# Patient Record
Sex: Female | Born: 1940 | Race: White | Hispanic: No
Health system: Southern US, Community
[De-identification: ages and names within clinical notes are randomized; demographics above are authoritative.]

## PROBLEM LIST (undated history)

## (undated) DIAGNOSIS — C50919 Malignant neoplasm of unspecified site of unspecified female breast: Secondary | ICD-10-CM

## (undated) DIAGNOSIS — K219 Gastro-esophageal reflux disease without esophagitis: Secondary | ICD-10-CM

## (undated) DIAGNOSIS — I499 Cardiac arrhythmia, unspecified: Secondary | ICD-10-CM

## (undated) DIAGNOSIS — J849 Interstitial pulmonary disease, unspecified: Secondary | ICD-10-CM

## (undated) DIAGNOSIS — I4819 Other persistent atrial fibrillation: Secondary | ICD-10-CM

## (undated) DIAGNOSIS — E785 Hyperlipidemia, unspecified: Secondary | ICD-10-CM

## (undated) DIAGNOSIS — Z87440 Personal history of urinary (tract) infections: Secondary | ICD-10-CM

## (undated) DIAGNOSIS — I10 Essential (primary) hypertension: Secondary | ICD-10-CM

## (undated) DIAGNOSIS — Z8679 Personal history of other diseases of the circulatory system: Secondary | ICD-10-CM

## (undated) DIAGNOSIS — I7300 Raynaud's syndrome without gangrene: Secondary | ICD-10-CM

## (undated) DIAGNOSIS — D509 Iron deficiency anemia, unspecified: Secondary | ICD-10-CM

## (undated) DIAGNOSIS — M4800 Spinal stenosis, site unspecified: Secondary | ICD-10-CM

## (undated) DIAGNOSIS — Z77090 Contact with and (suspected) exposure to asbestos: Secondary | ICD-10-CM

## (undated) DIAGNOSIS — J309 Allergic rhinitis, unspecified: Secondary | ICD-10-CM

## (undated) DIAGNOSIS — Z8719 Personal history of other diseases of the digestive system: Secondary | ICD-10-CM

## (undated) DIAGNOSIS — R0600 Dyspnea, unspecified: Secondary | ICD-10-CM

## (undated) HISTORY — DX: Gastro-esophageal reflux disease without esophagitis: K21.9

## (undated) HISTORY — PX: APPENDECTOMY: SHX54

## (undated) HISTORY — PX: OTHER SURGICAL HISTORY: SHX169

## (undated) HISTORY — PX: TONSILLECTOMY: SUR1361

## (undated) HISTORY — PX: GANGLION CYST EXCISION: SHX1691

## (undated) HISTORY — PX: HEMORRHOID SURGERY: SHX153

## (undated) HISTORY — DX: Allergic rhinitis, unspecified: J30.9

---

## 2005-04-10 HISTORY — PX: BREAST LUMPECTOMY: SHX2

## 2005-04-10 HISTORY — PX: BREAST SURGERY: SHX581

## 2007-04-11 HISTORY — PX: ABLATION: SHX5711

## 2010-10-06 HISTORY — PX: OTHER SURGICAL HISTORY: SHX169

## 2014-06-11 ENCOUNTER — Other Ambulatory Visit: Payer: Self-pay

## 2014-06-11 DIAGNOSIS — Z1231 Encounter for screening mammogram for malignant neoplasm of breast: Secondary | ICD-10-CM

## 2014-06-11 DIAGNOSIS — Z9889 Other specified postprocedural states: Secondary | ICD-10-CM

## 2014-06-15 ENCOUNTER — Telehealth: Payer: Self-pay | Admitting: *Deleted

## 2014-06-15 NOTE — Telephone Encounter (Signed)
Called and confirmed 06/25/14 appt w/ pt.  Mailed before appt letter, calendar, welcoming packet & intake form to pt.  Called Tammy at referring to make her aware.  Placed chart in Dr. Geralyn Flash box and took a copy of the records to HIM to scan.

## 2014-06-18 ENCOUNTER — Encounter (INDEPENDENT_AMBULATORY_CARE_PROVIDER_SITE_OTHER): Payer: Self-pay

## 2014-06-18 ENCOUNTER — Ambulatory Visit
Admission: RE | Admit: 2014-06-18 | Discharge: 2014-06-18 | Disposition: A | Discharge status: Home / Self Care | Payer: Medicare Other | Source: Ambulatory Visit

## 2014-06-18 DIAGNOSIS — Z1231 Encounter for screening mammogram for malignant neoplasm of breast: Secondary | ICD-10-CM

## 2014-06-18 DIAGNOSIS — Z9889 Other specified postprocedural states: Secondary | ICD-10-CM

## 2014-06-25 ENCOUNTER — Ambulatory Visit: Payer: Medicare Other

## 2014-06-25 ENCOUNTER — Ambulatory Visit: Payer: Self-pay | Admitting: Hematology and Oncology

## 2014-06-25 ENCOUNTER — Ambulatory Visit (HOSPITAL_BASED_OUTPATIENT_CLINIC_OR_DEPARTMENT_OTHER): Payer: Medicare Other | Admitting: Hematology and Oncology

## 2014-06-25 ENCOUNTER — Telehealth: Payer: Self-pay | Admitting: Hematology and Oncology

## 2014-06-25 ENCOUNTER — Ambulatory Visit: Payer: Self-pay

## 2014-06-25 VITALS — BP 141/70 | HR 75 | Temp 97.6°F | Resp 18 | Ht 64.0 in | Wt 166.9 lb

## 2014-06-25 DIAGNOSIS — Z853 Personal history of malignant neoplasm of breast: Secondary | ICD-10-CM

## 2014-06-25 DIAGNOSIS — C50412 Malignant neoplasm of upper-outer quadrant of left female breast: Secondary | ICD-10-CM | POA: Insufficient documentation

## 2014-06-25 DIAGNOSIS — C50912 Malignant neoplasm of unspecified site of left female breast: Secondary | ICD-10-CM

## 2014-06-25 NOTE — Telephone Encounter (Signed)
Gave patient avs report and appointment for march 2017

## 2014-06-25 NOTE — Addendum Note (Signed)
Addended by: Renford Dills on: 06/25/2014 01:43 PM   Modules accepted: Medications

## 2014-06-25 NOTE — Assessment & Plan Note (Signed)
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 Currently on observation  Breast cancer surveillance: 1. Breast exam 06/25/2014 is normal 2. Mammogram 06/19/2014 is normal  Survivorship:Discussed the importance of physical exercise in decreasing the likelihood of breast cancer recurrence. Recommended 30 mins daily 6 days a week of either brisk walking or cycling or swimming. Encouraged patient to eat more fruits and vegetables and decrease red meat.   Return to clinic in 1 year for follow-up

## 2014-06-25 NOTE — Progress Notes (Signed)
[facility] CONSULT NOTE  Patient Care Team: No Pcp Per Patient as PCP - General (General Practice)  CHIEF COMPLAINTS/PURPOSE OF CONSULTATION:  Prior history of left breast cancer. Establish care in [name]:  Karina Tran 74 y.o. female is here because of a prior diagnosis of left breast cancer in December 2006 she underwent lumpectomy followed by radiation and 5 years of hormonal therapy. She undergone this treatment in [place]. Her family moved to this area and she wanted to see [place] to establish oncology care. She undergone a mammogram on March 11 which was normal. She reports no new problems or concerns with the breast.  I reviewed her records extensively and collaborated the history with the patient.  SUMMARY OF ONCOLOGIC HISTORY:   Breast cancer, left breast   03/24/2005 Surgery Left breast lumpectomy 1.5cm tumor, grade 2, 2 sentinel nodes negative, ER 83% PR 0% HER-2 negative ratio 0.7, Ki-67 0% T1c N0 M0 stage IA   04/26/2005 - 06/02/2005 Radiation Therapy Adjuvant radiation therapy   06/26/2005 - 06/24/2010 Anti-estrogen oral therapy Adjuvant antiestrogen therapy with Arimidex 1 mg daily 5 years    In terms of breast cancer risk profile:  She menarched at early age of 35 and went to menopause at age 13  She had 3 pregnancy, her first child was born at age 14  She has not received birth control pills.  She was never exposed to fertility medications or hormone replacement therapy.  She has no family history of Breast/GYN/GI cancer  MEDICAL HISTORY:  No past medical history on file.  SURGICAL HISTORY: No past surgical history on file.  SOCIAL HISTORY: History   Social History  . Marital Status: Widowed    Spouse Name: N/A  . Number of Children: N/A  . Years of Education: N/A   Occupational History  . Not on file.   Social History Main Topics  . Smoking status: Not on file  . Smokeless tobacco: Not on file   . Alcohol Use: Not on file  . Drug Use: Not on file  . Sexual Activity: Not on file   Other Topics Concern  . Not on file   Social History Narrative  . No narrative on file    FAMILY HISTORY: No family history on file.  ALLERGIES:  has no allergies on file.  MEDICATIONS:  No current outpatient prescriptions on file.   No current facility-administered medications for this visit.    REVIEW OF SYSTEMS:   Constitutional: Denies fevers, chills or abnormal night sweats Eyes: Denies blurriness of vision, double vision or watery eyes Ears, nose, mouth, throat, and face: Denies mucositis or sore throat Respiratory: Denies cough, dyspnea or wheezes Cardiovascular: Denies palpitation, chest discomfort or lower extremity swelling Gastrointestinal:  Denies nausea, heartburn or change in bowel habits Skin: Denies abnormal skin rashes Lymphatics: Denies new lymphadenopathy or easy bruising Neurological:Denies numbness, tingling or new weaknesses Behavioral/Psych: Mood is stable, no new changes  Breast:  Denies any palpable lumps or discharge All other systems were reviewed with the patient and are negative.  PHYSICAL EXAMINATION: ECOG PERFORMANCE STATUS: 0 - Asymptomatic  Filed Vitals:   06/25/14 1256  BP: 141/70  Pulse: 75  Temp: 97.6 F (36.4 C)  Resp: 18   Filed Weights   06/25/14 1256  Weight: 166 lb 14.4 oz (75.705 kg)    GENERAL:alert, no distress and comfortable SKIN: skin color, texture, turgor are normal, no rashes or significant lesions EYES: normal,  conjunctiva are pink and non-injected, sclera clear OROPHARYNX:no exudate, no erythema and lips, buccal mucosa, and tongue normal  NECK: supple, thyroid normal size, non-tender, without nodularity LYMPH:  no palpable lymphadenopathy in the cervical, axillary or inguinal LUNGS: clear to auscultation and percussion with normal breathing effort HEART: regular rate & rhythm and no murmurs and no lower extremity  edema ABDOMEN:abdomen soft, non-tender and normal bowel sounds Musculoskeletal:no cyanosis of digits and no clubbing  PSYCH: alert & oriented x 3 with fluent speech NEURO: no focal motor/sensory deficits BREAST: No palpable nodules in breast. No palpable axillary or supraclavicular lymphadenopathy (exam performed in the presence of a chaperone)   RADIOGRAPHIC STUDIES: I have personally reviewed the radiological reports and agreed with the findings in the report. 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 Currently on observation  Breast cancer surveillance: 1. Breast exam 06/25/2014 is normal 2. Mammogram 06/19/2014 is normal 3. I discussed with her that there is no role of routine imaging on routine blood work in surveillance for breast cancer based on the NCCN guidelines.  Survivorship:Discussed the importance of physical exercise in decreasing the likelihood of breast cancer recurrence. Recommended 30 mins daily 6 days a week of either brisk walking or cycling or swimming. Encouraged patient to eat more fruits and vegetables and decrease red meat.   Return to clinic in 1 year for follow-up  All questions were answered. The patient knows to call the clinic with any problems, questions or concerns.    Rulon Eisenmenger, MD 1:33 PM

## 2014-11-16 ENCOUNTER — Encounter (HOSPITAL_COMMUNITY): Payer: Self-pay | Admitting: Emergency Medicine

## 2014-11-16 ENCOUNTER — Inpatient Hospital Stay (HOSPITAL_COMMUNITY)
Admission: EM | Admit: 2014-11-16 | Discharge: 2014-11-18 | DRG: 310 | Disposition: A | Discharge status: Home / Self Care | Payer: Medicare Other | Attending: Cardiology | Admitting: Cardiology

## 2014-11-16 ENCOUNTER — Emergency Department (HOSPITAL_COMMUNITY): Payer: Medicare Other

## 2014-11-16 DIAGNOSIS — Z885 Allergy status to narcotic agent status: Secondary | ICD-10-CM

## 2014-11-16 DIAGNOSIS — I959 Hypotension, unspecified: Secondary | ICD-10-CM | POA: Diagnosis present

## 2014-11-16 DIAGNOSIS — I10 Essential (primary) hypertension: Secondary | ICD-10-CM | POA: Diagnosis present

## 2014-11-16 DIAGNOSIS — I4891 Unspecified atrial fibrillation: Secondary | ICD-10-CM | POA: Diagnosis present

## 2014-11-16 DIAGNOSIS — I4892 Unspecified atrial flutter: Secondary | ICD-10-CM | POA: Diagnosis present

## 2014-11-16 DIAGNOSIS — E785 Hyperlipidemia, unspecified: Secondary | ICD-10-CM | POA: Diagnosis present

## 2014-11-16 DIAGNOSIS — Z888 Allergy status to other drugs, medicaments and biological substances status: Secondary | ICD-10-CM

## 2014-11-16 DIAGNOSIS — Z853 Personal history of malignant neoplasm of breast: Secondary | ICD-10-CM

## 2014-11-16 DIAGNOSIS — Z79899 Other long term (current) drug therapy: Secondary | ICD-10-CM

## 2014-11-16 HISTORY — DX: Essential (primary) hypertension: I10

## 2014-11-16 HISTORY — DX: Malignant neoplasm of unspecified site of unspecified female breast: C50.919

## 2014-11-16 HISTORY — DX: Other persistent atrial fibrillation: I48.19

## 2014-11-16 HISTORY — DX: Personal history of urinary (tract) infections: Z87.440

## 2014-11-16 LAB — CBC
HEMATOCRIT: 38.2 % (ref 36.0–46.0)
Hemoglobin: 12.3 g/dL (ref 12.0–15.0)
MCH: 25.2 pg — AB (ref 26.0–34.0)
MCHC: 32.2 g/dL (ref 30.0–36.0)
MCV: 78.3 fL (ref 78.0–100.0)
Platelets: 256 10*3/uL (ref 150–400)
RBC: 4.88 MIL/uL (ref 3.87–5.11)
RDW: 18.4 % — ABNORMAL HIGH (ref 11.5–15.5)
WBC: 9.5 10*3/uL (ref 4.0–10.5)

## 2014-11-16 LAB — BASIC METABOLIC PANEL
Anion gap: 13 (ref 5–15)
CO2: 23 mmol/L (ref 22–32)
Calcium: 9.5 mg/dL (ref 8.9–10.3)
Chloride: 96 mmol/L — ABNORMAL LOW (ref 101–111)
Creatinine, Ser: 0.97 mg/dL (ref 0.44–1.00)
GFR, EST NON AFRICAN AMERICAN: 56 mL/min — AB (ref >60)
GLUCOSE: 125 mg/dL — AB (ref 65–99)
Potassium: 3.5 mmol/L (ref 3.5–5.1)
Sodium: 132 mmol/L — ABNORMAL LOW (ref 135–145)

## 2014-11-16 MED ORDER — DILTIAZEM LOAD VIA INFUSION
20.0000 mg | Freq: Once | INTRAVENOUS | Status: AC
  Administered 2014-11-17: 20 mg via INTRAVENOUS
  Filled 2014-11-16: qty 20

## 2014-11-16 MED ORDER — DILTIAZEM HCL 100 MG IV SOLR
5.0000 mg/h | INTRAVENOUS | Status: DC
  Administered 2014-11-17: 5 mg/h via INTRAVENOUS
  Filled 2014-11-16 (×2): qty 100

## 2014-11-16 NOTE — ED Notes (Signed)
EDP at bedside  

## 2014-11-16 NOTE — ED Notes (Signed)
Pt was waiting in the waiting room with a family member when she suddenly became very sob. Pt with hx of a-fib. Takes medication for this. Pt presents in a-flutter rate in the 120's. Denies cp.

## 2014-11-16 NOTE — ED Provider Notes (Signed)
CSN: [id number]     Arrival date & time 11/16/14  2223 History   This chart was scribed for Karina Fuel, MD by Forrestine Him, ED Scribe. This patient was seen in room D33C/D33C and the patient's care was started 11:29 PM.   Chief Complaint  Patient presents with  . Atrial Fibrillation   The history is provided by the patient. No language interpreter was used.    HPI Comments: Karina Tran is a 74 y.o. female with a PMHx of A-Fib, HTN, and breast cancer who presents to the Emergency Department here for Atrial Fibrillation this evening. She reports constant, ongoing shortness of breath that is worsened at rest and with movement, mild chest pressure, and feeling as through "my chest is jumping out of my chest" onset 2 hours. She admits to a history of same but denies any recent episodes of A-Fib. No recent fever, chills, nausea, or vomiting. Last episode of A-Fib required cardioversion which put her right back into rhythm. In addition, PSHx includes cardiac ablation. Karina Tran is currently on daily medications for A-Fib. She is also followed by Dr. Einar Gip. Pt with known allergies to ASA and Demerol.  Past Medical History  Diagnosis Date  . A-fib   . Hypertension   . S/P ablation of atrial fibrillation   . Cancer     Breast    Past Surgical History  Procedure Laterality Date  . Appendectomy    . Cesarean section    . Carpel tunnel     No family history on file. History  Substance Use Topics  . Smoking status: Never Smoker   . Smokeless tobacco: Not on file  . Alcohol Use: Yes   OB History    No data available     Review of Systems  Constitutional: Negative for fever and chills.  Respiratory: Positive for shortness of breath. Negative for cough.   Cardiovascular: Positive for chest pain.  Gastrointestinal: Negative for nausea, vomiting and abdominal pain.  Neurological: Negative for headaches.  Psychiatric/Behavioral: Negative for confusion.  All other systems reviewed  and are negative.     Allergies  Aspirin and Demerol  Home Medications   Prior to Admission medications   Medication Sig Start Date End Date Taking? Authorizing Provider  amLODipine (NORVASC) 2.5 MG tablet Take 2.5 mg by mouth daily.    Historical Provider, MD  atenolol (TENORMIN) 50 MG tablet Take 50 mg by mouth daily.    Historical Provider, MD  Coenzyme Q10 (COQ10) 200 MG CAPS Take by mouth daily.    Historical Provider, MD  hydrochlorothiazide (HYDRODIURIL) 25 MG tablet Take 25 mg by mouth daily.    Historical Provider, MD  pantoprazole (PROTONIX) 40 MG tablet Take 40 mg by mouth daily.    Historical Provider, MD  pravastatin (PRAVACHOL) 10 MG tablet Take 10 mg by mouth daily.    Historical Provider, MD  UNABLE TO FIND daily. Calcium 600 mg with Vitamin D3    Historical Provider, MD   Triage Vitals: BP 140/87 mmHg  Pulse 125  Temp(Src) 97.5 F (36.4 C)  Resp 20  Ht 5\' 3"  (1.6 m)  Wt 156 lb 9.6 oz (71.033 kg)  BMI 27.75 kg/m2  SpO2 100%   Physical Exam  Constitutional: She is oriented to person, place, and time. She appears well-developed and well-nourished.  HENT:  Head: Normocephalic and atraumatic.  Eyes: EOM are normal. Pupils are equal, round, and reactive to light.  Neck: Normal range of motion. Neck supple. No  JVD present.  Cardiovascular: Regular rhythm and normal heart sounds.  Tachycardia present.   No murmur heard. Pulmonary/Chest: Effort normal and breath sounds normal. She has no wheezes. She has no rales. She exhibits no tenderness.  Abdominal: Soft. Bowel sounds are normal. She exhibits no distension and no mass. There is no tenderness.  Musculoskeletal: Normal range of motion. She exhibits no edema.  Lymphadenopathy:    She has no cervical adenopathy.  Neurological: She is alert and oriented to person, place, and time. No cranial nerve deficit. She exhibits normal muscle tone. Coordination normal.  Skin: Skin is warm and dry. No rash noted.   Psychiatric: She has a normal mood and affect. Her behavior is normal. Judgment and thought content normal.  Nursing note and vitals reviewed.   ED Course  Procedures (including critical care time)  DIAGNOSTIC STUDIES: Oxygen Saturation is 100% on RA, Normal by my interpretation.    COORDINATION OF CARE: 11:38 PM- Will give Cardizem drip. Will order CXR, Troponin I, BMP, and CBC. Discussed treatment plan with pt at bedside and pt agreed to plan.     Labs Review Results for orders placed or performed during the hospital encounter of 38/75/64  Basic metabolic panel  Result Value Ref Range   Sodium 132 (L) 135 - 145 mmol/L   Potassium 3.5 3.5 - 5.1 mmol/L   Chloride 96 (L) 101 - 111 mmol/L   CO2 23 22 - 32 mmol/L   Glucose, Bld 125 (H) 65 - 99 mg/dL   BUN <5 (L) 6 - 20 mg/dL   Creatinine, Ser 0.97 0.44 - 1.00 mg/dL   Calcium 9.5 8.9 - 10.3 mg/dL   GFR calc non Af Amer 56 (L) >60 mL/min   GFR calc Af Amer >60 >60 mL/min   Anion gap 13 5 - 15  CBC  Result Value Ref Range   WBC 9.5 4.0 - 10.5 K/uL   RBC 4.88 3.87 - 5.11 MIL/uL   Hemoglobin 12.3 12.0 - 15.0 g/dL   HCT 38.2 36.0 - 46.0 %   MCV 78.3 78.0 - 100.0 fL   MCH 25.2 (L) 26.0 - 34.0 pg   MCHC 32.2 30.0 - 36.0 g/dL   RDW 18.4 (H) 11.5 - 15.5 %   Platelets 256 150 - 400 K/uL  Troponin I  Result Value Ref Range   Troponin I <0.03 <0.031 ng/mL   Imaging Review Dg Chest 2 View  11/16/2014   CLINICAL DATA:  Acute onset of shortness of breath. Initial encounter.  EXAM: CHEST  2 VIEW  COMPARISON:  None.  FINDINGS: The lungs are well-aerated. Mild left basilar opacity may reflect atelectasis or mild pneumonia. Peribronchial thickening is seen. There is no evidence of pleural effusion or pneumothorax.  The heart is mildly enlarged. No acute osseous abnormalities are seen.  IMPRESSION: Mild left basilar opacity may reflect atelectasis or mild pneumonia. Peribronchial thickening seen. Mild cardiomegaly.   Electronically Signed   By:  Garald Balding M.D.   On: 11/16/2014 23:31     EKG Interpretation   Date/Time:  Monday November 16 2014 22:31:26 EDT Ventricular Rate:  127 PR Interval:    QRS Duration: 80 QT Interval:  330 QTC Calculation: 479 R Axis:   84 Text Interpretation:  Atrial flutter with variable A-V block with  premature ventricular or aberrantly conducted complexes Nonspecific ST  abnormality Abnormal ECG No old tracing to compare Confirmed by [facility]  MD,  Anokhi Shannon ([id number]) on 11/16/2014 11:25:34 PM  CRITICAL CARE Performed by: Karina Tran Total critical care time: 50 minutes Critical care time was exclusive of separately billable procedures and treating other patients. Critical care was necessary to treat or prevent imminent or life-threatening deterioration. Critical care was time spent personally by me on the following activities: development of treatment plan with patient and/or surrogate as well as nursing, discussions with consultants, evaluation of patient's response to treatment, examination of patient, obtaining history from patient or surrogate, ordering and performing treatments and interventions, ordering and review of laboratory studies, ordering and review of radiographic studies, pulse oximetry and re-evaluation of patient's condition.  MDM   Final diagnoses:  Atrial flutter with rapid ventricular response    Atrial flutter with rapid ventricular response. She was given diltiazem bolus and drip with rate slowing to the 80s and what appeared to be alternating 2:1 and 3:1 AV block. However, she continued to complain of chest discomfort in spite of the rate dropping. Case was discussed with Dr. Einar Gip who agrees to admit the patient and requests that she be started on rivaroxaban  I personally performed the services described in this documentation, which was scribed in my presence. The recorded information has been reviewed and is accurate.     Karina Fuel, MD 07/68/08 [id number]

## 2014-11-17 ENCOUNTER — Encounter (HOSPITAL_COMMUNITY): Payer: Self-pay | Admitting: General Practice

## 2014-11-17 DIAGNOSIS — I4892 Unspecified atrial flutter: Secondary | ICD-10-CM | POA: Diagnosis present

## 2014-11-17 LAB — TROPONIN I
Troponin I: <0.03 ng/mL (ref <0.031)
Troponin I: <0.03 ng/mL (ref <0.031)

## 2014-11-17 LAB — TSH: TSH: 2.275 u[IU]/mL (ref 0.350–4.500)

## 2014-11-17 MED ORDER — ZOLPIDEM TARTRATE 5 MG PO TABS
5.0000 mg | ORAL_TABLET | Freq: Every evening | ORAL | Status: DC | PRN
  Administered 2014-11-17: 5 mg via ORAL
  Filled 2014-11-17: qty 1

## 2014-11-17 MED ORDER — LORAZEPAM 2 MG/ML IJ SOLN
0.5000 mg | Freq: Once | INTRAMUSCULAR | Status: DC
  Filled 2014-11-17: qty 1

## 2014-11-17 MED ORDER — SODIUM CHLORIDE 0.9 % IV SOLN
250.0000 mL | INTRAVENOUS | Status: DC
  Administered 2014-11-17: 250 mL via INTRAVENOUS
  Administered 2014-11-18: 500 mL via INTRAVENOUS

## 2014-11-17 MED ORDER — SODIUM CHLORIDE 0.9 % IJ SOLN
3.0000 mL | INTRAMUSCULAR | Status: DC | PRN
  Administered 2014-11-17: 3 mL via INTRAVENOUS

## 2014-11-17 MED ORDER — RIVAROXABAN 20 MG PO TABS
20.0000 mg | ORAL_TABLET | Freq: Once | ORAL | Status: AC
  Administered 2014-11-17: 20 mg via ORAL
  Filled 2014-11-17: qty 1

## 2014-11-17 MED ORDER — ATENOLOL 50 MG PO TABS
50.0000 mg | ORAL_TABLET | Freq: Two times a day (BID) | ORAL | Status: DC
Start: 2014-11-17 — End: 2014-11-18
  Administered 2014-11-17 – 2014-11-18 (×3): 50 mg via ORAL
  Filled 2014-11-17 (×4): qty 1

## 2014-11-17 MED ORDER — RIVAROXABAN 20 MG PO TABS
20.0000 mg | ORAL_TABLET | Freq: Every day | ORAL | Status: DC
  Administered 2014-11-17 – 2014-11-18 (×2): 20 mg via ORAL
  Filled 2014-11-17 (×2): qty 1

## 2014-11-17 MED ORDER — NITROGLYCERIN 0.4 MG SL SUBL: 0.4000 mg | SUBLINGUAL_TABLET | SUBLINGUAL | Status: DC | PRN

## 2014-11-17 MED ORDER — SODIUM CHLORIDE 0.9 % IJ SOLN
3.0000 mL | Freq: Two times a day (BID) | INTRAMUSCULAR | Status: DC
  Administered 2014-11-17 – 2014-11-18 (×3): 3 mL via INTRAVENOUS

## 2014-11-17 MED ORDER — PANTOPRAZOLE SODIUM 40 MG PO TBEC
40.0000 mg | DELAYED_RELEASE_TABLET | Freq: Every day | ORAL | Status: DC
  Administered 2014-11-17 – 2014-11-18 (×2): 40 mg via ORAL
  Filled 2014-11-17 (×2): qty 1

## 2014-11-17 MED ORDER — PRAVASTATIN SODIUM 10 MG PO TABS
10.0000 mg | ORAL_TABLET | Freq: Every day | ORAL | Status: DC
  Administered 2014-11-17 – 2014-11-18 (×2): 10 mg via ORAL
  Filled 2014-11-17 (×2): qty 1

## 2014-11-17 MED ORDER — FLECAINIDE ACETATE 50 MG PO TABS
50.0000 mg | ORAL_TABLET | Freq: Two times a day (BID) | ORAL | Status: DC
  Administered 2014-11-17 – 2014-11-18 (×3): 50 mg via ORAL
  Filled 2014-11-17 (×5): qty 1

## 2014-11-17 NOTE — ED Notes (Signed)
Meal tray ordered 

## 2014-11-17 NOTE — ED Notes (Signed)
Requested medication from pharmacy.

## 2014-11-17 NOTE — ED Notes (Signed)
Pt ambulated to the restroom.

## 2014-11-17 NOTE — ED Notes (Signed)
ED Doctor at bedside.  

## 2014-11-17 NOTE — ED Notes (Signed)
Sent med request to pharmacy.

## 2014-11-17 NOTE — ED Notes (Signed)
Pt given menu to order breakfasat; pt unable to sleep or rest even after [place]

## 2014-11-17 NOTE — H&P (Addendum)
Karina Tran is an 74 y.o. female.   Chief Complaint: A.flutter HPI: Karina Tran is a 74 y.o. female with a history of a.fib s/p ablation in 2012, hypertension, and hyperlipidemia who presented to the ED last night with symptoms suggestive of recurrent a.fib.  She reported extreme fatigue, dyspnea with just minimal exertion, and heart pounding. Symptoms started at around 5 PM yesterday, with debris physical activity she had to rest. Last evening she brought her daughter to the emergency room for severe nausea and vomiting, once she was walking her daughter into the emergency room, patient herself started feeling ill, markedly dyspneic, palpitations and dizziness associated with chest heaviness. She reports a sensation of chest heaviness but denies pain. She then presented to the emergency room for evaluation. Denies any new medications, prescription or OTC.  Pt reports mild chest heaviness which is resolved since admission presently but denies symptoms except for irregular heart rhythm.  Past Medical History  Diagnosis Date  . A-fib   . Hypertension   . S/P ablation of atrial fibrillation   . Cancer     Breast     Past Surgical History  Procedure Laterality Date  . Appendectomy    . Cesarean section    . Carpel tunnel      No family history on file. Social History:  reports that she has never smoked. She does not have any smokeless tobacco history on file. She reports that she drinks alcohol. She reports that she does not use illicit drugs.  Allergies:  Allergies  Allergen Reactions  . Aspirin     Bloody noses  . Demerol [Meperidine]     vomiting    Review of Systems - History obtained from the patient General ROS: positive for  - fatigue negative for - fever, malaise, weight gain or weight loss Respiratory ROS: positive for - shortness of breath negative for - cough or orthopnea Cardiovascular ROS: positive for - chest pain, dyspnea on exertion and rapid heart  rate negative for - edema, loss of consciousness or paroxysmal nocturnal dyspnea Neurological ROS: no TIA or stroke symptoms  Blood pressure 97/50, pulse 63, temperature 97.5 F (36.4 C), resp. rate 19, height $RemoveBe'5\' 3"'iJjreMtmT$  (1.6 m), weight 71.033 kg (156 lb 9.6 oz), SpO2 100 %. General appearance: alert, cooperative, appears stated age and no distress Eyes: negative Neck: no adenopathy, no carotid bruit, no JVD, supple, symmetrical, trachea midline and thyroid not enlarged, symmetric, no tenderness/mass/nodules Resp: clear to auscultation bilaterally Chest wall: no tenderness Cardio: regular rate and rhythm, S1, S2 normal, no murmur, click, rub or gallop S2 appears to be widely split with normal respiratory variation. GI: soft, non-tender; bowel sounds normal; no masses,  no organomegaly Extremities: extremities normal, atraumatic, no cyanosis or edema Pulses: 2+ and symmetric Skin: Skin color, texture, turgor normal. No rashes or lesions Neurologic: Grossly normal  Results for orders placed or performed during the hospital encounter of 11/16/14 (from the past 48 hour(s))  Basic metabolic panel     Status: Abnormal   Collection Time: 11/16/14 10:44 PM  Result Value Ref Range   Sodium 132 (L) 135 - 145 mmol/L   Potassium 3.5 3.5 - 5.1 mmol/L   Chloride 96 (L) 101 - 111 mmol/L   CO2 23 22 - 32 mmol/L   Glucose, Bld 125 (H) 65 - 99 mg/dL   BUN <5 (L) 6 - 20 mg/dL   Creatinine, Ser 0.97 0.44 - 1.00 mg/dL   Calcium 9.5 8.9 - 10.3  mg/dL   GFR calc non Af Amer 56 (L) >60 mL/min   GFR calc Af Amer >60 >60 mL/min    Comment: (NOTE) The eGFR has been calculated using the CKD EPI equation. This calculation has not been validated in all clinical situations. eGFR's persistently <60 mL/min signify possible Chronic Kidney Disease.    Anion gap 13 5 - 15  CBC     Status: Abnormal   Collection Time: 11/16/14 10:44 PM  Result Value Ref Range   WBC 9.5 4.0 - 10.5 K/uL   RBC 4.88 3.87 - 5.11 MIL/uL    Hemoglobin 12.3 12.0 - 15.0 g/dL   HCT 38.2 36.0 - 46.0 %   MCV 78.3 78.0 - 100.0 fL   MCH 25.2 (L) 26.0 - 34.0 pg   MCHC 32.2 30.0 - 36.0 g/dL   RDW 18.4 (H) 11.5 - 15.5 %   Platelets 256 150 - 400 K/uL  Troponin I     Status: None   Collection Time: 11/17/14 12:12 AM  Result Value Ref Range   Troponin I <0.03 <0.031 ng/mL    Comment:        NO INDICATION OF MYOCARDIAL INJURY.    Dg Chest 2 View  11/16/2014   CLINICAL DATA:  Acute onset of shortness of breath. Initial encounter.  EXAM: CHEST  2 VIEW  COMPARISON:  None.  FINDINGS: The lungs are well-aerated. Mild left basilar opacity may reflect atelectasis or mild pneumonia. Peribronchial thickening is seen. There is no evidence of pleural effusion or pneumothorax.  The heart is mildly enlarged. No acute osseous abnormalities are seen.  IMPRESSION: Mild left basilar opacity may reflect atelectasis or mild pneumonia. Peribronchial thickening seen. Mild cardiomegaly.   Electronically Signed   By: Garald Balding M.D.   On: 11/16/2014 23:31    Labs:   Lab Results  Component Value Date   WBC 9.5 11/16/2014   HGB 12.3 11/16/2014   HCT 38.2 11/16/2014   MCV 78.3 11/16/2014   PLT 256 11/16/2014    Recent Labs Lab 11/16/14 2244  NA 132*  K 3.5  CL 96*  CO2 23  BUN <5*  CREATININE 0.97  CALCIUM 9.5  GLUCOSE 125*    Recent Labs  11/17/14 0012  TROPONINI <0.03    Lab Results  Component Value Date   TROPONINI <0.03 11/17/2014     EKG 11/16/2014 at 2231: A.flutter with ventricular rate of 127, PVC, PRWP  Outpatient Echocardiogram: 07/14/2014 1. Left ventricle cavity is normal in size. Mild concentric hypertrophy of the left ventricle. Normal global wall motion. Calculated EF 58%. 2. Left atrial cavity is mildly dilated. 3. Moderate aortic regurgitation. 4. Mild to moderate mitral regurgitation. Mild calcification of the mitral valve annulus. Mild mitral valve leaflet thickening. 5. Mild tricuspid regurgitation. No  evidence of pulmonary hypertension. 6. Tiny pericardial effusion.  OutpatientTreadmill Exercise Stress 06/29/2014: Indications: Screening for CAD. H/O A. FIbrillation.  Conclusions: Negative for ischemia. Normal BP response. The patient exercised according to the Bruce protocol, Total time recorded    4    Min.    15    sec.  achieving a max heart rate of 154  which was 105% of MPHR for age and  7.3 METS of work. Baseline NIBP was . Peak NIBP was 0/0 MaxSysp was: 166 MaxDiasp was: 60. The baseline ECG showed NSR,Normal ECG. During exercise there was No ST-T changes of ischemia. Symptoms: MPHR (100%) achieved, dyspnea. Arrhythmia: None.  Continue primary prevention.   Current  facility-administered medications:  .  [COMPLETED] diltiazem (CARDIZEM) 1 mg/mL load via infusion 20 mg, 20 mg, Intravenous, Once, 20 mg at 11/17/14 0040 **AND** diltiazem (CARDIZEM) 100 mg in dextrose 5 % 100 mL (1 mg/mL) infusion, 5-15 mg/hr, Intravenous, Continuous, Delora Fuel, MD, Last Rate: 10 mL/hr at 11/17/14 0232, 10 mg/hr at 11/17/14 0232 .  nitroGLYCERIN (NITROSTAT) SL tablet 0.4 mg, 0.4 mg, Sublingual, Q5 min PRN, Delora Fuel, MD .  zolpidem [facility]) tablet 5 mg, 5 mg, Oral, QHS PRN, Delora Fuel, MD, 5 mg at 23/53/61 0303  Current outpatient prescriptions:  .  amLODipine (NORVASC) 2.5 MG tablet, Take 2.5 mg by mouth daily., Disp: , Rfl:  .  atenolol (TENORMIN) 50 MG tablet, Take 50 mg by mouth daily., Disp: , Rfl:  .  Coenzyme Q10 (COQ10) 200 MG CAPS, Take 1 tablet by mouth daily. , Disp: , Rfl:  .  hydrochlorothiazide (HYDRODIURIL) 25 MG tablet, Take 25 mg by mouth daily., Disp: , Rfl:  .  pantoprazole (PROTONIX) 40 MG tablet, Take 40 mg by mouth daily., Disp: , Rfl:  .  pravastatin (PRAVACHOL) 10 MG tablet, Take 10 mg by mouth daily., Disp: , Rfl:  .  UNABLE TO FIND, Take 1 tablet by mouth daily. Calcium 600 mg with Vitamin D3, Disp: , Rfl:   Assessment/Plan 1. 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  HAS-Bled score is 2 and estimated major bleeding in one year is 1.88-3.2%  2. S/P Radiofrequency ablation for a.fib in [place] (10/06/2010) 3. Benign essential hypertension  Recommendation: Cardizem gtt discontinued due to bradycardia and hypotension.    Rachel Bo, NP-C 11/17/2014, 8:09 AM [facility] [place] Pager: [phone] Office: [phone]  Patient has had that was this morning, I will admit the patient to the hospital for observation and also get EP consultation to see whether proceeding with cardio version tomorrow as symptoms started last evening at 5 PM, atrial flutter duration less than 24 hours. Also consideration for EP ablation needs to be kept in mind. Patient has been free of A. fib recurrence, has not been on anticoagulation as per previous recommendation, however she has significant cardio embolic risks that she probably needs to be on long-term anticoagulation. However I will request EP to give recommendations regarding this also. I plan on performing cardio version tomorrow morning unless otherwise stated by EP. I have discussed these issues with the patient, she is agreeable. She is presently asymptomatic except she feels palpitations. I have started her on Xarelto as of last evening.

## 2014-11-17 NOTE — ED Notes (Signed)
Patient requested medication to help her sleep.

## 2014-11-17 NOTE — Consult Note (Signed)
ELECTROPHYSIOLOGY CONSULT NOTE    Patient ID: Karina Tran MRN: [id number], DOB/AGE: Oct 19, 1940 74 y.o.  Admit date: 11/16/2014 Date of Consult: 11/18/2014  Primary Physician: Curly Rim, MD Primary Cardiologist: Einar Gip  Reason for Consultation: atrial arrhythmias  HPI:  Karina Tran is a 74 y.o. female with a past medical history of hypertension, breast cancer, and persistent atrial fibrillation status post ablation around 2009 in [place].  She was first diagnosed with atrial fibrillation in the early 2000's and had multiple admissions.  She underwent PVI by report around 2009 and has done very well since that time. She thinks she has been having paroxysms of atrial arrhythmias that self terminate over the last several months.  On the day of admission, she developed fatigue and shortness of breath and presented to the hospital for evaluation.  She was found to be in atypical atrial flutter with RVR and was admitted with plans for DCCV later today.  She has been placed on Xarelto and Flecainide.  EP has been asked to evaluate for treatment options.   Prior to admission, she did not have chest pain, shortness of breath, recent fevers, chills, nausea or vomiting. She has not had syncope, pre-syncope or dizziness.   Echo 07/2014 demonstrated EF 58%, mild to moderate MR, LA mildly dilated, mild TR.   Past Medical History  Diagnosis Date  . Persistent atrial fibrillation     a. s/p PVI 2009 in [place]  . Hypertension   . Breast cancer        . Complication of anesthesia     " TAKES ALONG TIME TO WAKE ME "  . History of kidney infection      Surgical History:  Past Surgical History  Procedure Laterality Date  . Appendectomy    . Cesarean section    . Carpel tunnel    . Breast surgery      partial mastectomy  . Ablation  2009    PVI in [place]     Prescriptions prior to admission  Medication Sig Dispense Refill Last Dose  . amLODipine (NORVASC) 2.5 MG tablet Take 2.5 mg by  mouth daily.   11/16/2014 at Unknown time  . atenolol (TENORMIN) 50 MG tablet Take 50 mg by mouth daily.   11/16/2014 at pt does not remeber   . Coenzyme Q10 (COQ10) 200 MG CAPS Take 1 tablet by mouth daily.    11/16/2014 at Unknown time  . hydrochlorothiazide (HYDRODIURIL) 25 MG tablet Take 25 mg by mouth daily.   11/16/2014 at Unknown time  . pantoprazole (PROTONIX) 40 MG tablet Take 40 mg by mouth daily.   11/16/2014 at Unknown time  . pravastatin (PRAVACHOL) 10 MG tablet Take 10 mg by mouth daily.   11/16/2014 at Unknown time  . UNABLE TO FIND Take 1 tablet by mouth daily. Calcium 600 mg with Vitamin D3   11/16/2014 at Unknown time    Inpatient Medications:  . atenolol  50 mg Oral BID  . flecainide  50 mg Oral Q12H  . LORazepam  0.5 mg Intravenous Once  . pantoprazole  40 mg Oral Daily  . pravastatin  10 mg Oral Daily  . rivaroxaban  20 mg Oral Q supper  . sodium chloride  3 mL Intravenous Q12H    Allergies:  Allergies  Allergen Reactions  . Aspirin     Bloody noses  . Demerol [Meperidine]     vomiting    Social History   Social History  . Marital  Status: Widowed    Spouse Name: N/A  . Number of Children: N/A  . Years of Education: N/A   Occupational History  . Not on file.   Social History Main Topics  . Smoking status: Never Smoker   . Smokeless tobacco: Never Used  . Alcohol Use: Yes     Comment: weekley beer  . Drug Use: No  . Sexual Activity: Not on file   Other Topics Concern  . Not on file   Social History Narrative    Family History - no early CAD  Review of Systems: All other systems reviewed and are otherwise negative except as noted above.  Physical Exam: Filed Vitals:   11/17/14 1500 11/17/14 1540 11/17/14 2001 11/18/14 0420  BP: 118/68 88/68 111/70 114/62  Pulse: 79 90 118 113  Temp:  98 F (36.7 C) 97.8 F (36.6 C) 98 F (36.7 C)  TempSrc:  Oral Oral Oral  Resp: 17 18 19 20   Height:  5\' 4"  (1.626 m)    Weight:  150 lb 9.2 oz (68.3 kg)    SpO2:  100% 100% 100% 100%    GEN- The patient is well appearing, alert and oriented x 3 today.   HEENT: normocephalic, atraumatic; sclera clear, conjunctiva pink; hearing intact; oropharynx clear; neck supple  Lungs- Clear to ausculation bilaterally, normal work of breathing.  No wheezes, rales, rhonchi Heart- Tachycardic irregular rate and rhythm  GI- soft, non-tender, non-distended, bowel sounds present, no hepatosplenomegaly Extremities- no clubbing, cyanosis, or edema; DP/PT/radial pulses 2+ bilaterally MS- no significant deformity or atrophy Skin- warm and dry, no rash or lesion Psych- euthymic mood, full affect Neuro- strength and sensation are intact  Labs:   Lab Results  Component Value Date   WBC 9.5 11/16/2014   HGB 12.3 11/16/2014   HCT 38.2 11/16/2014   MCV 78.3 11/16/2014   PLT 256 11/16/2014     Recent Labs Lab 11/16/14 2244  NA 132*  K 3.5  CL 96*  CO2 23  BUN <5*  CREATININE 0.97  CALCIUM 9.5  GLUCOSE 125*      Radiology/Studies: Dg Chest 2 View 11/16/2014   CLINICAL DATA:  Acute onset of shortness of breath. Initial encounter.  EXAM: CHEST  2 VIEW  COMPARISON:  None.  FINDINGS: The lungs are well-aerated. Mild left basilar opacity may reflect atelectasis or mild pneumonia. Peribronchial thickening is seen. There is no evidence of pleural effusion or pneumothorax.  The heart is mildly enlarged. No acute osseous abnormalities are seen.  IMPRESSION: Mild left basilar opacity may reflect atelectasis or mild pneumonia. Peribronchial thickening seen. Mild cardiomegaly.   Electronically Signed   By: Garald Balding M.D.   On: 11/16/2014 23:31    EKG: atypical atrial flutter, ventricular rate 127   TELEMETRY: atrial flutter, atrial fibrillation, ventricular rates 90-120's  Assessment/Plan: 1.  Atrial arrhythmias The patient has recurrent atrial arrhythmias.  She underwent previous PVI in 2009 while living in [place]. She presented this admission with atrial flutter and  telemetry has demonstrated intermittent atrial fibrillation. She has been started on Flecainide and Xarelto. Recommend long term anticoagulation with CHADS2VASC of at least 4 With Flecainide, recommend outpatient GXT to evaluate for pro-arrhythmia If she has recurrent atrial arrhythmias despite AAD therapy, could consider increasing Flecainide dose, Tikosyn or possibly repeat ablation  She is pending DCCV this afternoon with duration of AF <48 hours  2.  HTN Stable No change required today  Dr Caryl Comes to see later today  Signed, Chanetta Marshall, NP 11/18/2014 8:39 AM  AGRE WITH the above  She has deteriorated over the last 6 months functionally and she has few palpitations making it unclear as to how much atrial arrhythmia she may be having that is contributing.  It is reasonable to use flecainide  I have discussed with Dr Lavone Nian who say LV wall thickness is about 12 mm;  LA dimension surprisngly small  Agree with long term Rivaroxaban for TE risk reduction  Have suggested she use BP cuff at home to try and track tachycardia ( even relative) as a surrogate for atrial arrhythmia and then to see if it correlates with functional status  These observations would be key to determining the need for onging therapy

## 2014-11-18 ENCOUNTER — Encounter (HOSPITAL_COMMUNITY): Payer: Self-pay | Admitting: Nurse Practitioner

## 2014-11-18 ENCOUNTER — Observation Stay (HOSPITAL_COMMUNITY): Payer: Medicare Other | Admitting: Anesthesiology

## 2014-11-18 ENCOUNTER — Encounter (HOSPITAL_COMMUNITY)
Admission: EM | Disposition: A | Discharge status: Home / Self Care | Payer: Self-pay | Source: Home / Self Care | Attending: Cardiology

## 2014-11-18 DIAGNOSIS — I4891 Unspecified atrial fibrillation: Secondary | ICD-10-CM | POA: Diagnosis present

## 2014-11-18 DIAGNOSIS — Z888 Allergy status to other drugs, medicaments and biological substances status: Secondary | ICD-10-CM | POA: Diagnosis not present

## 2014-11-18 DIAGNOSIS — I959 Hypotension, unspecified: Secondary | ICD-10-CM | POA: Diagnosis not present

## 2014-11-18 DIAGNOSIS — Z853 Personal history of malignant neoplasm of breast: Secondary | ICD-10-CM | POA: Diagnosis not present

## 2014-11-18 DIAGNOSIS — E785 Hyperlipidemia, unspecified: Secondary | ICD-10-CM | POA: Diagnosis not present

## 2014-11-18 DIAGNOSIS — I10 Essential (primary) hypertension: Secondary | ICD-10-CM | POA: Diagnosis not present

## 2014-11-18 DIAGNOSIS — Z885 Allergy status to narcotic agent status: Secondary | ICD-10-CM | POA: Diagnosis not present

## 2014-11-18 DIAGNOSIS — Z79899 Other long term (current) drug therapy: Secondary | ICD-10-CM | POA: Diagnosis not present

## 2014-11-18 DIAGNOSIS — I4892 Unspecified atrial flutter: Secondary | ICD-10-CM | POA: Diagnosis not present

## 2014-11-18 HISTORY — PX: CARDIOVERSION: SHX1299

## 2014-11-18 SURGERY — CARDIOVERSION
Anesthesia: Monitor Anesthesia Care

## 2014-11-18 MED ORDER — LIDOCAINE HCL (CARDIAC) 20 MG/ML IV SOLN
INTRAVENOUS | Status: DC | PRN
  Administered 2014-11-18: 60 mg via INTRAVENOUS

## 2014-11-18 MED ORDER — RIVAROXABAN 20 MG PO TABS: 20.0000 mg | ORAL_TABLET | Freq: Every day | ORAL | Status: AC

## 2014-11-18 MED ORDER — PROPOFOL 10 MG/ML IV BOLUS
INTRAVENOUS | Status: DC | PRN
  Administered 2014-11-18: 100 mg via INTRAVENOUS

## 2014-11-18 MED ORDER — LACTATED RINGERS IV SOLN
INTRAVENOUS | Status: DC | PRN
  Administered 2014-11-18: 13:00:00 via INTRAVENOUS

## 2014-11-18 MED ORDER — FLECAINIDE ACETATE 50 MG PO TABS: 50.0000 mg | ORAL_TABLET | Freq: Two times a day (BID) | ORAL | Status: DC

## 2014-11-18 NOTE — Anesthesia Preprocedure Evaluation (Addendum)
Anesthesia Evaluation  Patient identified by MRN, date of birth, ID band Patient awake    Reviewed: Allergy & Precautions, H&P , NPO status , Patient's Chart, lab work & pertinent test results  History of Anesthesia Complications (+) history of anesthetic complications  Airway Mallampati: I       Dental  (+) Teeth Intact, Dental Advidsory Given   Pulmonary  breath sounds clear to auscultation        Cardiovascular hypertension, Rhythm:Irregular Rate:Normal     Neuro/Psych    GI/Hepatic   Endo/Other    Renal/GU      Musculoskeletal   Abdominal   Peds  Hematology   Anesthesia Other Findings   Reproductive/Obstetrics                            Anesthesia Physical Anesthesia Plan  ASA: III  Anesthesia Plan:    Post-op Pain Management:    Induction:   Airway Management Planned:   Additional Equipment:   Intra-op Plan:   Post-operative Plan:   Informed Consent:   Dental Advisory Given  Plan Discussed with: Anesthesiologist, Surgeon and CRNA  Anesthesia Plan Comments:         Anesthesia Quick Evaluation

## 2014-11-18 NOTE — Transfer of Care (Signed)
Immediate Anesthesia Transfer of Care Note  Patient: Karina Tran  Procedure(s) Performed: Procedure(s): CARDIOVERSION (N/A)  Patient Location: Endoscopy Unit  Anesthesia Type:MAC  Level of Consciousness: awake, alert  and oriented  Airway & Oxygen Therapy: Patient Spontanous Breathing and Patient connected to nasal cannula oxygen  Post-op Assessment: Report given to RN, Post -op Vital signs reviewed and stable and Patient moving all extremities X 4  Post vital signs: Reviewed and stable  Last Vitals:  Filed Vitals:   11/18/14 1214  BP: 141/84  Pulse:   Temp:   Resp: 11    Complications: No apparent anesthesia complications

## 2014-11-18 NOTE — Care Management Note (Signed)
Case Management Note  Patient Details  Name: Karina Tran MRN: [id number] Date of Birth: 1940/07/20  Subjective/Objective:   Pt admitted with a flutter with rvr                 Action/Plan:  Pt is independent from home.  Pt will discharge on Xarelto, CM will assist pt in initiation of medication post discharge.   Expected Discharge Date:                  Expected Discharge Plan:  Home/Self Care  In-House Referral:     Discharge planning Services  CM Consult, Medication Assistance  Post Acute Care Choice:    Choice offered to:     DME Arranged:    DME Agency:     HH Arranged:    [place] Agency:     Status of Service:  Completed, signed off  Medicare Important Message Given:    Date Medicare IM Given:    Medicare IM give by:    Date Additional Medicare IM Given:    Additional Medicare Important Message give by:     If discussed at [place] of Stay Meetings, dates discussed:    Additional Comments: CM provided pt free 30 day card for Xarelto and informed pt that prior authorization would not be required to get this first 30 days filled with card.  Pt pharmacy is able to fill prescription, pharmacy contacted nurse requesting prior authorization post this initial 30 days.  CM submitted benefit check.  Maryclare Labrador, RN 11/18/2014, 4:37 PM

## 2014-11-18 NOTE — Anesthesia Procedure Notes (Signed)
Procedure Name: MAC Date/Time: 11/18/2014 1:10 PM Performed by: Neldon Newport Pre-anesthesia Checklist: Timeout performed, Patient being monitored, Suction available, Emergency Drugs available and Patient identified Patient Re-evaluated:Patient Re-evaluated prior to inductionOxygen Delivery Method: Circle system utilized Preoxygenation: Pre-oxygenation with 100% oxygen Intubation Type: IV induction

## 2014-11-18 NOTE — CV Procedure (Signed)
Direct current cardioversion:  Indication symptomatic A. Flutter  Procedure: Using 100 mg of IV Propofol and 70 IV Lidocaine (for reducing venous pain) for achieving deep sedation, synchronized direct current cardioversion performed. Patient was delivered with 30x1 and 75J  Joules of electricity X 1 with success to NSR. Patient tolerated the procedure well. No immediate complication noted.

## 2014-11-18 NOTE — Discharge Summary (Signed)
Physician Discharge Summary  Patient ID: Karina Tran MRN: [id number] DOB/AGE: October 16, 1940 74 y.o.  Admit date: 11/16/2014 Discharge date: 11/18/2014  Primary Discharge Diagnosis: Atrial flutter, S/P cardioversion to sinus rhythm. CHA2DS2-VASc Score is 3 with yearly risk of stroke of 3.2%. HAS-Bled score is 2 and estimated major bleeding in one year is 1.88-3.2%  2. S/P Radiofrequency ablation for a.fib in [place] (10/06/2010)  Secondary Discharge Diagnosis: hypertension, hyperlipidemia  Hospital Course: Karina Tran is a 74 y.o. female with a history of a.fib s/p ablation in 2012, hypertension, and hyperlipidemia who presented to the ED on 11/16/2014 with symptoms suggestive of recurrent a.fib. She reported extreme fatigue, dyspnea with just minimal exertion, and heart pounding. She then presented to the emergency room for evaluation. Denied any new medications, prescription or OTC.She was found to be in atrial flutter with rapid ventricular response with rates in the 130s.  She was started on cardizem gtt in the ED with improvement in rate but remained in flutter.  She was started on flecainide and Xarelto and underwent successful direct current cardioversion today.  EP - Dr. Virl Axe  was also consulted and continued medical therapy was recommended including Flecainide and NOVAC.   Procedures in hospital Cardioversion 11/18/2014: Using 100 mg of IV Propofol and 70 IV Lidocaine (for reducing venous pain) for achieving deep sedation, synchronized direct current cardioversion performed. Patient was delivered with 30x1 and 75J Joules of electricity X 1 with success to NSR. Patient tolerated the procedure well. No immediate complication noted.  Recommendations on discharge: Continue flecainide and Xarelto (CHA2DS2-VASc Score is 3 with yearly risk of stroke of 3.2%. HAS-Bled score is 2 and estimated major bleeding in one year is 1.88-3.2%).  Follow up outpatient in 1 week for repeat  EKG.    Discharge Exam: Blood pressure 110/74, pulse 67, temperature 98.2 F (36.8 C), temperature source Axillary, resp. rate 19, height 5\' 4"  (1.626 m), weight 68.3 kg (150 lb 9.2 oz), SpO2 99 %.    General appearance: alert, cooperative, appears stated age and no distress Eyes: negative Neck: no adenopathy, no carotid bruit, no JVD, supple, symmetrical, trachea midline and thyroid not enlarged, symmetric, no tenderness/mass/nodules Resp: clear to auscultation bilaterally Chest wall: no tenderness Cardio: regular rate and rhythm, S1, S2 normal, no murmur, click, rub or gallop S2 appears to be widely split with normal respiratory variation. GI: soft, non-tender; bowel sounds normal; no masses, no organomegaly Extremities: extremities normal, atraumatic, no cyanosis or edema Pulses: 2+ and symmetric Skin: Skin color, texture, turgor normal. No rashes or lesions Neurologic: Grossly normal  Labs:   Lab Results  Component Value Date   WBC 9.5 11/16/2014   HGB 12.3 11/16/2014   HCT 38.2 11/16/2014   MCV 78.3 11/16/2014   PLT 256 11/16/2014    Recent Labs Lab 11/16/14 2244  NA 132*  K 3.5  CL 96*  CO2 23  BUN <5*  CREATININE 0.97  CALCIUM 9.5  GLUCOSE 125*    Recent Labs  11/17/14 0012 11/17/14 1318  TROPONINI <0.03 <0.03    Lab Results  Component Value Date   TROPONINI <0.03 11/17/2014     TSH  Recent Labs  11/17/14 1318  TSH 2.275    EKG 11/18/2014 s/p cardioversion: sinus rhythm with 1st degree AV block at a rate of 64bpm, PACs, PRWP   EKG 11/16/2014 at 2231: A.flutter with ventricular rate of 127, PVC, PRWP  Outpatient Echocardiogram: 07/14/2014 1. Left ventricle cavity is normal in size. Mild concentric  hypertrophy of the left ventricle. Normal global wall motion. Calculated EF 58%. 2. Left atrial cavity is mildly dilated. 3. Moderate aortic regurgitation. 4. Mild to moderate mitral regurgitation. Mild calcification of the mitral valve annulus. Mild  mitral valve leaflet thickening. 5. Mild tricuspid regurgitation. No evidence of pulmonary hypertension. 6. Tiny pericardial effusion.  OutpatientTreadmill Exercise Stress 06/29/2014: Indications: Screening for CAD. H/O A. FIbrillation.  Conclusions: Negative for ischemia. Normal BP response. The patient exercised according to the Bruce protocol, Total time recorded 4 Min. 15 sec. achieving a max heart rate of 154 which was 105% of MPHR for age and 7.3 METS of work. Baseline NIBP was . Peak NIBP was 0/0 MaxSysp was: 166 MaxDiasp was: 60. The baseline ECG showed NSR,Normal ECG. During exercise there was No ST-T changes of ischemia. Symptoms: MPHR (100%) achieved, dyspnea. Arrhythmia: None. Continue primary prevention.  Radiology: Dg Chest 2 View  11/16/2014   CLINICAL DATA:  Acute onset of shortness of breath. Initial encounter.  EXAM: CHEST  2 VIEW  COMPARISON:  None.  FINDINGS: The lungs are well-aerated. Mild left basilar opacity may reflect atelectasis or mild pneumonia. Peribronchial thickening is seen. There is no evidence of pleural effusion or pneumothorax.  The heart is mildly enlarged. No acute osseous abnormalities are seen.  IMPRESSION: Mild left basilar opacity may reflect atelectasis or mild pneumonia. Peribronchial thickening seen. Mild cardiomegaly.   Electronically Signed   By: Garald Balding M.D.   On: 11/16/2014 23:31      FOLLOW UP PLANS AND APPOINTMENTS Discharge Instructions    Discharge patient    Complete by:  As directed             Medication List    STOP taking these medications        amLODipine 2.5 MG tablet  Commonly known as:  NORVASC     hydrochlorothiazide 25 MG tablet  Commonly known as:  HYDRODIURIL      TAKE these medications        atenolol 50 MG tablet  Commonly known as:  TENORMIN  Take 50 mg by mouth daily.     CoQ10 200 MG Caps  Take 1 tablet by mouth daily.     flecainide 50 MG tablet  Commonly known as:  TAMBOCOR   Take 1 tablet (50 mg total) by mouth every 12 (twelve) hours.     pantoprazole 40 MG tablet  Commonly known as:  PROTONIX  Take 40 mg by mouth daily.     pravastatin 10 MG tablet  Commonly known as:  PRAVACHOL  Take 10 mg by mouth daily.     rivaroxaban 20 MG Tabs tablet  Commonly known as:  XARELTO  Take 1 tablet (20 mg total) by mouth daily with supper.     UNABLE TO FIND  Take 1 tablet by mouth daily. Calcium 600 mg with Vitamin D3           Follow-up Information    Follow up with Adrian Prows, MD In 1 week.   Specialty:  Cardiology   Why:  Call for appointment   Contact information:   [address] [place] [address] [phone]       Rachel Bo, NP-C 11/18/2014, 4:04 PM [facility] Pager: [phone] Office: [phone]  I have personally reviewed the patient's record and performed physical exam and agree with the assessment and plan of Ms. Karina Labella, NP-C.  Adrian Prows, MD 11/18/2014, 5:24 PM [facility] Pager:  [phone] Office: [phone] If no answer: Cell:  [phone]

## 2014-11-18 NOTE — Interval H&P Note (Signed)
History and Physical Interval Note:  11/18/2014 12:59 PM  Karina Tran  has presented today for surgery, with the diagnosis of afib  The various methods of treatment have been discussed with the patient and family. After consideration of risks, benefits and other options for treatment, the patient has consented to  Procedure(s): CARDIOVERSION (N/A) as a surgical intervention .  The patient's history has been reviewed, patient examined, no change in status, stable for surgery.  I have reviewed the patient's chart and labs.  Questions were answered to the patient's satisfaction.   Patient consents to having students observing: Avanell Shackleton, Ulice Dash

## 2014-11-18 NOTE — Anesthesia Postprocedure Evaluation (Signed)
  Anesthesia Post-op Note  Patient: Karina Tran  Procedure(s) Performed: Procedure(s): CARDIOVERSION (N/A)  Patient Location: Endoscopy Unit  Anesthesia Type:MAC  Level of Consciousness: awake, alert  and oriented  Airway and Oxygen Therapy: Patient Spontanous Breathing and Patient connected to nasal cannula oxygen  Post-op Pain: none  Post-op Assessment: Post-op Vital signs reviewed, Patient's Cardiovascular Status Stable, Respiratory Function Stable, Patent Airway and No signs of Nausea or vomiting              Post-op Vital Signs: Reviewed and stable  Last Vitals:  Filed Vitals:   11/18/14 1214  BP: 141/84  Pulse:   Temp:   Resp: 11    Complications: No apparent anesthesia complications

## 2014-11-18 NOTE — Discharge Instructions (Signed)
Atrial Flutter °Atrial flutter is a heart rhythm that can cause the heart to beat very fast (tachycardia). It originates in the upper chambers of the heart (atria). In atrial flutter, the top chambers of the heart (atria) often beat much faster than the bottom chambers of the heart (ventricles). Atrial flutter has a regular "saw toothed" appearance in an EKG readout. An EKG is a test that records the electrical activity of the heart. Atrial flutter can cause the heart to beat up to 150 beats per minute (BPM). Atrial flutter can either be short lived (paroxysmal) or permanent.  °CAUSES  °Causes of atrial flutter can be many. Some of these include: °· Heart related issues: °· Heart attack (myocardial infarction). °· Heart failure. °· Heart valve problems. °· Poorly controlled high blood pressure (hypertension). °· After open heart surgery. °· Lung related issues: °· A blood clot in the lungs (pulmonary embolism). °· Chronic obstructive pulmonary disease (COPD). Medications used to treat COPD can attribute to atrial flutter. °· Other related causes: °· Hyperthyroidism. °· Caffeine. °· Some decongestant cold medications. °· Low electrolyte levels such as potassium or magnesium. °· Cocaine. °SYMPTOMS °· An awareness of your heart beating rapidly (palpitations). °· Shortness of breath. °· Chest pain. °· Low blood pressure (hypotension). °· Dizziness or fainting. °DIAGNOSIS  °Different tests can be performed to diagnose atrial flutter.  °· An EKG. °· Holter monitor. This is a 24-hour recording of your heart rhythm. You will also be given a diary. Write down all symptoms that you have and what you were doing at the time you experienced symptoms. °· Cardiac event monitor. This small device can be worn for up to 30 days. When you have heart symptoms, you will push a button on the device. This will then record your heart rhythm. °· Echocardiogram. This is an imaging test to look at your heart. Your caregiver will look at your  heart valves and the ventricles. °· Stress test. This test can help determine if the atrial flutter is related to exercise or if coronary artery disease is present. °· Laboratory studies will look at certain blood levels like: °· Complete blood count (CBC). °· Potassium. °· Magnesium. °· Thyroid function. °TREATMENT  °Treatment of atrial flutter varies. A combination of therapies may be used or sometimes atrial flutter may need only 1 type of treatment.  °Lab work: °If your blood work, such as your electrolytes (potassium, magnesium) or your thyroid function tests, are abnormal, your caregiver will treat them accordingly.  °Medication:  °There are several different types of medications that can convert your heart to a normal rhythm and prevent atrial flutter from reoccurring.  °Nonsurgical procedures: °Nonsurgical techniques may be used to control atrial flutter. Some examples include: °· Cardioversion. This technique uses either drugs or an electrical shock to restore a normal heart rhythm: °· Cardioversion drugs may be given through an intravenous (IV) line to help "reset" the heart rhythm. °· In electrical cardioversion, your caregiver shocks your heart with electrical energy. This helps to reset the heartbeat to a normal rhythm. °· Ablation. If atrial flutter is a persistent problem, an ablation may be needed. This procedure is done under mild sedation. High frequency radio-wave energy is used to destroy the area of heart tissue responsible for atrial flutter. °SEEK IMMEDIATE MEDICAL CARE IF:  °You have: °· Dizziness. °· Near fainting or fainting. °· Shortness of breath. °· Chest pain or pressure. °· Sudden nausea or vomiting. °· Profuse sweating. °If you have the above symptoms,   call your local emergency service immediately! Do not drive yourself to the hospital. °MAKE SURE YOU:  °· Understand these instructions. °· Will watch your condition. °· Will get help right away if you are not doing well or get  worse. °Document Released: 08/13/2008 Document Revised: 08/11/2013 Document Reviewed: 08/13/2008 °ExitCare® Patient Information ©2015 [employer]. This information is not intended to replace advice given to you by your health care provider. Make sure you discuss any questions you have with your health care provider. ° °Electrical Cardioversion °Electrical cardioversion is the delivery of a jolt of electricity to change the rhythm of the heart. Sticky patches or metal paddles are placed on the chest to deliver the electricity from a device. This is done to restore a normal rhythm. A rhythm that is too fast or not regular keeps the heart from pumping well. °Electrical cardioversion is done in an emergency if:  °· There is low or no blood pressure as a result of the heart rhythm.   °· Normal rhythm must be restored as fast as possible to protect the brain and heart from further damage.   °· It may save a life. °Cardioversion may be done for heart rhythms that are not immediately life threatening, such as atrial fibrillation or flutter, in which:  °· The heart is beating too fast or is not regular.   °· Medicine to change the rhythm has not worked.   °· It is safe to wait in order to allow time for preparation. °· Symptoms of the abnormal rhythm are bothersome. °· The risk of stroke and other serious problems can be reduced. °LET YOUR HEALTH CARE PROVIDER KNOW ABOUT:  °· Any allergies you have. °· All medicines you are taking, including vitamins, herbs, eye drops, creams, and over-the-counter medicines. °· Previous problems you or members of your family have had with the use of anesthetics.   °· Any blood disorders you have.   °· Previous surgeries you have had.   °· Medical conditions you have. °RISKS AND COMPLICATIONS  °Generally, this is a safe procedure. However, problems can occur and include:  °· Breathing problems related to the anesthetic used. °· A blood clot that breaks free and travels to other parts of  your body. This could cause a stroke or other problems. The risk of this is lowered by use of blood-thinning medicine (anticoagulant) prior to the procedure. °· Cardiac arrest (rare). °BEFORE THE PROCEDURE  °· You may have tests to detect blood clots in your heart and to evaluate heart function.  °· You may start taking anticoagulants so your blood does not clot as easily.   °· Medicines may be given to help stabilize your heart rate and rhythm. °PROCEDURE °· You will be given medicine through an IV tube to reduce discomfort and make you sleepy (sedative).   °· An electrical shock will be delivered. °AFTER THE PROCEDURE °Your heart rhythm will be watched to make sure it does not change.  °Document Released: 03/17/2002 Document Revised: 08/11/2013 Document Reviewed: 10/09/2012 °ExitCare® Patient Information ©2015 [employer]. This information is not intended to replace advice given to you by your health care provider. Make sure you discuss any questions you have with your health care provider. ° °

## 2014-11-19 ENCOUNTER — Encounter (HOSPITAL_COMMUNITY): Payer: Self-pay | Admitting: Cardiology

## 2014-11-19 NOTE — Care Management Note (Signed)
Case Management Note  Patient Details  Name: Karina Tran MRN: [id number] Date of Birth: 04-01-1941  Subjective/Objective:   Pt admitted with a flutter with rvr                 Action/Plan:  Pt is independent from home.  Pt will discharge on Xarelto, CM will assist pt in initiation of medication post discharge.   Expected Discharge Date:                  Expected Discharge Plan:  Home/Self Care  In-House Referral:     Discharge planning Services  CM Consult, Medication Assistance  Post Acute Care Choice:    Choice offered to:     DME Arranged:    DME Agency:     HH Arranged:    [place] Agency:     Status of Service:  Completed, signed off  Medicare Important Message Given:    Date Medicare IM Given:    Medicare IM give by:    Date Additional Medicare IM Given:    Additional Medicare Important Message give by:     If discussed at [place] of Stay Meetings, dates discussed:    Additional Comments: 11/19/2014 Elenor Quinones, RN, BSN [phone] CM received benefit check back from yesterday request: XARELTO 20 MG QD 30 DAY SUPPLY   COVER- YES  CO-PAY- $ 35.00 QUANITLY LIMITED / MAX OF 1 PILL PER DAY  TIER- 3 DRUG  PRIOR APPROVAL- YES # [id number]  [place]  CM contacted pt and informed of copay post initial 30 day period.  CM also requested pt to follow up with prior authorization request today to eliminate chance of missing doses post initial 30 days.  CM contacted PCP and provide prior authorization information.        11/18/14 Elenor Quinones, RN, BSN [phone] CM provided pt free 30 day card for Xarelto and informed pt that prior authorization would not be required to get this first 30 days filled with card.  Pt pharmacy is able to fill prescription, pharmacy contacted nurse requesting prior authorization post this initial 30 days.  CM submitted benefit check.  Maryclare Labrador, RN 11/19/2014, 10:45 AM

## 2015-02-04 ENCOUNTER — Encounter (HOSPITAL_COMMUNITY): Payer: Self-pay | Admitting: *Deleted

## 2015-02-04 ENCOUNTER — Emergency Department (HOSPITAL_COMMUNITY)
Admission: EM | Admit: 2015-02-04 | Discharge: 2015-02-04 | Disposition: A | Discharge status: Home / Self Care | Payer: Medicare Other | Attending: Emergency Medicine | Admitting: Emergency Medicine

## 2015-02-04 ENCOUNTER — Emergency Department (HOSPITAL_COMMUNITY): Payer: Medicare Other

## 2015-02-04 DIAGNOSIS — Y92009 Unspecified place in unspecified non-institutional (private) residence as the place of occurrence of the external cause: Secondary | ICD-10-CM | POA: Diagnosis not present

## 2015-02-04 DIAGNOSIS — S22080A Wedge compression fracture of T11-T12 vertebra, initial encounter for closed fracture: Secondary | ICD-10-CM | POA: Insufficient documentation

## 2015-02-04 DIAGNOSIS — W010XXA Fall on same level from slipping, tripping and stumbling without subsequent striking against object, initial encounter: Secondary | ICD-10-CM | POA: Diagnosis not present

## 2015-02-04 DIAGNOSIS — Z8739 Personal history of other diseases of the musculoskeletal system and connective tissue: Secondary | ICD-10-CM | POA: Insufficient documentation

## 2015-02-04 DIAGNOSIS — I10 Essential (primary) hypertension: Secondary | ICD-10-CM | POA: Insufficient documentation

## 2015-02-04 DIAGNOSIS — Y9389 Activity, other specified: Secondary | ICD-10-CM | POA: Diagnosis not present

## 2015-02-04 DIAGNOSIS — S22000A Wedge compression fracture of unspecified thoracic vertebra, initial encounter for closed fracture: Secondary | ICD-10-CM

## 2015-02-04 DIAGNOSIS — Y998 Other external cause status: Secondary | ICD-10-CM | POA: Insufficient documentation

## 2015-02-04 DIAGNOSIS — Z7901 Long term (current) use of anticoagulants: Secondary | ICD-10-CM | POA: Insufficient documentation

## 2015-02-04 DIAGNOSIS — S199XXA Unspecified injury of neck, initial encounter: Secondary | ICD-10-CM | POA: Diagnosis not present

## 2015-02-04 DIAGNOSIS — Z79899 Other long term (current) drug therapy: Secondary | ICD-10-CM | POA: Diagnosis not present

## 2015-02-04 DIAGNOSIS — Z853 Personal history of malignant neoplasm of breast: Secondary | ICD-10-CM | POA: Insufficient documentation

## 2015-02-04 DIAGNOSIS — S3992XA Unspecified injury of lower back, initial encounter: Secondary | ICD-10-CM | POA: Insufficient documentation

## 2015-02-04 DIAGNOSIS — G8929 Other chronic pain: Secondary | ICD-10-CM | POA: Diagnosis not present

## 2015-02-04 DIAGNOSIS — I4891 Unspecified atrial fibrillation: Secondary | ICD-10-CM | POA: Diagnosis not present

## 2015-02-04 DIAGNOSIS — Z87448 Personal history of other diseases of urinary system: Secondary | ICD-10-CM | POA: Insufficient documentation

## 2015-02-04 DIAGNOSIS — S299XXA Unspecified injury of thorax, initial encounter: Secondary | ICD-10-CM | POA: Diagnosis present

## 2015-02-04 HISTORY — DX: Spinal stenosis, site unspecified: M48.00

## 2015-02-04 LAB — CBC WITH DIFFERENTIAL/PLATELET
BASOS PCT: 0 %
Basophils Absolute: 0 10*3/uL (ref 0.0–0.1)
Eosinophils Absolute: 0 10*3/uL (ref 0.0–0.7)
Eosinophils Relative: 0 %
HEMATOCRIT: 36.9 % (ref 36.0–46.0)
Hemoglobin: 12 g/dL (ref 12.0–15.0)
LYMPHS ABS: 0.5 10*3/uL — AB (ref 0.7–4.0)
Lymphocytes Relative: 6 %
MCH: 27 pg (ref 26.0–34.0)
MCHC: 32.5 g/dL (ref 30.0–36.0)
MCV: 83.1 fL (ref 78.0–100.0)
MONOS PCT: 16 %
Monocytes Absolute: 1.2 10*3/uL — ABNORMAL HIGH (ref 0.1–1.0)
NEUTROS ABS: 6.1 10*3/uL (ref 1.7–7.7)
Neutrophils Relative %: 78 %
Platelets: 134 10*3/uL — ABNORMAL LOW (ref 150–400)
RBC: 4.44 MIL/uL (ref 3.87–5.11)
RDW: 19.3 % — ABNORMAL HIGH (ref 11.5–15.5)
WBC: 7.8 10*3/uL (ref 4.0–10.5)

## 2015-02-04 LAB — URINE MICROSCOPIC-ADD ON

## 2015-02-04 LAB — URINALYSIS, ROUTINE W REFLEX MICROSCOPIC
Glucose, UA: NEGATIVE mg/dL
Hgb urine dipstick: NEGATIVE
NITRITE: NEGATIVE
Protein, ur: 30 mg/dL — AB
Specific Gravity, Urine: 1.017 (ref 1.005–1.030)
Urobilinogen, UA: 1 mg/dL (ref 0.0–1.0)
pH: 6.5 (ref 5.0–8.0)

## 2015-02-04 LAB — COMPREHENSIVE METABOLIC PANEL
ALT: 61 U/L — ABNORMAL HIGH (ref 14–54)
AST: 129 U/L — ABNORMAL HIGH (ref 15–41)
Albumin: 3.2 g/dL — ABNORMAL LOW (ref 3.5–5.0)
Alkaline Phosphatase: 114 U/L (ref 38–126)
Anion gap: 17 — ABNORMAL HIGH (ref 5–15)
BILIRUBIN TOTAL: 1.8 mg/dL — AB (ref 0.3–1.2)
BUN: <5 mg/dL — ABNORMAL LOW (ref 6–20)
CHLORIDE: 92 mmol/L — AB (ref 101–111)
CO2: 26 mmol/L (ref 22–32)
Calcium: 9.1 mg/dL (ref 8.9–10.3)
Creatinine, Ser: 0.78 mg/dL (ref 0.44–1.00)
GFR calc non Af Amer: >60 mL/min (ref >60)
Glucose, Bld: 96 mg/dL (ref 65–99)
POTASSIUM: 3.8 mmol/L (ref 3.5–5.1)
Sodium: 135 mmol/L (ref 135–145)
Total Protein: 6.3 g/dL — ABNORMAL LOW (ref 6.5–8.1)

## 2015-02-04 LAB — LIPASE, BLOOD: LIPASE: 319 U/L — AB (ref 11–51)

## 2015-02-04 MED ORDER — DOCUSATE SODIUM 100 MG PO CAPS: 100.0000 mg | ORAL_CAPSULE | Freq: Two times a day (BID) | ORAL | Status: DC

## 2015-02-04 MED ORDER — HYDROMORPHONE HCL 1 MG/ML IJ SOLN
1.0000 mg | Freq: Once | INTRAMUSCULAR | Status: AC
  Administered 2015-02-04: 1 mg via INTRAVENOUS
  Filled 2015-02-04: qty 1

## 2015-02-04 MED ORDER — SODIUM CHLORIDE 0.9 % IV BOLUS (SEPSIS)
1000.0000 mL | Freq: Once | INTRAVENOUS | Status: AC
  Administered 2015-02-04: 1000 mL via INTRAVENOUS

## 2015-02-04 MED ORDER — IOHEXOL 300 MG/ML  SOLN: 25.0000 mL | Freq: Once | INTRAMUSCULAR | Status: DC | PRN

## 2015-02-04 MED ORDER — IOHEXOL 300 MG/ML  SOLN
100.0000 mL | Freq: Once | INTRAMUSCULAR | Status: AC | PRN
  Administered 2015-02-04: 100 mL via INTRAVENOUS

## 2015-02-04 MED ORDER — HYDROCODONE-ACETAMINOPHEN 5-325 MG PO TABS: 1.0000 | ORAL_TABLET | ORAL | Status: DC | PRN

## 2015-02-04 MED ORDER — HYDROMORPHONE HCL 1 MG/ML IJ SOLN
1.0000 mg | Freq: Once | INTRAMUSCULAR | Status: AC
Start: 2015-02-04 — End: 2015-02-04
  Administered 2015-02-04: 1 mg via INTRAVENOUS
  Filled 2015-02-04: qty 1

## 2015-02-04 NOTE — Discharge Instructions (Signed)
Rest, avoid upright activity, until your symptoms slowly improve over the next 7-10 days. If your symptoms are not improving, follow-up with her primary care physician, or neurosurgeon above. Compression fractures rarely require interventions unless pain is not resolving over the first few weeks. Your CT scan does not show pancreatitis. Your pancreas enzymes are very slightly elevated. If you develop abdominal pain nausea or vomiting then please recheck here, or with your primary care physician.   Spinal Compression Fracture A spinal compression fracture is a collapse of the bones that form the spine (vertebrae). With this type of fracture, the vertebrae become squashed (compressed) into a wedge shape. Most compression fractures happen in the middle or lower part of the spine. CAUSES This condition may be caused by:  Thinning and loss of density in the bones (osteoporosis). This is the most common cause.  A fall.  A car or motorcycle accident.  Cancer.  Trauma, such as a heavy, direct hit to the head. RISK FACTORS You may be at greater risk for a spinal compression fracture if you:  Are 11 years old or older.  Have osteoporosis.  Have certain types of cancer, including:  Multiple myeloma.  Lymphoma.  Prostate cancer.  Lung cancer.  Breast cancer. SYMPTOMS Symptoms of this condition include:  Severe pain.  Pain that gets worse over time.  Pain that is worse when you stand, walk, sit, or bend.  Sudden pain that is so bad that it is hard for you to move.  Bending or humping of the spine.  Gradual loss of height.  Numbness, tingling, or weakness in the back and legs.  Trouble walking. Your symptoms will depend on the cause of the fracture and how quickly it develops. For example, fractures that are caused by osteoporosis can cause few symptoms, no symptoms, or symptoms that develop slowly over time. DIAGNOSIS This condition may be diagnosed based on symptoms,  medical history, and a physical exam. During the physical exam, your health care provider may tap along the length of your spine to check for tenderness. Tests may be done to confirm the diagnosis. They may include:  A bone density test to check for osteoporosis.  Imaging tests, such as a spine X-ray, a CT scan, or MRI. TREATMENT Treatment for this condition depends on the cause and severity of the condition.Some fractures, such as those that are caused by osteoporosis, may heal on their own with supportive care. This may include:  Pain medicine.  Rest.  A back brace.  Physical therapy exercises.  Medicine that reduces bone pain.  Calcium and vitamin D supplements. Fractures that cause the back to become misshapen, cause nerve pain or weakness, or do not respond to other treatment may be treated with a surgical procedure, such as:  Vertebroplasty. In this procedure, bone cement is injected into the collapsed vertebrae to stabilize them.  Balloon kyphoplasty. In this procedure, the collapsed vertebrae are expanded with a balloon and then bone cement is injected into them.  Spinal fusion. In this procedure, the collapsed vertebrae are connected (fused) to normal vertebrae. HOME CARE INSTRUCTIONS General Instructions  Take medicines only as directed by your health care provider.  Do not drive or operate heavy machinery while taking pain medicine.  If directed, apply ice to the injured area:  Put ice in a plastic bag.  Place a towel between your skin and the bag.  Leave the ice on for 30 minutes every two hours at first. Then apply the ice as  needed. °· Wear your neck brace or back brace as directed by your health care provider. °· Do not drink alcohol. Alcohol can interfere with your treatment. °· Keep all follow-up visits as directed by your health care provider. This is important. It can help to prevent permanent injury, disability, and long-lasting (chronic)  pain. °Activity °· Stay in bed (on bed rest) only as directed by your health care provider. Being on bed rest for too long can make your condition worse. °· Return to your normal activities as directed by your health care provider. Ask what activities are safe for you. °· Do exercises to improve motion and strength in your back (physical therapy), as recommended by your health care provider. °· Exercise regularly as directed by your health care provider. °SEEK MEDICAL CARE IF: °· You have a fever. °· You develop a cough that makes your pain worse. °· Your pain medicine is not helping. °· Your pain does not get better over time. °· You cannot return to your normal activities as planned or expected. °SEEK IMMEDIATE MEDICAL CARE IF: °· Your pain is very bad and it suddenly gets worse. °· You are unable to move any body part (paralysis) that is below the level of your injury. °· You have numbness, tingling, or weakness in any body part that is below the level of your injury. °· You cannot control your bladder or bowels. °  °This information is not intended to replace advice given to you by your health care provider. Make sure you discuss any questions you have with your health care provider. °  °Document Released: 03/27/2005 Document Revised: 08/11/2014 Document Reviewed: 03/31/2014 °Elsevier Interactive Patient Education ©2016 [employer] ° °

## 2015-02-04 NOTE — ED Notes (Signed)
Patient reports tripping in bedroom last week and landing on back. Patient reports severe spine pain since falling. Patient reports shaking to her legs. Patient reports she decided to come to the emergency department today because of the continued back and discomfort. Patient reports when she fell she was unable to get back up for an extended period of time.

## 2015-02-04 NOTE — ED Provider Notes (Signed)
Patient discussed with Dr. Colin Rhein. Patient care assumed. Lipase is elevated. CT scan does not show signs of acute pancreatitis. Patient has no complaints to her abdomen. Has been eating normally, no nausea or vomiting. CT scan does confirm acute T11 compression. Patient is given IV pain control symptoms have improved. Discussed voiding upright activity, slowly increase her activity, and primary care follow-up if not improving. Minimal compression thus I doubt she would require kyphoplasty or intervention.  Karina Furry, MD 02/04/15 [phone]

## 2015-02-04 NOTE — ED Notes (Signed)
Attempted IV once and pt. Requested that it be removed. Will have another RN attempt.

## 2015-02-04 NOTE — ED Provider Notes (Signed)
CSN: [id number]     Arrival date & time 02/04/15  1155 History   First MD Initiated Contact with Patient 02/04/15 1306     Chief Complaint  Patient presents with  . Fall  . Back Pain     (Consider location/radiation/quality/duration/timing/severity/associated sxs/prior Treatment) Patient is a 74 y.o. female presenting with back pain.  Back Pain Location:  Generalized Quality:  Shooting Radiates to:  Does not radiate Pain severity:  Severe Pain is:  Same all the time Onset quality:  Sudden Duration:  1 week Timing:  Constant Progression:  Worsening Chronicity:  New Context: falling (mechanical 1 week ago)   Relieved by:  Nothing Worsened by:  Movement, standing, touching and twisting Associated symptoms: tingling   Associated symptoms: no abdominal pain, no bladder incontinence, no bowel incontinence, no dysuria, no fever, no numbness and no paresthesias     Past Medical History  Diagnosis Date  . Persistent atrial fibrillation ([place])     a. s/p PVI 2009 in [place]  . Hypertension   . Breast cancer ([place])        . Complication of anesthesia     " TAKES ALONG TIME TO WAKE ME "  . History of kidney infection   . Spinal stenosis    Past Surgical History  Procedure Laterality Date  . Appendectomy    . Cesarean section    . Carpel tunnel    . Breast surgery      partial mastectomy  . Ablation  2009    PVI in [place]  . Cardioversion N/A 11/18/2014    Procedure: CARDIOVERSION;  Surgeon: Adrian Prows, MD;  Location: [facility];  Service: Cardiovascular;  Laterality: N/A;   History reviewed. No pertinent family history. Social History  Substance Use Topics  . Smoking status: Never Smoker   . Smokeless tobacco: Never Used  . Alcohol Use: Yes     Comment: weekley beer   OB History    No data available     Review of Systems  Constitutional: Negative for fever.  Gastrointestinal: Negative for abdominal pain and bowel incontinence.  Genitourinary: Negative for bladder  incontinence and dysuria.  Musculoskeletal: Positive for back pain.  Neurological: Positive for tingling. Negative for numbness and paresthesias.  All other systems reviewed and are negative.     Allergies  Aspirin and Demerol  Home Medications   Prior to Admission medications   Medication Sig Start Date End Date Taking? Authorizing Provider  atenolol (TENORMIN) 50 MG tablet Take 50 mg by mouth daily.   Yes Historical Provider, MD  Coenzyme Q10 (COQ10) 200 MG CAPS Take 1 tablet by mouth daily.    Yes Historical Provider, MD  flecainide (TAMBOCOR) 50 MG tablet Take 1 tablet (50 mg total) by mouth every 12 (twelve) hours. 11/18/14  Yes Neldon Labella, NP  hydrochlorothiazide (HYDRODIURIL) 25 MG tablet Take 25 mg by mouth daily.   Yes Historical Provider, MD  pantoprazole (PROTONIX) 40 MG tablet Take 40 mg by mouth daily.   Yes Historical Provider, MD  pravastatin (PRAVACHOL) 10 MG tablet Take 10 mg by mouth daily.   Yes Historical Provider, MD  UNABLE TO FIND Take 1 tablet by mouth daily. Calcium 600 mg with Vitamin D3   Yes Historical Provider, MD  docusate sodium (COLACE) 100 MG capsule Take 1 capsule (100 mg total) by mouth every 12 (twelve) hours. 02/04/15   Tanna Furry, MD  HYDROcodone-acetaminophen (NORCO/VICODIN) 5-325 MG tablet Take 1 tablet by mouth every 4 (four) hours  as needed. 02/04/15   Tanna Furry, MD  rivaroxaban (XARELTO) 20 MG TABS tablet Take 1 tablet (20 mg total) by mouth daily with supper. 11/18/14   Bridgette Ebony Hail, NP   BP 141/80 mmHg  Pulse 94  Temp(Src) 98.9 F (37.2 C) (Oral)  Resp 16  SpO2 98% Physical Exam  Constitutional: She is oriented to person, place, and time. She appears well-developed and well-nourished.  HENT:  Head: Normocephalic and atraumatic.  Right Ear: External ear normal.  Left Ear: External ear normal.  Eyes: Conjunctivae and EOM are normal. Pupils are equal, round, and reactive to light.  Neck: Normal range of motion. Neck supple.   Cardiovascular: Normal rate, regular rhythm, normal heart sounds and intact distal pulses.   Pulmonary/Chest: Effort normal and breath sounds normal.  Abdominal: Soft. Bowel sounds are normal. There is no tenderness.  Musculoskeletal: Normal range of motion.       Cervical back: She exhibits tenderness and bony tenderness.       Thoracic back: She exhibits tenderness and bony tenderness.       Lumbar back: She exhibits tenderness and bony tenderness.  Neurological: She is alert and oriented to person, place, and time.  Skin: Skin is warm and dry.  Vitals reviewed.   ED Course  Procedures (including critical care time) Labs Review Labs Reviewed  CBC WITH DIFFERENTIAL/PLATELET - Abnormal; Notable for the following:    RDW 19.3 (*)    Platelets 134 (*)    Lymphs Abs 0.5 (*)    Monocytes Absolute 1.2 (*)    All other components within normal limits  COMPREHENSIVE METABOLIC PANEL - Abnormal; Notable for the following:    Chloride 92 (*)    BUN <5 (*)    Total Protein 6.3 (*)    Albumin 3.2 (*)    AST 129 (*)    ALT 61 (*)    Total Bilirubin 1.8 (*)    Anion gap 17 (*)    All other components within normal limits  LIPASE, BLOOD - Abnormal; Notable for the following:    Lipase 319 (*)    All other components within normal limits  URINALYSIS, ROUTINE W REFLEX MICROSCOPIC (NOT AT [facility]) - Abnormal; Notable for the following:    Color, Urine AMBER (*)    APPearance CLOUDY (*)    Bilirubin Urine MODERATE (*)    Ketones, ur >80 (*)    Protein, ur 30 (*)    Leukocytes, UA SMALL (*)    All other components within normal limits  URINE MICROSCOPIC-ADD ON - Abnormal; Notable for the following:    Bacteria, UA MANY (*)    Casts HYALINE CASTS (*)    All other components within normal limits    Imaging Review Dg Cervical Spine Complete  02/04/2015  CLINICAL DATA:  Slipped and fell on baking soda at home, landed flat on her back, having pain up and down entire spine since wed  night/thurs morning, pt unable to stand due to pain - images obtained lying on table EXAM: CERVICAL SPINE - COMPLETE 4+ VIEW COMPARISON:  None. FINDINGS: There moderate degenerative change in the lower cervical spine, predominantly at C5-6 and C6-7. There is normal alignment of the cervical spine. There is no evidence for acute fracture or dislocation. Prevertebral soft tissues have a normal appearance. Lung apices have a normal appearance. IMPRESSION: 1. Degenerative changes. 2.  No evidence for acute  abnormality. Electronically Signed   By: Nolon Nations M.D.   On: 02/04/2015  15:15   Dg Thoracic Spine 2 View  02/04/2015  CLINICAL DATA:  Acute thoracic spine pain after fall at home. EXAM: THORACIC SPINE 2 VIEWS COMPARISON:  November 16, 2014. FINDINGS: There is interval development of mild wedge compression deformity of T12 vertebral body concerning for acute compression fracture. Mild dextroscoliosis of thoracic spine is noted. No spondylolisthesis is noted. Osteophyte formation is seen along the right side of the mid thoracic spine. IMPRESSION: Mild wedge compression deformity of T12 vertebral body is noted concerning for acute compression fracture. CT may be performed for further evaluation. Electronically Signed   By: Marijo Conception, M.D.   On: 02/04/2015 15:17   Dg Lumbar Spine Complete  02/04/2015  CLINICAL DATA:  Acute lower back pain after fall at home. EXAM: LUMBAR SPINE - COMPLETE 4+ VIEW COMPARISON:  November 16, 2014. FINDINGS: Mild levoscoliosis of lumbar spine is noted. Mild lateral subluxation of L3 on L4 to the left is noted. There appears to be 6 non rib-bearing lumbar type vertebral bodies. The lowest will be named S1 for purposes of this study. There is noted mild anterior wedge compression deformity of T12 vertebral body which may represent acute fracture. Severe degenerative disc disease is noted at L1-2, L2-3, L3-4, and L4-5 with anterior osteophyte formation minimal grade 1  anterolisthesis of L5-S1 is noted secondary to posterior facet joint hypertrophy. Minimal grade 1 retrolisthesis of L3-4 is noted secondary to severe degenerative disc disease. IMPRESSION: Mild anterior wedge compression of T12 vertebral body is noted which may represent acute fracture. CT scan may be performed for further evaluation. Severe multilevel degenerative disc disease is noted in the lumbar spine. Electronically Signed   By: Marijo Conception, M.D.   On: 02/04/2015 15:20   Ct Abdomen Pelvis W Contrast  02/04/2015  CLINICAL DATA:  [place] in her bathroom, back pain radiating to abdomen, elevated lipase, pain in spine and leg since fall, history atrial fibrillation, hypertension, breast cancer EXAM: CT ABDOMEN AND PELVIS WITH CONTRAST TECHNIQUE: Multidetector CT imaging of the abdomen and pelvis was performed using the standard protocol following bolus administration of intravenous contrast. Sagittal and coronal MPR images reconstructed from axial data set. CONTRAST:  140mL OMNIPAQUE IOHEXOL 300 MG/ML SOLN IV. No oral contrast administered. COMPARISON:  None FINDINGS: Bibasilar atelectasis. Marked fatty infiltration of liver. Liver, gallbladder, spleen, pancreas, kidneys, and adrenal glands otherwise normal. Specifically no CT evidence of pancreatitis identified. Stomach incompletely distended, unable to exclude gastric wall thickening with this appearance. Scattered atherosclerotic calcifications. Unremarkable uterus and adnexa. Appendix surgically absent by history. Bladder wall mildly prominent but bladder is decompressed, suspect artifact. Unremarkable ureters. Scattered colonic diverticulosis without evidence of diverticulitis. No mass, adenopathy, free fluid, free air, inflammatory process, or hernia. Bones demineralized with degenerative disc and facet disease changes thoracolumbar spine. Superior endplate compression fracture T11, with mild anterior height loss, likely acute. IMPRESSION: Diffuse fatty  infiltration of liver. Colonic diverticulosis without evidence of diverticulitis. Mild superior endplate compression fracture of T11, likely acute. Electronically Signed   By: Lavonia Dana M.D.   On: 02/04/2015 17:12   I have personally reviewed and evaluated these images and lab results as part of my medical decision-making.   EKG Interpretation   Date/Time:  Thursday February 04 2015 12:04:13 EDT Ventricular Rate:  128 PR Interval:  78 QRS Duration: 76 QT Interval:  328 QTC Calculation: 478 R Axis:   90 Text Interpretation:  Sinus tachycardia with short PR Rightward axis  Nonspecific ST abnormality Abnormal ECG  SINCE LAST TRACING HEART RATE HAS  INCREASED Confirmed by Debby Freiberg [phone]) on 02/04/2015 1:44:17 PM      MDM   Final diagnoses:  Thoracic compression fracture, closed, initial encounter [facility])    74 y.o. female with pertinent PMH of afib, HTN, prior pyelonephritis, chronic back pain from spinal stenosis presents with acute on chronic back pain after mechanical fall last week.  On arrival vitals and physical exam as above, significant for diffuse spinal tenderness.  No abd tenderness on my exam, but pt claims pain radiates into abd.  Wu with elevated lipase.  Plan for CT of abd.  Pt care to Dr. Jeneen Rinks pending dispo.  I have reviewed all laboratory and imaging studies if ordered as above  1. Thoracic compression fracture, closed, initial encounter [facility])         Debby Freiberg, MD 02/05/15 1527

## 2015-03-09 ENCOUNTER — Inpatient Hospital Stay (HOSPITAL_COMMUNITY)
Admission: EM | Admit: 2015-03-09 | Discharge: 2015-03-12 | DRG: 439 | Disposition: A | Discharge status: Home / Self Care | Payer: Medicare Other | Attending: Family Medicine | Admitting: Family Medicine

## 2015-03-09 ENCOUNTER — Emergency Department (HOSPITAL_COMMUNITY): Payer: Medicare Other

## 2015-03-09 ENCOUNTER — Encounter (HOSPITAL_COMMUNITY): Payer: Self-pay | Admitting: Emergency Medicine

## 2015-03-09 DIAGNOSIS — K858 Other acute pancreatitis without necrosis or infection: Secondary | ICD-10-CM

## 2015-03-09 DIAGNOSIS — Z853 Personal history of malignant neoplasm of breast: Secondary | ICD-10-CM

## 2015-03-09 DIAGNOSIS — Z923 Personal history of irradiation: Secondary | ICD-10-CM

## 2015-03-09 DIAGNOSIS — M4854XA Collapsed vertebra, not elsewhere classified, thoracic region, initial encounter for fracture: Secondary | ICD-10-CM | POA: Diagnosis present

## 2015-03-09 DIAGNOSIS — E876 Hypokalemia: Secondary | ICD-10-CM | POA: Diagnosis present

## 2015-03-09 DIAGNOSIS — R634 Abnormal weight loss: Secondary | ICD-10-CM | POA: Insufficient documentation

## 2015-03-09 DIAGNOSIS — K219 Gastro-esophageal reflux disease without esophagitis: Secondary | ICD-10-CM | POA: Diagnosis present

## 2015-03-09 DIAGNOSIS — Z9181 History of falling: Secondary | ICD-10-CM

## 2015-03-09 DIAGNOSIS — I10 Essential (primary) hypertension: Secondary | ICD-10-CM | POA: Diagnosis present

## 2015-03-09 DIAGNOSIS — K649 Unspecified hemorrhoids: Secondary | ICD-10-CM | POA: Diagnosis present

## 2015-03-09 DIAGNOSIS — S22000D Wedge compression fracture of unspecified thoracic vertebra, subsequent encounter for fracture with routine healing: Secondary | ICD-10-CM | POA: Diagnosis not present

## 2015-03-09 DIAGNOSIS — K859 Acute pancreatitis without necrosis or infection, unspecified: Secondary | ICD-10-CM | POA: Diagnosis not present

## 2015-03-09 DIAGNOSIS — Z885 Allergy status to narcotic agent status: Secondary | ICD-10-CM

## 2015-03-09 DIAGNOSIS — J9811 Atelectasis: Secondary | ICD-10-CM | POA: Diagnosis present

## 2015-03-09 DIAGNOSIS — S22000A Wedge compression fracture of unspecified thoracic vertebra, initial encounter for closed fracture: Secondary | ICD-10-CM | POA: Insufficient documentation

## 2015-03-09 DIAGNOSIS — I480 Paroxysmal atrial fibrillation: Secondary | ICD-10-CM | POA: Diagnosis present

## 2015-03-09 DIAGNOSIS — E871 Hypo-osmolality and hyponatremia: Secondary | ICD-10-CM | POA: Diagnosis present

## 2015-03-09 DIAGNOSIS — Z886 Allergy status to analgesic agent status: Secondary | ICD-10-CM

## 2015-03-09 DIAGNOSIS — K760 Fatty (change of) liver, not elsewhere classified: Secondary | ICD-10-CM | POA: Diagnosis present

## 2015-03-09 DIAGNOSIS — K852 Alcohol induced acute pancreatitis without necrosis or infection: Secondary | ICD-10-CM | POA: Diagnosis present

## 2015-03-09 DIAGNOSIS — I4892 Unspecified atrial flutter: Secondary | ICD-10-CM | POA: Diagnosis present

## 2015-03-09 DIAGNOSIS — Z79899 Other long term (current) drug therapy: Secondary | ICD-10-CM

## 2015-03-09 LAB — URINALYSIS, ROUTINE W REFLEX MICROSCOPIC
Glucose, UA: >1000 mg/dL — AB
Hgb urine dipstick: NEGATIVE
Ketones, ur: 40 mg/dL — AB
Leukocytes, UA: NEGATIVE
NITRITE: NEGATIVE
PROTEIN: 30 mg/dL — AB
SPECIFIC GRAVITY, URINE: 1.027 (ref 1.005–1.030)
pH: 6 (ref 5.0–8.0)

## 2015-03-09 LAB — COMPREHENSIVE METABOLIC PANEL
ALK PHOS: 100 U/L (ref 38–126)
ALT: 28 U/L (ref 14–54)
ANION GAP: 13 (ref 5–15)
AST: 48 U/L — ABNORMAL HIGH (ref 15–41)
Albumin: 3.1 g/dL — ABNORMAL LOW (ref 3.5–5.0)
BUN: 7 mg/dL (ref 6–20)
CO2: 25 mmol/L (ref 22–32)
Calcium: 9.4 mg/dL (ref 8.9–10.3)
Chloride: 94 mmol/L — ABNORMAL LOW (ref 101–111)
Creatinine, Ser: 0.93 mg/dL (ref 0.44–1.00)
GFR calc Af Amer: >60 mL/min (ref >60)
GFR, EST NON AFRICAN AMERICAN: 59 mL/min — AB (ref >60)
GLUCOSE: 144 mg/dL — AB (ref 65–99)
POTASSIUM: 3.2 mmol/L — AB (ref 3.5–5.1)
Sodium: 132 mmol/L — ABNORMAL LOW (ref 135–145)
Total Bilirubin: 1.8 mg/dL — ABNORMAL HIGH (ref 0.3–1.2)
Total Protein: 6.9 g/dL (ref 6.5–8.1)

## 2015-03-09 LAB — I-STAT CG4 LACTIC ACID, ED
LACTIC ACID, VENOUS: 3.47 mmol/L — AB (ref 0.5–2.0)
LACTIC ACID, VENOUS: 1.11 mmol/L (ref 0.5–2.0)

## 2015-03-09 LAB — LIPASE, BLOOD: LIPASE: 548 U/L — AB (ref 11–51)

## 2015-03-09 LAB — CBG MONITORING, ED: Glucose-Capillary: 105 mg/dL — ABNORMAL HIGH (ref 65–99)

## 2015-03-09 LAB — URINE MICROSCOPIC-ADD ON

## 2015-03-09 LAB — CBC WITH DIFFERENTIAL/PLATELET
Basophils Absolute: 0 10*3/uL (ref 0.0–0.1)
Basophils Relative: 0 %
Eosinophils Absolute: 0 10*3/uL (ref 0.0–0.7)
Eosinophils Relative: 0 %
HCT: 41.2 % (ref 36.0–46.0)
Hemoglobin: 13.7 g/dL (ref 12.0–15.0)
LYMPHS ABS: 0.5 10*3/uL — AB (ref 0.7–4.0)
LYMPHS PCT: 3 %
MCH: 28.6 pg (ref 26.0–34.0)
MCHC: 33.3 g/dL (ref 30.0–36.0)
MCV: 86 fL (ref 78.0–100.0)
Monocytes Absolute: 1.6 10*3/uL — ABNORMAL HIGH (ref 0.1–1.0)
Monocytes Relative: 10 %
NEUTROS ABS: 13.1 10*3/uL — AB (ref 1.7–7.7)
NEUTROS PCT: 87 %
Platelets: 198 10*3/uL (ref 150–400)
RBC: 4.79 MIL/uL (ref 3.87–5.11)
RDW: 17.5 % — ABNORMAL HIGH (ref 11.5–15.5)
WBC: 15.2 10*3/uL — AB (ref 4.0–10.5)

## 2015-03-09 LAB — PROTIME-INR
INR: 1.12 (ref 0.00–1.49)
Prothrombin Time: 14.6 seconds (ref 11.6–15.2)

## 2015-03-09 MED ORDER — PANTOPRAZOLE SODIUM 40 MG IV SOLR
40.0000 mg | Freq: Every day | INTRAVENOUS | Status: DC
  Administered 2015-03-09: 40 mg via INTRAVENOUS
  Filled 2015-03-09: qty 40

## 2015-03-09 MED ORDER — IOHEXOL 350 MG/ML SOLN
100.0000 mL | Freq: Once | INTRAVENOUS | Status: AC | PRN
  Administered 2015-03-09: 100 mL via INTRAVENOUS

## 2015-03-09 MED ORDER — MORPHINE SULFATE (PF) 4 MG/ML IV SOLN
4.0000 mg | Freq: Once | INTRAVENOUS | Status: AC
  Administered 2015-03-09: 4 mg via INTRAVENOUS
  Filled 2015-03-09: qty 1

## 2015-03-09 MED ORDER — RIVAROXABAN 20 MG PO TABS
20.0000 mg | ORAL_TABLET | Freq: Every day | ORAL | Status: DC
  Administered 2015-03-09 – 2015-03-11 (×3): 20 mg via ORAL
  Filled 2015-03-09 (×4): qty 1

## 2015-03-09 MED ORDER — ATENOLOL 50 MG PO TABS
50.0000 mg | ORAL_TABLET | Freq: Every day | ORAL | Status: DC
  Administered 2015-03-09 – 2015-03-12 (×4): 50 mg via ORAL
  Filled 2015-03-09 (×3): qty 1

## 2015-03-09 MED ORDER — FLECAINIDE ACETATE 50 MG PO TABS
50.0000 mg | ORAL_TABLET | Freq: Two times a day (BID) | ORAL | Status: DC
  Administered 2015-03-09 – 2015-03-12 (×6): 50 mg via ORAL
  Filled 2015-03-09 (×7): qty 1

## 2015-03-09 MED ORDER — SODIUM CHLORIDE 0.9 % IV SOLN
INTRAVENOUS | Status: DC
  Administered 2015-03-09: 19:00:00 via INTRAVENOUS
  Administered 2015-03-10: 150 mL/h via INTRAVENOUS
  Administered 2015-03-10 – 2015-03-11 (×3): via INTRAVENOUS

## 2015-03-09 MED ORDER — POTASSIUM CHLORIDE 10 MEQ/100ML IV SOLN
10.0000 meq | Freq: Once | INTRAVENOUS | Status: AC
  Administered 2015-03-09: 10 meq via INTRAVENOUS
  Filled 2015-03-09: qty 100

## 2015-03-09 MED ORDER — ONDANSETRON HCL 4 MG/2ML IJ SOLN
4.0000 mg | Freq: Once | INTRAMUSCULAR | Status: AC
  Administered 2015-03-09: 4 mg via INTRAVENOUS
  Filled 2015-03-09: qty 2

## 2015-03-09 MED ORDER — ADULT MULTIVITAMIN W/MINERALS CH
1.0000 | ORAL_TABLET | Freq: Every day | ORAL | Status: DC
  Administered 2015-03-09 – 2015-03-12 (×4): 1 via ORAL
  Filled 2015-03-09 (×4): qty 1

## 2015-03-09 MED ORDER — MORPHINE SULFATE (PF) 4 MG/ML IV SOLN
4.0000 mg | INTRAVENOUS | Status: DC | PRN
  Administered 2015-03-10: 4 mg via INTRAVENOUS
  Filled 2015-03-09: qty 1

## 2015-03-09 MED ORDER — SODIUM CHLORIDE 0.9 % IV BOLUS (SEPSIS)
1000.0000 mL | Freq: Once | INTRAVENOUS | Status: AC
  Administered 2015-03-09: 1000 mL via INTRAVENOUS

## 2015-03-09 MED ORDER — THIAMINE HCL 100 MG/ML IJ SOLN
100.0000 mg | Freq: Every day | INTRAMUSCULAR | Status: DC
  Filled 2015-03-09 (×2): qty 2

## 2015-03-09 MED ORDER — SODIUM CHLORIDE 0.9 % IJ SOLN
3.0000 mL | Freq: Two times a day (BID) | INTRAMUSCULAR | Status: DC
  Administered 2015-03-09 – 2015-03-12 (×3): 3 mL via INTRAVENOUS

## 2015-03-09 MED ORDER — BARIUM SULFATE 2.1 % PO SUSP
ORAL | Status: AC
  Filled 2015-03-09: qty 1

## 2015-03-09 MED ORDER — VITAMIN B-1 100 MG PO TABS
100.0000 mg | ORAL_TABLET | Freq: Every day | ORAL | Status: DC
  Administered 2015-03-09 – 2015-03-12 (×4): 100 mg via ORAL
  Filled 2015-03-09 (×5): qty 1

## 2015-03-09 MED ORDER — FOLIC ACID 1 MG PO TABS
1.0000 mg | ORAL_TABLET | Freq: Every day | ORAL | Status: DC
  Administered 2015-03-09 – 2015-03-12 (×4): 1 mg via ORAL
  Filled 2015-03-09 (×4): qty 1

## 2015-03-09 MED ORDER — LORAZEPAM 1 MG PO TABS: 1.0000 mg | ORAL_TABLET | Freq: Four times a day (QID) | ORAL | Status: DC | PRN

## 2015-03-09 MED ORDER — LORAZEPAM 2 MG/ML IJ SOLN: 1.0000 mg | Freq: Four times a day (QID) | INTRAMUSCULAR | Status: DC | PRN

## 2015-03-09 NOTE — ED Notes (Signed)
Bladder scanner revealed 215 mL before the in and out.

## 2015-03-09 NOTE — ED Provider Notes (Signed)
CSN: [id number]     Arrival date & time 03/09/15  0751 History   First MD Initiated Contact with Patient 03/09/15 [phone]     Chief Complaint  Patient presents with  . Abdominal Pain     (Consider location/radiation/quality/duration/timing/severity/associated sxs/prior Treatment) HPI   Pt is a 74 year old female with history of afib presenting with abdominal pain. Is been going on and off for the last 2-3 days. Patient reports mild nausea, vomiting. She is not anything eaten anything in the last 2 days. Patient has history of A. fib patient denies any diarrhea or constipation. She reports she has had bowel movements last 2 days. No fevers.   No dysuria   Past Medical History  Diagnosis Date  . Persistent atrial fibrillation ([place])     a. s/p PVI 2009 in [place]  . Hypertension   . Breast cancer ([place])        . Complication of anesthesia     " TAKES ALONG TIME TO WAKE ME "  . History of kidney infection   . Spinal stenosis    Past Surgical History  Procedure Laterality Date  . Appendectomy    . Cesarean section    . Carpel tunnel    . Breast surgery      partial mastectomy  . Ablation  2009    PVI in [place]  . Cardioversion N/A 11/18/2014    Procedure: CARDIOVERSION;  Surgeon: Adrian Prows, MD;  Location: [place];  Service: Cardiovascular;  Laterality: N/A;   No family history on file. Social History  Substance Use Topics  . Smoking status: Never Smoker   . Smokeless tobacco: Never Used  . Alcohol Use: Yes     Comment: weekley beer   OB History    No data available     Review of Systems  Constitutional: Negative for activity change and fatigue.  HENT: Negative for congestion.   Eyes: Negative for discharge.  Respiratory: Negative for cough and chest tightness.   Cardiovascular: Negative for chest pain.  Gastrointestinal: Positive for nausea, vomiting and abdominal pain. Negative for diarrhea, constipation and abdominal distention.  Genitourinary: Negative for dysuria  and difficulty urinating.  Musculoskeletal: Negative for joint swelling.  Skin: Negative for rash.  Allergic/Immunologic: Negative for immunocompromised state.  Neurological: Negative for seizures and speech difficulty.  Psychiatric/Behavioral: Negative for agitation.      Allergies  Aspirin and Demerol  Home Medications   Prior to Admission medications   Medication Sig Start Date End Date Taking? Authorizing Provider  atenolol (TENORMIN) 50 MG tablet Take 50 mg by mouth daily.   Yes Historical Provider, MD  Coenzyme Q10 (COQ10) 200 MG CAPS Take 1 tablet by mouth daily.    Yes Historical Provider, MD  docusate sodium (COLACE) 100 MG capsule Take 1 capsule (100 mg total) by mouth every 12 (twelve) hours. Patient taking differently: Take 100 mg by mouth 2 (two) times daily as needed for mild constipation.  02/04/15  Yes Tanna Furry, MD  flecainide (TAMBOCOR) 50 MG tablet Take 1 tablet (50 mg total) by mouth every 12 (twelve) hours. 11/18/14  Yes Neldon Labella, NP  hydrochlorothiazide (HYDRODIURIL) 25 MG tablet Take 25 mg by mouth daily.   Yes Historical Provider, MD  HYDROcodone-acetaminophen (NORCO/VICODIN) 5-325 MG tablet Take 1 tablet by mouth every 4 (four) hours as needed. Patient taking differently: Take 1 tablet by mouth every 4 (four) hours as needed for severe pain.  02/04/15  Yes Tanna Furry, MD  pantoprazole (  PROTONIX) 40 MG tablet Take 40 mg by mouth daily.   Yes Historical Provider, MD  rivaroxaban (XARELTO) 20 MG TABS tablet Take 1 tablet (20 mg total) by mouth daily with supper. 11/18/14  Yes Neldon Labella, NP  UNABLE TO FIND Take 1 tablet by mouth daily. Calcium 600 mg with Vitamin D3   Yes Historical Provider, MD   BP 154/71 mmHg  Pulse 87  Resp 17  SpO2 94% Physical Exam  Constitutional: She is oriented to person, place, and time. She appears well-developed and well-nourished.  HENT:  Head: Normocephalic and atraumatic.  Eyes: Conjunctivae are normal. Right  eye exhibits no discharge.  Neck: Neck supple.  Cardiovascular: Normal rate.   No murmur heard. afib   Pulmonary/Chest: Effort normal and breath sounds normal. She has no wheezes. She has no rales.  Abdominal: Soft. She exhibits no distension. There is tenderness. There is guarding.  Diffuse tenderness,   Musculoskeletal: Normal range of motion. She exhibits no edema.  Neurological: She is oriented to person, place, and time. No cranial nerve deficit.  Skin: Skin is warm and dry. No rash noted. She is not diaphoretic.  Psychiatric: She has a normal mood and affect.  Nursing note and vitals reviewed.   ED Course  Procedures (including critical care time) Labs Review Labs Reviewed  CBC WITH DIFFERENTIAL/PLATELET - Abnormal; Notable for the following:    WBC 15.2 (*)    RDW 17.5 (*)    Neutro Abs 13.1 (*)    Lymphs Abs 0.5 (*)    Monocytes Absolute 1.6 (*)    All other components within normal limits  COMPREHENSIVE METABOLIC PANEL - Abnormal; Notable for the following:    Sodium 132 (*)    Potassium 3.2 (*)    Chloride 94 (*)    Glucose, Bld 144 (*)    Albumin 3.1 (*)    AST 48 (*)    Total Bilirubin 1.8 (*)    GFR calc non Af Amer 59 (*)    All other components within normal limits  URINALYSIS, ROUTINE W REFLEX MICROSCOPIC (NOT AT [facility]) - Abnormal; Notable for the following:    Color, Urine ORANGE (*)    Glucose, UA >1000 (*)    Bilirubin Urine MODERATE (*)    Ketones, ur 40 (*)    Protein, ur 30 (*)    All other components within normal limits  URINE MICROSCOPIC-ADD ON - Abnormal; Notable for the following:    Squamous Epithelial / LPF 0-5 (*)    Bacteria, UA RARE (*)    Casts HYALINE CASTS (*)    All other components within normal limits  LIPASE, BLOOD - Abnormal; Notable for the following:    Lipase 548 (*)    All other components within normal limits  I-STAT CG4 LACTIC ACID, ED - Abnormal; Notable for the following:    Lactic Acid, Venous 3.47 (*)    All other  components within normal limits  CBG MONITORING, ED - Abnormal; Notable for the following:    Glucose-Capillary 105 (*)    All other components within normal limits  URINE CULTURE  PROTIME-INR  I-STAT CG4 LACTIC ACID, ED    Imaging Review Ct Cta Abd/pel W/cm &/or W/o Cm  03/09/2015  CLINICAL DATA:  Periumbilical pain. Midline pain for 4 days. History of appendectomy. EXAM: CTA ABDOMEN AND PELVIS WITHOUT AND WITH CONTRAST TECHNIQUE: Multidetector CT imaging of the abdomen and pelvis was performed using the standard protocol during bolus administration of intravenous contrast.  Multiplanar reconstructed images and MIPs were obtained and reviewed to evaluate the vascular anatomy. CONTRAST:  136mL OMNIPAQUE IOHEXOL 350 MG/ML SOLN COMPARISON:  CT 02/04/2015 FINDINGS: Lower chest: Mild interlobular interlobular septal thickening at the lung bases. No focal consolidation. There is atelectasis in the inferior LEFT lung base. Hepatobiliary: Diffuse low-attenuation within liver consistent hepatic steatosis. Normal gallbladder. No biliary duct dilatation. Pancreas: The pancreatic head is mildly edematous compared to CT of 02/04/2015 (image 64, series 501). No significant pancreatic duct dilatation. No pancreatic mass. There is fluid along the LEFT and RIGHT anterior perirenal fascia as well as Morrison's pouch which is increased compared to prior. No organized fluid collection. The pancreatic parenchyma enhances uniformly. Spleen: Normal spleen Adrenals/urinary tract: Adrenal glands and kidneys are normal. There is mild thickening of the anterior bladder wall to 14 mm. Stomach/Bowel: The stomach is normal. There is mild inflammation involving the second portion the duodenum. The fourth portion duodenum and small bowel are normal. Terminal ileum is normal. Appendix not identified. There is mild inflammation involving the splenic flexure of colon which is felt to likely be secondary to pancreatic inflammation. Several  diverticula descending colon. This mild pericolonic inflammation involving splenic flexure seen on image 52, series 501. Vascular/Lymphatic: Arterial phase imaging demonstrates patent celiac trunk and SMA. Mixed density plaque along the inferior surface of the proximal SMA with approximately 30% stenosis (image 62, series 107 close friend. The SMA and mesenteric branches reconstitutes fully. The branches of the celiac trunk are patent. There bilateral single renal arteries which are patent. The inferior mesenteric artery is patent. No evidence of aneurysm of the aortoiliac vessels. There is no retroperitoneal periportal adenopathy. No pelvic adenopathy. Reproductive: Uterus and ovaries are normal. Other: No free fluid. Musculoskeletal: Severe degenerative change of the lumbar spine with anterolisthesis of L4 on L5 in superior facet hypertrophy. No acute findings. Review of the MIP images confirms the above findings. IMPRESSION: 1. No evidence of hemodynamically significant stenosis of the mesenteric vasculature. Nonocclusive plaque within the proximal SMA. 2. Increased edema surrounding the pancreatic head with fluid along the pericolic gutters and inflammation involving splenic flexure of the colon. This is most consistent with acute pancreatitis. No organized fluid collections. Pancreatic enhances uniformly. 3. Hepatic steatosis. 4. Mild interstitial edema and atelectasis at the lung bases. Electronically Signed   By: Suzy Bouchard M.D.   On: 03/09/2015 11:58   I have personally reviewed and evaluated these images and lab results as part of my medical decision-making.   EKG Interpretation None      MDM   Final diagnoses:  Other acute pancreatitis   Pt is a 74 year old patient presenting with abdominal pain. It has been diffuse, for several days with nausea.  Ho 2 c sections. Abdomen is very tender. Concern for SBO vs ischemic bowel. Will treat with pain medication, fluids. Will get lactate to  assess  mesenteric ischemia  9:32 AM Patient has elevated lactate at 3.47. This raises my concern for mesenteric ischemia. Will order CT CTA to rule  better assess for arterial flow throughout the mesentery.  CT showed pancreatitis, lipase elevated. Pt admits to heavy beer drinking.  Will admit for alcohol pancreatitis. Lactate resolved with fluids  Trenity Pha Julio Alm, MD 03/09/15 1528

## 2015-03-09 NOTE — Progress Notes (Signed)
Pt admitted to the unit at 1713. Pt mental status is A&O x4. Pt oriented to room, staff, and call bell. Skin is Intact. Full assessment charted in [facility]. Call bell within reach. Visitor guidelines reviewed w/ pt and/or family.

## 2015-03-09 NOTE — ED Notes (Signed)
Patient transported to CT 

## 2015-03-09 NOTE — Progress Notes (Signed)
Report received from [place], RN in ED. 

## 2015-03-09 NOTE — ED Notes (Signed)
MD at bedside. 

## 2015-03-09 NOTE — H&P (Signed)
[facility] Admission History and Physical Service Pager: [phone]  Patient name: Karina Tran Medical record number: [id number] Date of birth: [birth year] Age: 74 y.o. Gender: female  Primary Care Provider: Curly Rim, MD Consultants: none Code Status: FULL  Chief Complaint: abdominal pain, N/V  Assessment and Plan: Kamylah Yaccarino is a 74 y.o. female presenting with abdominal pain, N/V, found to have elevated lipase and acute pancreatitis on CT PMH significant for hx breast cancer in L breast s/p lumpectomy 03/2005 with adjuvant radiation therapy and 61yr Arimidex completed 2012 (now obs), hx of Aflutter (s/p cardioversion 11/2014 to NSR; on Xarelto).   Acute Pancreatitis, uncomplicated, suspected from alcohol use: Symptoms of N/V with diffuse abdominal pain with elevated lipase. CT Abdomen and Pelvis consistent with acute pancreatitis with increased edema around pancreatic head and inflammation involvnig splenic flexure of colon. No signs of complications such as organized fluid collections. No signs of cholelithiasis or cholecystitis on CT. Signs and symptoms not consistent with mesenteric ischemia. Possibly etiology includes alcohol use although patient denies drinking for the past 6 days. No gall stones noted on CT as a possible cause. No new medications that may increase risk of pancreatitis. Consider hyperTG, no prior lipids on chart. Patient's vitals are hemodynamically stable and is afebrile. Lactic acid elevated in ED but normalized with IVF. Leukocytosis on admission at 15.2, but no signs or symptoms of infection; possibly due to hemoconcentration.  - admit inpatient telemetry, attending Dr. Ree Kida  - NPO, sips and chips with meds for bowel rest, anticipate adv diet early to clears 1-2 days if improved - IV Morphine 4mg  q 3hr PRN for pain control  - s/p 2L in ED; IVF 133ml/hr  - s/p Zofran in ED; holding antiemetics for now due to prolonged QTc  on EKG in ED (will repeat prior to starting) - Monitor BMET, CBC - Check Lipid panel in AM  Alcohol Use: Drinks 3-4 beers daily. Per patient last drink was about 6 days ago. No past history of withdrawal symptoms/seizures per patient.  Low risk for withdrawal.  - CIWA protocol x 24 hrs  - thiamine, Multivitamin  Hypokalemia: on admission K 3.2 - s/p IV K 50mEq x 1 in ED - will monitor with AM labs  Hx AFlutter: h/o ablation 2012, with refractory AFib s/p cardioversion 11/2014 to NSR; on Xarelto and Flecanide. Of note she had been off of both of these meds for past 3-4 days due to nausea. - cont Flecanide 50 BID - cont Xeralto 20mg  daily  HTN: stable on admission  - continue home Atenolol  - holding HCTZ   Prolonged QTc: EKG on admit shows QTc of 550, NSR, with PAC.  - repeat EKG this evening  Hx L breast cancer: s/p lumpectomy 03/2005 with adjuvant radiation therapy and 39yr Arimidex completed 2012 (now obs)  GERD: - Home PPI - protonix IV until tolerating PO  T11 Compression Fracture, subacute (02/04/15) Recent history of fall >2 months ago, subsequently evaluated in ED 10/27 on CT imaging confirms acute T11 compression fracture. Treated with pain control. Suspect osteoporosis. May be worsened with poor nutrition with alcohol. - Check Vitamin D level in AM  FEN/GI: NPO; IVF, IV Protonix Prophylaxis: on Xarelto   Disposition: admit to inpatient, telemetry for management of acute pancreatitis   History of Present Illness:  [name] is a 74 y.o. female presenting with abdominal pain, nausea, and vomiting x 4 days.   Reports symptoms with generalized abdominal pain  and nausea started about 4 days ago on Friday evening, symptoms worsened over the weekend with frequent gagging and some vomiting (non-bloody, non bilious) small amount, she states that the gagging and nausea start BEFORE the abdominal pain and she ate a "spicy meal prior". Describes abdominal pain as located  epigastric down to "pelvic bone" and across entire abdomen, without radiation, severity 10/10 constant, pain is worse with any gagging, coughing, deep breathing. No alleviating factors.  Admits to her son-in-law having some episode of abdominal pain that "felt like gas" about 2-3 weeks ago.  Does have a history of alcohol use. Drinks 3-4 cans of beer daily for past >50 years. Denies ever having history of alcohol withdrawals, tremors, seizures, DTs. Never quit or cut back before. Never formally had alcohol detox. Last drink 6 days ago. Denies any significant increase in alcohol consumption or binge.  This is her first episode of pancreatitis. Denies history of any PUD or esophageal varices. Followed by GI Jule Ser, [facility]) for hemorrhoids.  Notes of no PO intake since symptoms started due to N/V. Also notes of constipation (last BM 4-5 days ago) and decreased urine output. Denies any fevers/chills, chest pain, shortness of breath, HA, vision changes, dysuria, urinary frequency  On 01/27/15 had a fall at home, followed up in the ED about 6 days later and was found to have a T11 compression fracture. She states she did not want to follow-up initially. Has been on Norco once daily.  Did have a history of Atrial Flutter, she was cardioverted. Previously taking Flecanide and Xarelto, last dose of both was 5 days ago due to nausea.  History of breast cancer, reports she is cancer free for past 8 years.  Lives at home with daughter, who is a Marine scientist.  In the ED, patient's vitals were stable. Found to have lactic acid 3.47 which improved to 1.11 with IVF (2L ). Lipase was elevated to 548. Pain was controlled with morphine. Also noted to have leukocytosis 15.2. Additionally, K was 3.2 and Na mildly low at 132. Also, tbili was elevated at 1.8, otherwise LFTs were fairly normal.  UA showed moderate bili, hyaline casts, >1000 glucose, 40 ketones, negative for infectious process. CT abdomen and pelvis showed  signs of acute pancreatitis.    Review Of Systems: Per HPI  Otherwise the remainder of the systems were negative.  Patient Active Problem List   Diagnosis Date Noted  . Pancreatitis 03/09/2015  . Atrial flutter with rapid ventricular response ([place]) 11/17/2014  . Atrial flutter ([place]) 11/17/2014  . Breast cancer, left breast ([place]) 06/25/2014    Past Medical History: Past Medical History  Diagnosis Date  . Persistent atrial fibrillation ([place])     a. s/p PVI 2009 in [place]  . Hypertension   . Breast cancer ([place])        . Complication of anesthesia     " TAKES ALONG TIME TO WAKE ME "  . History of kidney infection   . Spinal stenosis     Past Surgical History: Past Surgical History  Procedure Laterality Date  . Appendectomy    . Cesarean section    . Carpel tunnel    . Breast surgery      partial mastectomy  . Ablation  2009    PVI in [place]  . Cardioversion N/A 11/18/2014    Procedure: CARDIOVERSION;  Surgeon: Adrian Prows, MD;  Location: [place];  Service: Cardiovascular;  Laterality: N/A;    Social History: Social History  Substance  Use Topics  . Smoking status: Never Smoker   . Smokeless tobacco: Never Used  . Alcohol Use: Yes     Comment: weekley beer   Please also refer to relevant sections of EMR.  Family History: No family history on file.   Allergies and Medications: Allergies  Allergen Reactions  . Aspirin     Bloody noses  . Demerol [Meperidine]     vomiting   No current facility-administered medications on file prior to encounter.   Current Outpatient Prescriptions on File Prior to Encounter  Medication Sig Dispense Refill  . atenolol (TENORMIN) 50 MG tablet Take 50 mg by mouth daily.    . Coenzyme Q10 (COQ10) 200 MG CAPS Take 1 tablet by mouth daily.     Marland Kitchen docusate sodium (COLACE) 100 MG capsule Take 1 capsule (100 mg total) by mouth every 12 (twelve) hours. (Patient taking differently: Take 100 mg by mouth 2 (two) times daily as needed for mild  constipation. ) 60 capsule 0  . flecainide (TAMBOCOR) 50 MG tablet Take 1 tablet (50 mg total) by mouth every 12 (twelve) hours. 60 tablet 0  . hydrochlorothiazide (HYDRODIURIL) 25 MG tablet Take 25 mg by mouth daily.    Marland Kitchen HYDROcodone-acetaminophen (NORCO/VICODIN) 5-325 MG tablet Take 1 tablet by mouth every 4 (four) hours as needed. (Patient taking differently: Take 1 tablet by mouth every 4 (four) hours as needed for severe pain. ) 30 tablet 0  . pantoprazole (PROTONIX) 40 MG tablet Take 40 mg by mouth daily.    . rivaroxaban (XARELTO) 20 MG TABS tablet Take 1 tablet (20 mg total) by mouth daily with supper. 30 tablet 0  . UNABLE TO FIND Take 1 tablet by mouth daily. Calcium 600 mg with Vitamin D3      Objective: BP 154/71 mmHg  Pulse 87  Resp 17  SpO2 94% Exam: General: In some discomfort, alert and oriented Eyes: EMOI, PERRL  ENTM: MMM, normal oropharynx Neck: normal ROM Cardiovascular: RRR, no m/r/g Respiratory: CTAB, normal effort Abdomen: + bowel sounds, generalized tenderness to palpation poor localization. MSK: no LE edema or calf tenderness; able to move extremities without difficulty  Skin: warm and dry, no rashes  Neuro: Awake, alert, no focal deficits grossly, normal speech Psych: Mood and affect euthymic, normal rate and volume of speech  Labs and Imaging: CBC BMET   Recent Labs Lab 03/09/15 0845  WBC 15.2*  HGB 13.7  HCT 41.2  PLT 198    Recent Labs Lab 03/09/15 0845  NA 132*  K 3.2*  CL 94*  CO2 25  BUN 7  CREATININE 0.93  GLUCOSE 144*  CALCIUM 9.4     CT ABD/PELVIS: FINDINGS: Lower chest: Mild interlobular interlobular septal thickening at the lung bases. No focal consolidation. There is atelectasis in the inferior LEFT lung base.  Hepatobiliary: Diffuse low-attenuation within liver consistent hepatic steatosis. Normal gallbladder. No biliary duct dilatation.  Pancreas: The pancreatic head is mildly edematous compared to CT of 02/04/2015  (image 64, series 501). No significant pancreatic duct dilatation. No pancreatic mass.  There is fluid along the LEFT and RIGHT anterior perirenal fascia as well as Morrison's pouch which is increased compared to prior. No organized fluid collection. The pancreatic parenchyma enhances uniformly.  Spleen: Normal spleen  Adrenals/urinary tract: Adrenal glands and kidneys are normal. There is mild thickening of the anterior bladder wall to 14 mm.  Stomach/Bowel: The stomach is normal. There is mild inflammation involving the second portion the duodenum. The  fourth portion duodenum and small bowel are normal. Terminal ileum is normal. Appendix not identified. There is mild inflammation involving the splenic flexure of colon which is felt to likely be secondary to pancreatic inflammation. Several diverticula descending colon.  This mild pericolonic inflammation involving splenic flexure seen on image 52, series 501.  Vascular/Lymphatic: Arterial phase imaging demonstrates patent celiac trunk and SMA. Mixed density plaque along the inferior surface of the proximal SMA with approximately 30% stenosis (image 62, series 107 close friend. The SMA and mesenteric branches reconstitutes fully. The branches of the celiac trunk are patent.  There bilateral single renal arteries which are patent. The inferior mesenteric artery is patent. No evidence of aneurysm of the aortoiliac vessels.  There is no retroperitoneal periportal adenopathy. No pelvic adenopathy.  Reproductive: Uterus and ovaries are normal.  Other: No free fluid.  Musculoskeletal: Severe degenerative change of the lumbar spine with anterolisthesis of L4 on L5 in superior facet hypertrophy. No acute findings.  Review of the MIP images confirms the above findings.  IMPRESSION: 1. No evidence of hemodynamically significant stenosis of the mesenteric vasculature. Nonocclusive plaque within the proximal SMA. 2.  Increased edema surrounding the pancreatic head with fluid along the pericolic gutters and inflammation involving splenic flexure of the colon. This is most consistent with acute pancreatitis. No organized fluid collections. Pancreatic enhances uniformly. 3. Hepatic steatosis. 4. Mild interstitial edema and atelectasis at the lung bases.   Smiley Houseman, MD 03/09/2015, 3:38 PM PGY-1, [facility] Intern pager: [phone], text pages welcome  Upper Level Addendum:  I have seen and evaluated this patient along with Dr. Dallas Schimke and reviewed the above note, making necessary revisions in purple.  Nobie Putnam, [place], PGY-3

## 2015-03-09 NOTE — ED Notes (Signed)
Pt arrives via [facility] for c/o mid abdominal pain/n/v since yesterday morning. Pt received 4mg  zofran via ems prior to arrival.

## 2015-03-10 LAB — LIPID PANEL
CHOL/HDL RATIO: 2.1 ratio
CHOLESTEROL: 144 mg/dL (ref 0–200)
HDL: 68 mg/dL (ref >40)
LDL CALC: 69 mg/dL (ref 0–99)
TRIGLYCERIDES: 37 mg/dL (ref <150)
VLDL: 7 mg/dL (ref 0–40)

## 2015-03-10 LAB — BASIC METABOLIC PANEL
Anion gap: 8 (ref 5–15)
Anion gap: 7 (ref 5–15)
BUN: 5 mg/dL — AB (ref 6–20)
BUN: 5 mg/dL — ABNORMAL LOW (ref 6–20)
CALCIUM: 8 mg/dL — AB (ref 8.9–10.3)
CHLORIDE: 102 mmol/L (ref 101–111)
CHLORIDE: 100 mmol/L — AB (ref 101–111)
CO2: 26 mmol/L (ref 22–32)
CO2: 24 mmol/L (ref 22–32)
CREATININE: 0.77 mg/dL (ref 0.44–1.00)
CREATININE: 0.67 mg/dL (ref 0.44–1.00)
Calcium: 8 mg/dL — ABNORMAL LOW (ref 8.9–10.3)
GFR calc Af Amer: >60 mL/min (ref >60)
GFR calc Af Amer: >60 mL/min (ref >60)
GFR calc non Af Amer: >60 mL/min (ref >60)
GLUCOSE: 74 mg/dL (ref 65–99)
GLUCOSE: 137 mg/dL — AB (ref 65–99)
Potassium: 2.5 mmol/L — CL (ref 3.5–5.1)
Potassium: 3.1 mmol/L — ABNORMAL LOW (ref 3.5–5.1)
Sodium: 136 mmol/L (ref 135–145)
Sodium: 131 mmol/L — ABNORMAL LOW (ref 135–145)

## 2015-03-10 LAB — CBC
HEMATOCRIT: 32.4 % — AB (ref 36.0–46.0)
HEMOGLOBIN: 10.5 g/dL — AB (ref 12.0–15.0)
MCH: 28.7 pg (ref 26.0–34.0)
MCHC: 32.4 g/dL (ref 30.0–36.0)
MCV: 88.5 fL (ref 78.0–100.0)
Platelets: 145 10*3/uL — ABNORMAL LOW (ref 150–400)
RBC: 3.66 MIL/uL — ABNORMAL LOW (ref 3.87–5.11)
RDW: 18.2 % — ABNORMAL HIGH (ref 11.5–15.5)
WBC: 15.2 10*3/uL — ABNORMAL HIGH (ref 4.0–10.5)

## 2015-03-10 LAB — URINE CULTURE: Culture: NO GROWTH

## 2015-03-10 LAB — MAGNESIUM: Magnesium: 1.6 mg/dL — ABNORMAL LOW (ref 1.7–2.4)

## 2015-03-10 MED ORDER — BOOST / RESOURCE BREEZE PO LIQD
1.0000 | Freq: Two times a day (BID) | ORAL | Status: DC
  Administered 2015-03-10 – 2015-03-12 (×4): 1 via ORAL

## 2015-03-10 MED ORDER — POTASSIUM CHLORIDE 10 MEQ/100ML IV SOLN
10.0000 meq | INTRAVENOUS | Status: AC
  Administered 2015-03-10 (×3): 10 meq via INTRAVENOUS
  Filled 2015-03-10 (×2): qty 100

## 2015-03-10 MED ORDER — OXYCODONE HCL 5 MG PO TABS
5.0000 mg | ORAL_TABLET | ORAL | Status: DC | PRN
  Administered 2015-03-10 – 2015-03-12 (×3): 5 mg via ORAL
  Filled 2015-03-10 (×5): qty 1

## 2015-03-10 MED ORDER — PANTOPRAZOLE SODIUM 40 MG PO TBEC
40.0000 mg | DELAYED_RELEASE_TABLET | Freq: Every day | ORAL | Status: DC
  Administered 2015-03-10 – 2015-03-11 (×2): 40 mg via ORAL
  Filled 2015-03-10 (×2): qty 1

## 2015-03-10 MED ORDER — MAGNESIUM SULFATE IN D5W 10-5 MG/ML-% IV SOLN
1.0000 g | Freq: Once | INTRAVENOUS | Status: AC
  Administered 2015-03-10: 1 g via INTRAVENOUS
  Filled 2015-03-10: qty 100

## 2015-03-10 MED ORDER — POTASSIUM CHLORIDE CRYS ER 20 MEQ PO TBCR
40.0000 meq | EXTENDED_RELEASE_TABLET | Freq: Two times a day (BID) | ORAL | Status: DC
  Administered 2015-03-10 – 2015-03-12 (×4): 40 meq via ORAL
  Filled 2015-03-10 (×5): qty 2

## 2015-03-10 MED ORDER — POTASSIUM CHLORIDE CRYS ER 20 MEQ PO TBCR
40.0000 meq | EXTENDED_RELEASE_TABLET | Freq: Once | ORAL | Status: AC
  Administered 2015-03-10: 40 meq via ORAL
  Filled 2015-03-10: qty 2

## 2015-03-10 NOTE — Progress Notes (Signed)
Pt potassium 2.5 on call MD NOTIFIED will continue to monitor

## 2015-03-10 NOTE — Discharge Summary (Signed)
[facility] Discharge Summary  Patient name: Karina Tran Medical record number: [id number] Date of birth: Jun 27, 1940 Age: 74 y.o. Gender: female Date of Admission: 03/09/2015  Date of Discharge: 03/12/2015 Admitting Physician: Karina Dawn, MD  Primary Care Provider: Curly Rim, MD Consultants: None  Indication for Hospitalization: Acute pancreatitis  Discharge Diagnoses/Problem List:  Patient Active Problem List   Diagnosis Date Noted  . Pancreatitis 03/09/2015  . Acute pancreatitis 03/09/2015  . Essential hypertension   . Loss of weight   . Thoracic compression fracture (Karina Tran)   . Paroxysmal atrial fibrillation ([facility])   . Atrial flutter with rapid ventricular response (Karina Tran) 11/17/2014  . Atrial flutter (Karina Tran) 11/17/2014  . Breast cancer, left breast (Karina Tran) 06/25/2014    Disposition: Home  Discharge Condition: Improved   Discharge Exam:  General: Elderly woman, laying in bed, resting comfortably, in NAD Cardiovascular: RRR, no m/r/g Respiratory: CTAB, normal effort Abdomen: + bowel sounds, mild TTP in epigastric area, non-distended, soft, no rebound or guarding. MSK: no LE edema, able to move extremities without difficulty  Skin: warm and dry, no rashes, no erythema or edema at PIV  Neuro: Awake, alert, no focal deficits grossly, normal speech  Brief Hospital Course:  Karina Tran is a 74 year old female who presented to the ED with generalized abdominal pain, nausea, and vomiting for 4 days. In the ED, she was found to have acute pancreatitis with a lipase of 548. CT abdomen/pelvis showed increased edema around the pancreatic head and inflammation involving the splenic flexure of the colon. We thought her pancreatitis was likely caused by alcohol use, as Karina Tran endorses consuming 3-4 beers daily. Other causes were ruled out, including gallstones (not seen on CT), hypertriglyceridemia (normal lipid panel), and new medications (Karina Tran denied  any new meds). We made her NPO and treated her with morphine for pain. We did not give her any anti-emetics because her EKG showed prolonged QTc. Patient tolerated transition to PO pain meds well. Her abdominal pain improved, nausea/vomiting resolved and we advanced her diet, which she tolerated well. Discharged with instructions to advance diet slowly and to eat bland foods for at least a few days.   Issues for Follow Up:  1. Karina Tran had some hypokalemia during her hospitalization. We recommend repeat BMET as an outpatient. Discharged with Kdur 40 meq a day until follow up.  2. Was additionally found to be hyponatremic during hospitalization. Could be attributed to hx of beer consumption. Would recommend f/u outpatient.    Significant Procedures: None   Significant Labs and Imaging:   Recent Labs Lab 03/10/15 0446 03/11/15 0805 03/12/15 0641  WBC 15.2* 11.3* 8.9  HGB 10.5* 10.2* 9.9*  HCT 32.4* 32.0* 31.0*  PLT 145* 151 187    Recent Labs Lab 03/09/15 0845 03/10/15 0446 03/10/15 1542 03/11/15 0805 03/12/15 0641  NA 132* 136 131* 131* 129*  K 3.2* 2.5* 3.1* 4.7 4.4  CL 94* 102 100* 104 101  CO2 25 26 24  20* 22  GLUCOSE 144* 74 137* 104* 107*  BUN 7 5* 5* 5* <5*  CREATININE 0.93 0.77 0.67 0.59 0.61  CALCIUM 9.4 8.0* 8.0* 7.9* 8.4*  MG  --  1.6*  --   --   --   ALKPHOS 100  --   --   --   --   AST 48*  --   --   --   --   ALT 28  --   --   --   --  ALBUMIN 3.1*  --   --   --   --     Results/Tests Pending at Time of Discharge: None  Discharge Medications:    Medication List    TAKE these medications        atenolol 50 MG tablet  Commonly known as:  TENORMIN  Take 50 mg by mouth daily.     CoQ10 200 MG Caps  Take 1 tablet by mouth daily.     docusate sodium 100 MG capsule  Commonly known as:  COLACE  Take 1 capsule (100 mg total) by mouth every 12 (twelve) hours.     flecainide 50 MG tablet  Commonly known as:  TAMBOCOR  Take 1 tablet (50 mg total) by mouth  every 12 (twelve) hours.     hydrochlorothiazide 25 MG tablet  Commonly known as:  HYDRODIURIL  Take 25 mg by mouth daily.     HYDROcodone-acetaminophen 5-325 MG tablet  Commonly known as:  NORCO/VICODIN  Take 1 tablet by mouth every 4 (four) hours as needed.     pantoprazole 40 MG tablet  Commonly known as:  PROTONIX  Take 40 mg by mouth daily.     potassium chloride SA 20 MEQ tablet  Commonly known as:  K-DUR,KLOR-CON  Take 2 tablets (40 mEq total) by mouth daily.     rivaroxaban 20 MG Tabs tablet  Commonly known as:  XARELTO  Take 1 tablet (20 mg total) by mouth daily with supper.     UNABLE TO FIND  Take 1 tablet by mouth daily. Calcium 600 mg with Vitamin D3        Discharge Instructions: Please refer to Patient Instructions section of EMR for full details.  Patient was counseled important signs and symptoms that should prompt return to medical care, changes in medications, dietary instructions, activity restrictions, and follow up appointments.   Follow-Up Appointments: Follow-up Information    Follow up with Tran,Karina A, MD. Schedule an appointment as soon as possible for a visit in 1 week.   Specialty:  Family Medicine   Why:  For Hospital Followup   Contact information:   [place] [address] [place] [address] [phone]       Karina Tran, D.O. 03/12/2015, 10:57 AM PGY-1, [place]

## 2015-03-10 NOTE — Progress Notes (Signed)
CRITICAL VALUE ALERT  Critical value received: potassium  Date of notification:  03/10/2015  Time of notification: 0630  Critical value read back: yes  Nurse who received alert: Bing Plume  MD notified (1st page): teaching service  Time of first page: 0630  MD notified (2nd page):yes  Time of second [id number]  Responding MD:  Teaching service  Time MD responded: [phone]

## 2015-03-10 NOTE — Progress Notes (Signed)
Family Medicine Teaching Service Daily Progress Note Intern Pager: [phone]  Patient name: Karina Tran Medical record number: [id number] Date of birth: April 09, 1941 Age: 74 y.o. Gender: female  Primary Care Provider: Curly Rim, MD Consultants: None Code Status: Full  Pt Overview and Major Events to Date:  11/29: Admitted to FMTS for acute alcoholic pancreatitis  Assessment and Plan: Karina Tran is a 74 y.o. female presenting with abdominal pain, N/V, found to have elevated lipase and acute pancreatitis on CT. PMH significant for hx breast cancer in L breast s/p lumpectomy 03/2005 with adjuvant radiation therapy and 81yr Arimidex completed 2012 (now obs), hx of Aflutter (s/p cardioversion 11/2014 to NSR; on Xarelto).   Acute Pancreatitis, uncomplicated, possibly from alcohol use: Lipase elevated to 548. CT abd/pelv consistent with acute pancreatitis with increased edema around pancreatic head and inflammation involving splenic flexure of colon. No signs of complications such as organized fluid collections. No signs of cholelithiasis or cholecystitis on CT and no new meds  Patient's vitals are hemodynamically stable and she is afebrile. Lactic acid elevated to 3.47 in the ED > 1.11. Leukocytosis to 15.2, but no signs or symptoms of infection; possibly due to hemoconcentration.  - Pt has been NPO, will advance to clear liquid diet today. - IV Morphine 4mg  q 3hr PRN for pain control. Pt required 1 dose overnight. -  Continue IVFs at 117ml/hr. Will d/c later today if she is having good PO intake. - Holding antiemetics for now due to prolonged QTc to 550 in ED. Repeat EKG is pending.  - Lipid panel: Chol 144, TG 37, HDL 68, LDL 69 - Monitor BMET, CBC  Alcohol Use: Drinks 3-4 beers daily. Per patient last drink was about 6 days ago. No past history of withdrawal symptoms/seizures per patient. Low risk for withdrawal.  - Pt has been on CIWA overnight- scores have been 0. Will d/c  CIWA this morning. - Continue Thiamine, Multivitamin  Hypokalemia: On admission K 3.2, K was 2.5 this morning. Magnesium is slightly low at 1.6. - Receiving 3 runs of K starting early this morning. Now that she is on clears, will give her an additional K-Dur 61mEq this afternoon.  - Will replace magnesium x 1 this morning. - Will monitor with daily BMETs  Hx AFlutter: h/o ablation 2012, with refractory AFib s/p cardioversion 11/2014 to NSR; on Xarelto and Flecanide. Of note she had been off of both of these meds for past 3-4 days due to nausea. Pt was in NSR overnight, with HRs ranging from 70-97. RRR on exam this morning. - Cont Flecanide 50 BID and Xarelto 20mg  daily  HTN: stable on admission. BPs ranging from 143-177/76-78 since admission. - Continue home Atenolol  - Holding HCTZ. Will consider restarting this afternoon if she has good PO intake.  Prolonged QTc: EKG on admit shows QTc of 550, NSR, with PAC.  - Repeat EKG is pending.   Hx L breast cancer: s/p lumpectomy 03/2005 with adjuvant radiation therapy and 97yr Arimidex completed 2012 (now obs)  GERD: - Home PPI - protonix IV until tolerating PO  T11 Compression Fracture, subacute (02/04/15) Recent history of fall >2 months ago, subsequently evaluated in ED 10/27 on CT imaging confirms acute T11 compression fracture. Treated with pain control. Suspect osteoporosis. May be worsened with poor nutrition with alcohol. - Vitamin D level pending.  FEN/GI: Advance to clear diet this am; IVF, IV Protonix Prophylaxis: on Xarelto for Afib.  Disposition: Pending clinical improvement. Anticipate home in 1-2  days.   Subjective:  Pt states she is feeling much better this morning. Her nausea, vomiting, and abdominal pain have all resolved. She would like to start on clears today.  Objective: Temp:  [98.1 F (36.7 C)-98.7 F (37.1 C)] 98.7 F (37.1 C) (11/30 0456) Pulse Rate:  [70-97] 70 (11/30 0600) Resp:  [16-20] 16 (11/30  0456) BP: (143-177)/(71-134) 158/77 mmHg (11/30 0600) SpO2:  [94 %-100 %] 99 % (11/30 0456) Weight:  [124 lb 3.2 oz (56.337 kg)-148 lb (67.132 kg)] 148 lb (67.132 kg) (11/30 0456) Physical Exam: General: Elderly woman, laying in bed, appears comfortable, in NAD HEENT: [place]/AT, EOMI, MMM Neck: normal ROM Cardiovascular: RRR, no m/r/g Respiratory: CTAB, normal effort Abdomen: + bowel sounds, some "soreness" to palpation of the epigastric area, otherwise no tenderness, non-distended, soft, no rebound or guarding. MSK: no LE edema, able to move extremities without difficulty  Skin: warm and dry, no rashes  Neuro: Awake, alert, no focal deficits grossly, normal speech Psych: Mood and affect euthymic, normal rate and volume of speech  Laboratory:  Recent Labs Lab 03/09/15 0845 03/10/15 0446  WBC 15.2* 15.2*  HGB 13.7 10.5*  HCT 41.2 32.4*  PLT 198 145*    Recent Labs Lab 03/09/15 0845 03/10/15 0446  NA 132* 136  K 3.2* 2.5*  CL 94* 102  CO2 25 26  BUN 7 5*  CREATININE 0.93 0.77  CALCIUM 9.4 8.0*  PROT 6.9  --   BILITOT 1.8*  --   ALKPHOS 100  --   ALT 28  --   AST 48*  --   GLUCOSE 144* 74   Lipase 548 Lactic acid 3.47 > 1.11 UA: Rare bacteria, moderate bili, negative leukocytes, negative nitrites, 0-5 WBC  Imaging/Diagnostic Tests: CTA abd/pelvis (11/29): No evidence of hemodynamically significant stenosis of the mesenteric vasculature. Increased edema surrounding the pancreatic head with fluid along the pericolic gutters and inflammation involving the splenic flexure of the colon. Consistent with acute pancreatitis. No organized fluid collections.  Karina Hua, MD 03/10/2015, 7:30 AM PGY-1, [place] Intern pager: [phone], text pages welcome

## 2015-03-10 NOTE — Progress Notes (Signed)
Initial Nutrition Assessment  DOCUMENTATION CODES:   Not applicable  INTERVENTION:   -Boost Breeze po BID, each supplement provides 250 kcal and 9 grams of protein  NUTRITION DIAGNOSIS:   Inadequate oral intake related to altered GI function as evidenced by  (clear liquid diet).  GOAL:   Patient will meet greater than or equal to 90% of their needs  MONITOR:   PO intake, Supplement acceptance, Diet advancement, Labs, Weight trends, Skin, I & O's  REASON FOR ASSESSMENT:   Malnutrition Screening Tool    ASSESSMENT:   74 y/o female with PMH afib/aflutter, alcohol abuse, HTN, and history of breast CA s/p lumpectomy in 2006 presents with acute pancreatitis. Her symptoms are likely related to chronic alcohol use (3-4 beers per day). CT negative for gallbladder etiology. Will admit for IVF resuscitation, NPO, and pain control.  Pt admitted with acute alcoholic pancreatitis.   Pt was sleeping soundly and snoring audibly at time of visit. Unable to arouse pt for interview. Pt was wrapped in multiple blankets at time of visit. Unable to complete Nutrition-Focused physical exam at this time.   Pt was just advanced to clear liquids this AM. RD observed tray on bedside table. Pt tolerated tray well- noted pt consumed 100% of fruit juice, 75% of broth, and 50% of jello.   Wt hx reviewed. Noted progressive wt loss over the past 9 months, however, no changes are significant for time frame.   RD will add Boost Breeze supplement to promote nutritional adequacy.   Labs reviewed: K: 2.5 (on IV supplementation), Mg: 1.6, lipase: 548.   Diet Order:  Diet clear liquid Room service appropriate?: Yes; Fluid consistency:: Thin  Skin:  Reviewed, no issues  Last BM:  03/09/15  Height:   Ht Readings from Last 1 Encounters:  03/09/15 5\' 4"  (1.626 m)    Weight:   Wt Readings from Last 1 Encounters:  03/10/15 148 lb (67.132 kg)    Ideal Body Weight:  56.8 kg  BMI:  Body mass index is  25.39 kg/(m^2).  Estimated Nutritional Needs:   Kcal:  1600-1800  Protein:  70-85 grams  Fluid:  1.6-1.8 L  EDUCATION NEEDS:   Education needs no appropriate at this time  Herman Fiero A. Jimmye Norman, RD, LDN, CDE Pager: [phone] After hours Pager: [phone]

## 2015-03-11 LAB — CBC
HEMATOCRIT: 32 % — AB (ref 36.0–46.0)
HEMOGLOBIN: 10.2 g/dL — AB (ref 12.0–15.0)
MCH: 28.4 pg (ref 26.0–34.0)
MCHC: 31.9 g/dL (ref 30.0–36.0)
MCV: 89.1 fL (ref 78.0–100.0)
Platelets: 151 10*3/uL (ref 150–400)
RBC: 3.59 MIL/uL — ABNORMAL LOW (ref 3.87–5.11)
RDW: 18.1 % — ABNORMAL HIGH (ref 11.5–15.5)
WBC: 11.3 10*3/uL — AB (ref 4.0–10.5)

## 2015-03-11 LAB — BASIC METABOLIC PANEL
Anion gap: 7 (ref 5–15)
BUN: 5 mg/dL — AB (ref 6–20)
CO2: 20 mmol/L — AB (ref 22–32)
CREATININE: 0.59 mg/dL (ref 0.44–1.00)
Calcium: 7.9 mg/dL — ABNORMAL LOW (ref 8.9–10.3)
Chloride: 104 mmol/L (ref 101–111)
GFR calc non Af Amer: >60 mL/min (ref >60)
Glucose, Bld: 104 mg/dL — ABNORMAL HIGH (ref 65–99)
Potassium: 4.7 mmol/L (ref 3.5–5.1)
SODIUM: 131 mmol/L — AB (ref 135–145)

## 2015-03-11 LAB — VITAMIN D 25 HYDROXY (VIT D DEFICIENCY, FRACTURES): VIT D 25 HYDROXY: 51 ng/mL (ref 30.0–100.0)

## 2015-03-11 MED ORDER — POTASSIUM CHLORIDE CRYS ER 20 MEQ PO TBCR: 40.0000 meq | EXTENDED_RELEASE_TABLET | Freq: Every day | ORAL | Status: DC

## 2015-03-11 NOTE — Progress Notes (Signed)
Family Medicine Teaching Service Daily Progress Note Intern Pager: [phone]  Patient name: Karina Tran Medical record number: [id number] Date of birth: 01/23/1941 Age: 74 y.o. Gender: female  Primary Care Provider: Curly Rim, MD Consultants: None Code Status: Full  Pt Overview and Major Events to Date:  11/29: Admitted to FMTS for acute alcoholic pancreatitis [id number]: Transitioned to PO pain medications   Assessment and Plan: Karina Tran is a 74 y.o. female presenting with abdominal pain, N/V, found to have elevated lipase and acute pancreatitis on CT. PMH significant for hx breast cancer in L breast s/p lumpectomy 03/2005 with adjuvant radiation therapy and 38yr Arimidex completed 2012 (now obs), hx of Aflutter (s/p cardioversion 11/2014 to NSR; on Xarelto).   Acute Pancreatitis, uncomplicated, possibly from alcohol use: Lipase elevated to 548. CT abd/pelv consistent with acute pancreatitis with increased edema around pancreatic head and inflammation involving splenic flexure of colon. No signs of complications such as organized fluid collections. No signs of cholelithiasis or cholecystitis on CT and no new meds  Patient's vitals are hemodynamically stable and she is afebrile. Lactic acid elevated to 3.47 in the ED > 1.11. Leukocytosis to 15.2, but no signs or symptoms of infection; possibly due to hemoconcentration. Leukocytosis improving to 11.3 on 12/1.  - patient currently on clear liquid diet and tolerating well, SLIV and d/c MIVF  - Oxycodone IR 5 mg q3 hours prn for pain; did not require any doses overnight, took one dose this AM for pain from R PIV  -advance diets to softs  - Holding antiemetics for now due to prolonged QTc  - Lipid panel: Chol 144, TG 37, HDL 68, LDL 69   Alcohol Use: Drinks 3-4 beers daily. Per patient last drink was about 6 days ago. No past history of withdrawal symptoms/seizures per patient. Low risk for withdrawal. CIWA scores were 0 on  hospital day #1 and CIWA was d/c.  - Continue Thiamine, Multivitamin  Hypokalemia: On admission K 3.2 and subsequently dropped to 2.5. Magnesium is slightly low at 1.6. Received 3 runs of K on 11/30 and was given Kdur 40 meq. Magnesium repleted as well.  - Will monitor with daily BMETs -Kdur 65meq BID   Hx AFlutter: h/o ablation 2012, with refractory AFib s/p cardioversion 11/2014 to NSR; on Xarelto and Flecanide. Of note she had been off of both of these meds for past 3-4 days due to nausea. HR has been stable.  - Cont Flecanide 50 BID and Xarelto 20mg  daily  HTN: stable on admission. BPs ranging from 143-177/76-78 since admission. - Continue home Atenolol  - Holding HCTZ. Consider restarting due to elevated BPs   Prolonged QTc: EKG on admit shows QTc of 550, NSR, with PAC. Repeat EKG QTc of 490.   Hx L breast cancer: s/p lumpectomy 03/2005 with adjuvant radiation therapy and 60yr Arimidex completed 2012 (now obs)  GERD: - Home PPI  T11 Compression Fracture, subacute (02/04/15) Recent history of fall >2 months ago, subsequently evaluated in ED 10/27 on CT imaging confirms acute T11 compression fracture. Treated with pain control. Suspect osteoporosis. May be worsened with poor nutrition with alcohol. Vitamin D level WNL at 51.    FEN/GI: Advance to clear diet this am; IVF, IV Protonix Prophylaxis: on Xarelto for Afib.  Disposition: Pending clinical improvement. Anticipate home in 1-2 days.   Subjective:  Feeling better this AM. No N/V. Wishes to advance diet. Complaining mostly of pain at right peripheral IV site.   Objective: Temp:  [  98.4 F (36.9 C)-99.4 F (37.4 C)] 98.4 F (36.9 C) (12/01 0705) Pulse Rate:  [81-88] 88 (12/01 0705) Resp:  [18-20] 18 (12/01 0705) BP: (142-175)/(65-86) 175/86 mmHg (12/01 0705) SpO2:  [95 %-100 %] 99 % (12/01 0705) Weight:  [148 lb 13 oz (67.5 kg)] 148 lb 13 oz (67.5 kg) (12/01 0700) Physical Exam: General: Elderly woman, laying in bed,  appears comfortable, in NAD HEENT: [place]/AT, EOMI, MMM Neck: normal ROM Cardiovascular: RRR, no m/r/g Respiratory: CTAB, normal effort Abdomen: + bowel sounds, some "soreness" to palpation of the epigastric area, otherwise no tenderness, non-distended, soft, no rebound or guarding. MSK: no LE edema, able to move extremities without difficulty  Skin: warm and dry, no rashes, no erythema or edema at PIV  Neuro: Awake, alert, no focal deficits grossly, normal speech Psych: Mood and affect euthymic, normal rate and volume of speech  Laboratory:  Recent Labs Lab 03/09/15 0845 03/10/15 0446  WBC 15.2* 15.2*  HGB 13.7 10.5*  HCT 41.2 32.4*  PLT 198 145*    Recent Labs Lab 03/09/15 0845 03/10/15 0446 03/10/15 1542  NA 132* 136 131*  K 3.2* 2.5* 3.1*  CL 94* 102 100*  CO2 25 26 24   BUN 7 5* 5*  CREATININE 0.93 0.77 0.67  CALCIUM 9.4 8.0* 8.0*  PROT 6.9  --   --   BILITOT 1.8*  --   --   ALKPHOS 100  --   --   ALT 28  --   --   AST 48*  --   --   GLUCOSE 144* 74 137*   Lipase 548 Lactic acid 3.47 > 1.11 UA: Rare bacteria, moderate bili, negative leukocytes, negative nitrites, 0-5 WBC  Imaging/Diagnostic Tests: CTA abd/pelvis (11/29): No evidence of hemodynamically significant stenosis of the mesenteric vasculature. Increased edema surrounding the pancreatic head with fluid along the pericolic gutters and inflammation involving the splenic flexure of the colon. Consistent with acute pancreatitis. No organized fluid collections.  Karina Tran 03/11/2015, 7:10 AM PGY-1, [place] Intern pager: [phone], text pages welcome

## 2015-03-11 NOTE — Discharge Instructions (Addendum)
You were hospitalized for acute pancreatitis. This could have been caused by drinking alcohol. While your pancreas continues to heal, we recommend eating bland foods (bananas, breads, rice, applesauce), avoiding spicy foods, and avoiding alcohol.   You were found to have low potassium during your hospitalization. We are discharging you with a potassium supplement that you should take daily until you follow up with your primary care doctor.      Information on my medicine - XARELTO (Rivaroxaban)  This medication education was reviewed with me or my healthcare representative as part of my discharge preparation.  The pharmacist that spoke with me during my hospital stay was:  Wayland Salinas, [facility]  Why was Xarelto prescribed for you? Xarelto was prescribed for you to reduce the risk of a blood clot forming that can cause a stroke if you have a medical condition called atrial fibrillation (a type of irregular heartbeat).  What do you need to know about xarelto ? Take your Xarelto ONCE DAILY at the same time every day with your evening meal. If you have difficulty swallowing the tablet whole, you may crush it and mix in applesauce just prior to taking your dose.  Take Xarelto exactly as prescribed by your doctor and DO NOT stop taking Xarelto without talking to the doctor who prescribed the medication.  Stopping without other stroke prevention medication to take the place of Xarelto may increase your risk of developing a clot that causes a stroke.  Refill your prescription before you run out.  After discharge, you should have regular check-up appointments with your healthcare provider that is prescribing your Xarelto.  In the future your dose may need to be changed if your kidney function or weight changes by a significant amount.  What do you do if you miss a dose? If you are taking Xarelto ONCE DAILY and you miss a dose, take it as soon as you remember on the same day then  continue your regularly scheduled once daily regimen the next day. Do not take two doses of Xarelto at the same time or on the same day.   Important Safety Information A possible side effect of Xarelto is bleeding. You should call your healthcare provider right away if you experience any of the following: ? Bleeding from an injury or your nose that does not stop. ? Unusual colored urine (red or dark brown) or unusual colored stools (red or black). ? Unusual bruising for unknown reasons. ? A serious fall or if you hit your head (even if there is no bleeding).  Some medicines may interact with Xarelto and might increase your risk of bleeding while on Xarelto. To help avoid this, consult your healthcare provider or pharmacist prior to using any new prescription or non-prescription medications, including herbals, vitamins, non-steroidal anti-inflammatory drugs (NSAIDs) and supplements.  This website has more information on Xarelto: [email or web address]

## 2015-03-12 LAB — BASIC METABOLIC PANEL
Anion gap: 6 (ref 5–15)
BUN: <5 mg/dL — ABNORMAL LOW (ref 6–20)
CO2: 22 mmol/L (ref 22–32)
Calcium: 8.4 mg/dL — ABNORMAL LOW (ref 8.9–10.3)
Chloride: 101 mmol/L (ref 101–111)
Creatinine, Ser: 0.61 mg/dL (ref 0.44–1.00)
GFR calc Af Amer: >60 mL/min (ref >60)
GFR calc non Af Amer: >60 mL/min (ref >60)
Glucose, Bld: 107 mg/dL — ABNORMAL HIGH (ref 65–99)
Potassium: 4.4 mmol/L (ref 3.5–5.1)
Sodium: 129 mmol/L — ABNORMAL LOW (ref 135–145)

## 2015-03-12 LAB — CBC
HCT: 31 % — ABNORMAL LOW (ref 36.0–46.0)
HEMOGLOBIN: 9.9 g/dL — AB (ref 12.0–15.0)
MCH: 28.3 pg (ref 26.0–34.0)
MCHC: 31.9 g/dL (ref 30.0–36.0)
MCV: 88.6 fL (ref 78.0–100.0)
PLATELETS: 187 10*3/uL (ref 150–400)
RBC: 3.5 MIL/uL — AB (ref 3.87–5.11)
RDW: 18 % — ABNORMAL HIGH (ref 11.5–15.5)
WBC: 8.9 10*3/uL (ref 4.0–10.5)

## 2015-03-12 NOTE — Progress Notes (Signed)
Discharge instructions given. Pt verbalized understanding and all questions were answered.  

## 2015-03-12 NOTE — Care Management Note (Signed)
Case Management Note  Patient Details  Name: Tashera Ruth MRN: [id number] Date of Birth: 1940/10/22  Subjective/Objective:    Date: 03/12/15 Spoke with patient at the bedside.  Introduced self as Tourist information centre manager and explained role in discharge planning and how to be reached.  Verified patient lives in [place], alone with daughter.  Expressed potential need for no other DME.  Verified patient anticipates to go home with family, at time of discharge and will have full-time supervision by family at this time to best of their knowledge. Patient denied needing help with their medication.  Patient is driven by daughter to MD appointments.  Verified patient has PCP Kip Corrington.   Plan: CM will continue to follow for discharge planning and [facility] resources.                 Action/Plan:   Expected Discharge Date:                  Expected Discharge Plan:  Home/Self Care  In-House Referral:     Discharge planning Services  CM Consult  Post Acute Care Choice:    Choice offered to:     DME Arranged:    DME Agency:     HH Arranged:    [place] Agency:     Status of Service:  Completed, signed off  Medicare Important Message Given:    Date Medicare IM Given:    Medicare IM give by:    Date Additional Medicare IM Given:    Additional Medicare Important Message give by:     If discussed at [place] of Stay Meetings, dates discussed:    Additional Comments:  Zenon Mayo, RN 03/12/2015, 11:01 AM

## 2015-03-12 NOTE — Care Management Important Message (Signed)
Important Message  Patient Details  Name: Karina Tran MRN: [id number] Date of Birth: [birth year]   Medicare Important Message Given:  Yes    [place] Thielman P Rosalena Mccorry 03/12/2015, 2:20 PM

## 2015-04-11 HISTORY — PX: KYPHOPLASTY: SHX5884

## 2015-05-06 ENCOUNTER — Telehealth: Payer: Self-pay | Admitting: Hematology and Oncology

## 2015-05-06 NOTE — Telephone Encounter (Signed)
Left message for patient in regards to appointment change due to provider being out of the office. Schedule sent by mail.

## 2015-05-13 ENCOUNTER — Other Ambulatory Visit: Payer: Self-pay

## 2015-05-13 DIAGNOSIS — Z1231 Encounter for screening mammogram for malignant neoplasm of breast: Secondary | ICD-10-CM

## 2015-06-03 ENCOUNTER — Telehealth: Payer: Self-pay

## 2015-06-03 NOTE — Telephone Encounter (Signed)
Attempted to return call to re: mammo referral.  No answer.  Chart reviewed - mammo order is in and mammo is scheduled.

## 2015-06-09 ENCOUNTER — Other Ambulatory Visit: Payer: Self-pay | Admitting: *Deleted

## 2015-06-22 ENCOUNTER — Ambulatory Visit
Admission: RE | Admit: 2015-06-22 | Discharge: 2015-06-22 | Disposition: A | Discharge status: Home / Self Care | Payer: Medicare Other | Source: Ambulatory Visit

## 2015-06-22 DIAGNOSIS — Z1231 Encounter for screening mammogram for malignant neoplasm of breast: Secondary | ICD-10-CM

## 2015-06-25 ENCOUNTER — Ambulatory Visit: Payer: Medicare Other | Admitting: Hematology and Oncology

## 2015-06-29 NOTE — Assessment & Plan Note (Signed)
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 Currently on observation  Breast cancer surveillance: 1. Breast exam 06/29/15 is normal 2. Mammogram 06/23/2015 is normal 3. I discussed with her that there is no role of routine imaging on routine blood work in surveillance for breast cancer based on the NCCN guidelines.  Survivorship:Discussed the importance of physical exercise in decreasing the likelihood of breast cancer recurrence. Recommended 30 mins daily 6 days a week of either brisk walking or cycling or swimming. Encouraged patient to eat more fruits and vegetables and decrease red meat.   Return to clinic in 1 year for follow-up 

## 2015-06-30 ENCOUNTER — Encounter: Payer: Self-pay | Admitting: Hematology and Oncology

## 2015-06-30 ENCOUNTER — Ambulatory Visit (HOSPITAL_BASED_OUTPATIENT_CLINIC_OR_DEPARTMENT_OTHER): Payer: Medicare Other | Admitting: Hematology and Oncology

## 2015-06-30 ENCOUNTER — Telehealth: Payer: Self-pay | Admitting: Hematology and Oncology

## 2015-06-30 VITALS — BP 154/71 | HR 73 | Temp 97.5°F | Resp 18 | Wt 147.6 lb

## 2015-06-30 DIAGNOSIS — Z853 Personal history of malignant neoplasm of breast: Secondary | ICD-10-CM

## 2015-06-30 DIAGNOSIS — C50412 Malignant neoplasm of upper-outer quadrant of left female breast: Secondary | ICD-10-CM

## 2015-06-30 NOTE — Progress Notes (Signed)
Patient Care Team: Curly Rim, MD as PCP - General (Family Medicine)  DIAGNOSIS: No matching staging information was found for the patient.  SUMMARY OF ONCOLOGIC HISTORY:   Breast cancer of upper-outer quadrant of left female breast ([place])   03/24/2005 Surgery Left breast lumpectomy 1.5cm tumor, grade 2, 2 sentinel nodes negative, ER 83% PR 0% HER-2 negative ratio 0.7, Ki-67 0% T1c N0 M0 stage IA   04/26/2005 - 06/02/2005 Radiation Therapy Adjuvant radiation therapy   06/26/2005 - 06/24/2010 Anti-estrogen oral therapy Adjuvant antiestrogen therapy with Arimidex 1 mg daily 5 years    CHIEF COMPLIANT: Surveillance of breast cancer  INTERVAL HISTORY: Karina Tran is a 75 year old with above-mentioned left breast cancer treated with lumpectomy adjuvant radiation and completed 5 years of antiestrogen therapy in 2012. She is here for annual follow-up. She reports mild left breast discomfort but denies any lumps or nodules. She had a fall in October 2016 and had a compression fracture for which she is seeing an Doctor, general practice.  REVIEW OF SYSTEMS:   Constitutional: Denies fevers, chills or abnormal weight loss Eyes: Denies blurriness of vision Ears, nose, mouth, throat, and face: Denies mucositis or sore throat Respiratory: Denies cough, dyspnea or wheezes Cardiovascular: Denies palpitation, chest discomfort Gastrointestinal:  Denies nausea, heartburn or change in bowel habits Skin: Denies abnormal skin rashes Lymphatics: Denies new lymphadenopathy or easy bruising Neurological:Denies numbness, tingling or new weaknesses Behavioral/Psych: Mood is stable, no new changes  Extremities: No lower extremity edema Breast:  denies any pain or lumps or nodules in either breasts All other systems were reviewed with the patient and are negative.  I have reviewed the past medical history, past surgical history, social history and family history with the patient and they are unchanged from  previous note.  ALLERGIES:  is allergic to aspirin and demerol.  MEDICATIONS:  Current Outpatient Prescriptions  Medication Sig Dispense Refill  . atenolol (TENORMIN) 50 MG tablet Take 50 mg by mouth daily.    . Coenzyme Q10 (COQ10) 200 MG CAPS Take 1 tablet by mouth daily.     Marland Kitchen docusate sodium (COLACE) 100 MG capsule Take 1 capsule (100 mg total) by mouth every 12 (twelve) hours. (Patient taking differently: Take 100 mg by mouth 2 (two) times daily as needed for mild constipation. ) 60 capsule 0  . flecainide (TAMBOCOR) 50 MG tablet Take 1 tablet (50 mg total) by mouth every 12 (twelve) hours. 60 tablet 0  . hydrochlorothiazide (HYDRODIURIL) 25 MG tablet Take 25 mg by mouth daily.    Marland Kitchen HYDROcodone-acetaminophen (NORCO/VICODIN) 5-325 MG tablet Take 1 tablet by mouth every 4 (four) hours as needed. (Patient taking differently: Take 1 tablet by mouth every 4 (four) hours as needed for severe pain. ) 30 tablet 0  . pantoprazole (PROTONIX) 40 MG tablet Take 40 mg by mouth daily.    . potassium chloride SA (K-DUR,KLOR-CON) 20 MEQ tablet Take 2 tablets (40 mEq total) by mouth daily. 20 tablet 0  . rivaroxaban (XARELTO) 20 MG TABS tablet Take 1 tablet (20 mg total) by mouth daily with supper. 30 tablet 0  . UNABLE TO FIND Take 1 tablet by mouth daily. Calcium 600 mg with Vitamin D3     No current facility-administered medications for this visit.    PHYSICAL EXAMINATION: ECOG PERFORMANCE STATUS: 1 - Symptomatic but completely ambulatory  Filed Vitals:   06/30/15 1058  BP: 154/71  Pulse: 73  Temp: 97.5 F (36.4 C)  Resp: 18  Filed Weights   06/30/15 1058  Weight: 147 lb 9.6 oz (66.951 kg)    GENERAL:alert, no distress and comfortable SKIN: skin color, texture, turgor are normal, no rashes or significant lesions EYES: normal, Conjunctiva are pink and non-injected, sclera clear OROPHARYNX:no exudate, no erythema and lips, buccal mucosa, and tongue normal  NECK: supple, thyroid normal  size, non-tender, without nodularity LYMPH:  no palpable lymphadenopathy in the cervical, axillary or inguinal LUNGS: clear to auscultation and percussion with normal breathing effort HEART: regular rate & rhythm and no murmurs and no lower extremity edema ABDOMEN:abdomen soft, non-tender and normal bowel sounds MUSCULOSKELETAL:no cyanosis of digits and no clubbing  NEURO: alert & oriented x 3 with fluent speech, no focal motor/sensory deficits EXTREMITIES: No lower extremity edema BREAST: No palpable masses or nodules in either right or left breasts. No palpable axillary supraclavicular or infraclavicular adenopathy no breast tenderness or nipple discharge. (exam performed in the presence of a chaperone)  LABORATORY DATA:  I have reviewed the data as listed   Chemistry      Component Value Date/Time   NA 129* 03/12/2015 0641   K 4.4 03/12/2015 0641   CL 101 03/12/2015 0641   CO2 22 03/12/2015 0641   BUN <5* 03/12/2015 0641   CREATININE 0.61 03/12/2015 0641      Component Value Date/Time   CALCIUM 8.4* 03/12/2015 0641   ALKPHOS 100 03/09/2015 0845   AST 48* 03/09/2015 0845   ALT 28 03/09/2015 0845   BILITOT 1.8* 03/09/2015 0845       Lab Results  Component Value Date   WBC 8.9 03/12/2015   HGB 9.9* 03/12/2015   HCT 31.0* 03/12/2015   MCV 88.6 03/12/2015   PLT 187 03/12/2015   NEUTROABS 13.1* 03/09/2015     ASSESSMENT & PLAN:  Breast cancer of upper-outer quadrant of left female breast ([place]) 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 Currently on observation  Breast cancer surveillance: 1. Breast exam 06/29/15 is normal 2. Mammogram 06/23/2015 is normal 3. I discussed with her that there is no role of routine imaging on routine blood work in surveillance for breast  cancer based on the NCCN guidelines.  Survivorship:Discussed the importance of physical exercise in decreasing the likelihood of breast cancer recurrence. Recommended 30 mins daily 6 days a week of either brisk walking or cycling or swimming. Encouraged patient to eat more fruits and vegetables and decrease red meat.   Return to clinic in 1 year for follow-up With survivorship clinic   No orders of the defined types were placed in this encounter.   The patient has a good understanding of the overall plan. she agrees with it. she will call with any problems that may develop before the next visit here.   Rulon Eisenmenger, MD 06/30/2015

## 2015-06-30 NOTE — Progress Notes (Signed)
Unable to get in to exam room prior to MD.  No assessment performed.  

## 2015-06-30 NOTE — Telephone Encounter (Signed)
appt made and avs printed °

## 2015-09-01 ENCOUNTER — Other Ambulatory Visit: Payer: Self-pay | Admitting: Orthopaedic Surgery

## 2015-09-01 DIAGNOSIS — S22000A Wedge compression fracture of unspecified thoracic vertebra, initial encounter for closed fracture: Secondary | ICD-10-CM

## 2015-09-01 DIAGNOSIS — S32000S Wedge compression fracture of unspecified lumbar vertebra, sequela: Secondary | ICD-10-CM

## 2015-09-11 ENCOUNTER — Other Ambulatory Visit: Payer: Self-pay

## 2015-09-14 ENCOUNTER — Ambulatory Visit
Admission: RE | Admit: 2015-09-14 | Discharge: 2015-09-14 | Disposition: A | Discharge status: Home / Self Care | Payer: Medicare Other | Source: Ambulatory Visit | Attending: Orthopaedic Surgery | Admitting: Orthopaedic Surgery

## 2015-09-14 DIAGNOSIS — S22000A Wedge compression fracture of unspecified thoracic vertebra, initial encounter for closed fracture: Secondary | ICD-10-CM

## 2015-09-14 DIAGNOSIS — S32000S Wedge compression fracture of unspecified lumbar vertebra, sequela: Secondary | ICD-10-CM

## 2016-03-22 ENCOUNTER — Telehealth: Payer: Self-pay | Admitting: Pulmonary Disease

## 2016-03-22 ENCOUNTER — Ambulatory Visit (INDEPENDENT_AMBULATORY_CARE_PROVIDER_SITE_OTHER): Payer: Medicare Other | Admitting: Pulmonary Disease

## 2016-03-22 ENCOUNTER — Encounter: Payer: Self-pay | Admitting: Pulmonary Disease

## 2016-03-22 ENCOUNTER — Other Ambulatory Visit (INDEPENDENT_AMBULATORY_CARE_PROVIDER_SITE_OTHER): Payer: Medicare Other

## 2016-03-22 DIAGNOSIS — R0602 Shortness of breath: Secondary | ICD-10-CM

## 2016-03-22 DIAGNOSIS — M1990 Unspecified osteoarthritis, unspecified site: Secondary | ICD-10-CM | POA: Insufficient documentation

## 2016-03-22 DIAGNOSIS — J302 Other seasonal allergic rhinitis: Secondary | ICD-10-CM | POA: Insufficient documentation

## 2016-03-22 DIAGNOSIS — K219 Gastro-esophageal reflux disease without esophagitis: Secondary | ICD-10-CM | POA: Insufficient documentation

## 2016-03-22 DIAGNOSIS — R918 Other nonspecific abnormal finding of lung field: Secondary | ICD-10-CM | POA: Diagnosis not present

## 2016-03-22 LAB — CBC WITH DIFFERENTIAL/PLATELET
BASOS PCT: 0.1 % (ref 0.0–3.0)
Basophils Absolute: 0 10*3/uL (ref 0.0–0.1)
EOS PCT: 1.3 % (ref 0.0–5.0)
Eosinophils Absolute: 0.1 10*3/uL (ref 0.0–0.7)
HEMATOCRIT: 34 % — AB (ref 36.0–46.0)
HEMOGLOBIN: 10.9 g/dL — AB (ref 12.0–15.0)
Lymphocytes Relative: 18.5 % (ref 12.0–46.0)
Lymphs Abs: 1.7 10*3/uL (ref 0.7–4.0)
MCHC: 32 g/dL (ref 30.0–36.0)
MCV: 75 fl — AB (ref 78.0–100.0)
MONO ABS: 0.6 10*3/uL (ref 0.1–1.0)
MONOS PCT: 6.6 % (ref 3.0–12.0)
Neutro Abs: 6.7 10*3/uL (ref 1.4–7.7)
Neutrophils Relative %: 73.5 % (ref 43.0–77.0)
Platelets: 304 10*3/uL (ref 150.0–400.0)
RBC: 4.54 Mil/uL (ref 3.87–5.11)
RDW: 21.5 % — AB (ref 11.5–15.5)
WBC: 9.2 10*3/uL (ref 4.0–10.5)

## 2016-03-22 NOTE — Telephone Encounter (Signed)
IMAGING CTA ABDOMEN/PELVIS 03/09/15 (personally reviewed by me): Lung windows reviewed. Intralobular septal thickening and patchy opacification in bilateral lung bases. Suggestion of pleural thickening but no pleural effusion.  CXR PA/LAT 11/16/14 (personally reviewed by me): Questionable left basilar opacity. No pleural effusion. Heart normal in size & mediastinum normal in contour. No other parenchymal mass or opacification appreciated.

## 2016-03-22 NOTE — Patient Instructions (Addendum)
   Call me if you have any new breathing problems before your next appointment.  We may contact you to have more blood work done depending on your chest CT scan results.   I will see you back in 6 weeks or sooner if needed. We will discuss your test results then.   TESTS ORDERED: 1. Full PFTs before next appointment 2. 6MWT on room air before next appointment 3. HRCT Chest W/O before next appointment 4. Serum CBC with differential & RAST Panel today

## 2016-03-22 NOTE — Progress Notes (Signed)
Subjective:    Patient ID: Karina Tran, female    DOB: [birth year], 75 y.o.   MRN: [id number]  HPI She reports she starting having breathing problems over the last 2 years. She reports it is progressively worsening. She has noticed dyspnea only with exertion. Does wheeze at times. She reports she has had a cough since 75 y.o. She was previously told it was secondary to the Atenolol. The cough doesn't seem to be worsening. She reports she coughs primarily in the morning producing a mucus that she swallows. She does cough intermittently throughout the day. No nocturnal awakenings with any coughing, wheezing, or dyspnea. No chest tightness, pain, or pressure. No fever, chills, or sweats. Denies any reflux, dyspepsia or morning brash water taste on Protonix. She reports she has noticed sinus congestion with drainage and "runny" eyes since she moved to [place]. She reports she has had bronchitis in the past. No history of asthma or allergies as a child or young adult.   Review of Systems No rashes or bruising. She does have chronic pain in her knees and hips. She does report some swelling and inflammation in her knees. No stiffness in her hands. A pertinent 14 point review of systems is negative except as per the history of presenting illness.  Allergies  Allergen Reactions  . Aspirin     Bloody noses  . Demerol [Meperidine]     vomiting    Current Outpatient Prescriptions on File Prior to Visit  Medication Sig Dispense Refill  . atenolol (TENORMIN) 50 MG tablet Take 50 mg by mouth daily.    . Coenzyme Q10 (COQ10) 200 MG CAPS Take 1 tablet by mouth daily.     Marland Kitchen docusate sodium (COLACE) 100 MG capsule Take 1 capsule (100 mg total) by mouth every 12 (twelve) hours. (Patient taking differently: Take 100 mg by mouth 2 (two) times daily as needed for mild constipation. ) 60 capsule 0  . flecainide (TAMBOCOR) 50 MG tablet Take 1 tablet (50 mg total) by mouth every 12 (twelve) hours. 60 tablet 0  .  hydrochlorothiazide (HYDRODIURIL) 25 MG tablet Take 25 mg by mouth daily.    . pantoprazole (PROTONIX) 40 MG tablet Take 40 mg by mouth daily.    . rivaroxaban (XARELTO) 20 MG TABS tablet Take 1 tablet (20 mg total) by mouth daily with supper. 30 tablet 0  . UNABLE TO FIND Take 1 tablet by mouth daily. Calcium 600 mg with Vitamin D3    . potassium chloride SA (K-DUR,KLOR-CON) 20 MEQ tablet Take 2 tablets (40 mEq total) by mouth daily. (Patient not taking: Reported on 03/22/2016) 20 tablet 0   No current facility-administered medications on file prior to visit.     Past Medical History:  Diagnosis Date  . Allergic rhinitis   . Breast cancer ([place])       . Complication of anesthesia    " TAKES ALONG TIME TO WAKE ME "  . GERD (gastroesophageal reflux disease)   . History of kidney infection   . Hypertension   . Persistent atrial fibrillation ([facility])    a. s/p PVI 2009 in [place]  . Spinal stenosis     Past Surgical History:  Procedure Laterality Date  . ABLATION  2009   [place]  . APPENDECTOMY    . BREAST SURGERY     partial mastectomy  . CARDIOVERSION N/A 11/18/2014   Procedure: CARDIOVERSION;  Surgeon: Adrian Prows, MD;  Location: [facility];  Service: Cardiovascular;  Laterality: N/A;  . carpel tunnel    . CESAREAN SECTION    . GANGLION CYST EXCISION    . HEMORRHOID SURGERY    . KYPHOPLASTY  2017  . TONSILLECTOMY      Family History  Problem Relation Age of Onset  . Lung cancer Sister   . Heart failure Mother   . Brain cancer Father   . Lung disease Neg Hx   . Rheumatologic disease Neg Hx     Social History   Social History  . Marital status: Widowed    Spouse name: N/A  . Number of children: N/A  . Years of education: N/A   Social History Main Topics  . Smoking status: Passive Smoke Exposure - Never Smoker  . Smokeless tobacco: Never Used     Comment: exposed to second-hand smoke her entire life  . Alcohol use Yes     Comment: weekley beer  . Drug use: No  .  Sexual activity: Not Asked   Other Topics Concern  . None   Social History Narrative   Live with daughter. Retired.       [facility]:   Originally from [place]. Moved to [place] in 2015. Living with daughter & son-in-law. Remote travel to [place]. 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 No pets currently. Remote parakeet exposure. No mold exposure. Previously enjoyed square dancing and now plays on her computer. Also previously enjoyed camping in her camper but has since sold it.       Objective:   Physical Exam BP 100/68 (BP Location: Left Arm, Cuff Size: Normal)   Pulse 75   Ht 5\' 4"  (1.626 m)   Wt 144 lb (65.3 kg) Comment: Pt refused to weigh in  SpO2 98%   BMI 24.72 kg/m  General:  Awake. Alert. No acute distress.   Integument:  Warm & dry. No rash on exposed skin. No bruising on exposed skin. Lymphatics:  No appreciated cervical or supraclavicular lymphadenoapthy. HEENT:  Moist mucus membranes. No oral ulcers. No scleral injection or icterus. Mild bilateral nasal turbinate swelling. Cardiovascular:  Regular rate with irregular rhythm. No edema. Pulmonary:   Bilateral basilar Velcro crackles. Symmetric chest wall expansion. No accessory muscle useon room air . Abdomen: Soft. Normal bowel sounds. Nondistended.  Musculoskeletal:  Normal bulk and tone. Hand grip strength 5/5 bilaterally. No joint deformity or effusion appreciated. Neurological:  CN 2-12 grossly in tact. No meningismus. Moving all 4 extremities equally. Symmetric brachioradialis deep tendon reflexes. Psychiatric:  Mood and affect congruent. Speech normal rhythm, rate & tone.   IMAGING CTA ABDOMEN/PELVIS 03/09/15 (personally reviewed by me): Lung windows reviewed. Intralobular septal thickening and patchy opacification in bilateral lung bases. Suggestion of pleural thickening but no pleural effusion.  CXR PA/LAT 11/16/14 (personally  reviewed by me): Questionable left basilar opacity. No pleural effusion. Heart normal in size & mediastinum normal in contour. No other parenchymal mass or opacification appreciated.    Assessment & Plan:  75 y.o. female with Progressively worsening shortness of breath as well as evidence of basilar lung opacities on previous abdominal CT imaging. Patient's crackles on physical exam could indicate underlying interstitial lung disease and given her previous exposure to asbestos this is certainly possible. Her reflux seems to be very well-controlled at this time. Her symptoms overall could indicate underlying COPD or even emphysema with her long-standing history of secondhand smoke exposure. We will need to perform further chest imaging as well as  pulmonary function testing to determine the exact cause of her symptoms. Her physical exam does not suggest an autoimmune etiology to her arthritis but depending upon her chest CT imaging further blood work may be necessary and I did communicate this to the patient.   1. Shortness of Breath: Checking 6 minute walk test on room air and full pulmonary function testing before next appointment. 2. GERD: Well-controlled with Protonix. No changes.  3. Basilar Opacities: Checking high-resolution CT chest without contrast before next appointment.  4. Chronic Seasonal Allergic Rhinitis: Checking serum CBC with differential and RAST panel today.  5. Health Maintenance:  S/P Influenza Vaccine September 2017 & Prevnar in 2015. Previously had Pneumovax.  6. Follow-up:  Return to clinic  6 weeks or sooner if needed.   Sonia Baller Ashok Cordia, M.D. [facility] Pager:  [phone] After 3pm or if no response, call [phone] 2:38 PM 03/22/16

## 2016-03-23 LAB — RESPIRATORY ALLERGY PROFILE REGION II ~~LOC~~
Allergen, A. alternata, m6: <0.1 kU/L
Allergen, C. Herbarum, M2: <0.1 kU/L
Allergen, Comm Silver Birch, t9: <0.1 kU/L
Allergen, D pternoyssinus,d7: <0.1 kU/L
Allergen, Mulberry, t76: <0.1 kU/L
Allergen, P. notatum, m1: <0.1 kU/L
Aspergillus fumigatus, m3: <0.1 kU/L
Box Elder IgE: <0.1 kU/L
Cockroach: <0.1 kU/L
Common Ragweed: <0.1 kU/L
D. farinae: <0.1 kU/L
Dog Dander: <0.1 kU/L
Elm IgE: <0.1 kU/L
IgE (Immunoglobulin E), Serum: 10 kU/L (ref <115)
Johnson Grass: <0.1 kU/L
Sheep Sorrel IgE: <0.1 kU/L

## 2016-03-28 ENCOUNTER — Ambulatory Visit (INDEPENDENT_AMBULATORY_CARE_PROVIDER_SITE_OTHER)
Admission: RE | Admit: 2016-03-28 | Discharge: 2016-03-28 | Disposition: A | Discharge status: Home / Self Care | Payer: Medicare Other | Source: Ambulatory Visit | Attending: Pulmonary Disease | Admitting: Pulmonary Disease

## 2016-03-28 DIAGNOSIS — R918 Other nonspecific abnormal finding of lung field: Secondary | ICD-10-CM

## 2016-03-28 DIAGNOSIS — R0602 Shortness of breath: Secondary | ICD-10-CM | POA: Diagnosis not present

## 2016-05-17 ENCOUNTER — Telehealth: Payer: Self-pay | Admitting: Pulmonary Disease

## 2016-05-17 ENCOUNTER — Ambulatory Visit (INDEPENDENT_AMBULATORY_CARE_PROVIDER_SITE_OTHER): Payer: Medicare Other | Admitting: Pulmonary Disease

## 2016-05-17 ENCOUNTER — Other Ambulatory Visit: Payer: Self-pay | Admitting: Family Medicine

## 2016-05-17 ENCOUNTER — Encounter: Payer: Self-pay | Admitting: Pulmonary Disease

## 2016-05-17 VITALS — BP 122/60 | HR 73 | Ht 64.0 in | Wt 154.9 lb

## 2016-05-17 DIAGNOSIS — R0602 Shortness of breath: Secondary | ICD-10-CM

## 2016-05-17 DIAGNOSIS — K219 Gastro-esophageal reflux disease without esophagitis: Secondary | ICD-10-CM

## 2016-05-17 DIAGNOSIS — J302 Other seasonal allergic rhinitis: Secondary | ICD-10-CM

## 2016-05-17 DIAGNOSIS — Z77090 Contact with and (suspected) exposure to asbestos: Secondary | ICD-10-CM

## 2016-05-17 DIAGNOSIS — I7300 Raynaud's syndrome without gangrene: Secondary | ICD-10-CM | POA: Diagnosis not present

## 2016-05-17 DIAGNOSIS — J849 Interstitial pulmonary disease, unspecified: Secondary | ICD-10-CM | POA: Diagnosis not present

## 2016-05-17 DIAGNOSIS — Z1231 Encounter for screening mammogram for malignant neoplasm of breast: Secondary | ICD-10-CM

## 2016-05-17 LAB — PULMONARY FUNCTION TEST
DL/VA % pred: 62 %
DL/VA: 2.92 ml/min/mmHg/L
DLCO COR: 12.69 ml/min/mmHg
DLCO UNC % PRED: 53 %
DLCO cor % pred: 55 %
DLCO unc: 12.37 ml/min/mmHg
FEF 25-75 PRE: 2.68 L/s
FEF 25-75 Post: 3.62 L/sec
FEF2575-%Change-Post: 35 %
FEF2575-%PRED-PRE: 176 %
FEF2575-%Pred-Post: 238 %
FEV1-%Change-Post: 4 %
FEV1-%PRED-PRE: 128 %
FEV1-%Pred-Post: 135 %
FEV1-POST: 2.65 L
FEV1-Pre: 2.53 L
FEV1FVC-%Change-Post: 4 %
FEV1FVC-%Pred-Pre: 108 %
FEV6-%CHANGE-POST: 0 %
FEV6-%PRED-POST: 124 %
FEV6-%PRED-PRE: 124 %
FEV6-PRE: 3.1 L
FEV6-Post: 3.1 L
FEV6FVC-%PRED-POST: 105 %
FEV6FVC-%PRED-PRE: 105 %
FVC-%Change-Post: 0 %
FVC-%PRED-POST: 117 %
FVC-%Pred-Pre: 117 %
FVC-Post: 3.1 L
FVC-Pre: 3.1 L
POST FEV6/FVC RATIO: 100 %
Post FEV1/FVC ratio: 85 %
Pre FEV1/FVC ratio: 81 %
Pre FEV6/FVC Ratio: 100 %
RV % pred: 60 %
RV: 1.38 L
TLC % PRED: 89 %
TLC: 4.39 L

## 2016-05-17 NOTE — Patient Instructions (Addendum)
   Call me if you have any new breathing problems or questions before your next appointment.  I will see you back in 3 months with a breathing & walking test at that time.  TESTS ORDERED: 1. Serum ESR, CRP, ANA Quantitative, RF, Anti-CCP, SSA, SSB, SCL-70, Centromere Antibody Screen, Hypersensitivity Pneumonitis Panel, DS DNA Ab, Smith Ab, & Myositis Antibody Panel.  2. Spirometry with DLCO at next appointment 3. 6MWT on room air at next appointment

## 2016-05-17 NOTE — Progress Notes (Signed)
Subjective:    Patient ID: Karina Tran, female    DOB: 1941/02/05, 76 y.o.   MRN: [id number]  C.C.:  Follow-up for ILD, Bibasilar Opacities, GERD, & Chronic Seasonal Allergic Rhinitis.  HPI ILD: Patient had normal spirometry and lung volumes today but with a moderately reduced carbon monoxide diffusion capacity. There is no evidence of desaturation during her 6 minute walk test today. 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 She reports her dyspnea is unchanged. She reports a chronic cough that is productive of a clear mucus. Denies any wheezing. 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 No dry eyes, dry mouth, or oral ulcers.   Bibasilar Opacities: Resolved on high-resolution imaging. Did uncover interstitial lung disease.  GERD: Prescribed Protonix. No reflux, dyspepsia, morning brash water taste or dysphagia.   Chronic Seasonal Allergic Rhinitis: No obvious antigens on serum workup. She continues to have sinus congestion & drainage that is unchanged. She is currently on no medication.  Review of Systems No fever, chills, or sweats. No chest pain, pressure or tightness. No rashes or abnormal bruising.   Allergies  Allergen Reactions  . Aspirin     Bloody noses  . Demerol [Meperidine]     vomiting    Current Outpatient Prescriptions on File Prior to Visit  Medication Sig Dispense Refill  . atenolol (TENORMIN) 50 MG tablet Take 50 mg by mouth daily.    . Coenzyme Q10 (COQ10) 200 MG CAPS Take 1 tablet by mouth daily.     Marland Kitchen docusate sodium (COLACE) 100 MG capsule Take 1 capsule (100 mg total) by mouth every 12 (twelve) hours. (Patient taking differently: Take 100 mg by mouth 2 (two) times daily as needed for mild constipation. ) 60 capsule 0  . flecainide (TAMBOCOR) 50 MG tablet Take 1 tablet (50 mg total) by mouth every 12 (twelve) hours. 60 tablet 0  . hydrochlorothiazide (HYDRODIURIL) 25 MG tablet Take 25 mg by mouth  daily.    . pantoprazole (PROTONIX) 40 MG tablet Take 40 mg by mouth daily.    . potassium chloride SA (K-DUR,KLOR-CON) 20 MEQ tablet Take 2 tablets (40 mEq total) by mouth daily. 20 tablet 0  . rivaroxaban (XARELTO) 20 MG TABS tablet Take 1 tablet (20 mg total) by mouth daily with supper. 30 tablet 0  . UNABLE TO FIND Take 1 tablet by mouth daily. Calcium 600 mg with Vitamin D3     No current facility-administered medications on file prior to visit.     Past Medical History:  Diagnosis Date  . Allergic rhinitis   . Breast cancer ([place])       . Complication of anesthesia    " TAKES ALONG TIME TO WAKE ME "  . GERD (gastroesophageal reflux disease)   . History of kidney infection   . Hypertension   . Persistent atrial fibrillation ([place])    a. s/p PVI 2009 in [place]  . Spinal stenosis     Past Surgical History:  Procedure Laterality Date  . ABLATION  2009   [place]I [place]  . APPENDECTOMY    . BREAST SURGERY     partial mastectomy  . CARDIOVERSION N/A 11/18/2014   Procedure: CARDIOVERSION;  Surgeon: Adrian Prows, MD;  Location: [facility];  Service: Cardiovascular;  Laterality: N/A;  . carpel tunnel    . CESAREAN SECTION    . GANGLION CYST EXCISION    . HEMORRHOID SURGERY    .  KYPHOPLASTY  2017  . TONSILLECTOMY      Family History  Problem Relation Age of Onset  . Lung cancer Sister   . Heart failure Mother   . Brain cancer Father   . Lung disease Neg Hx   . Rheumatologic disease Neg Hx     Social History   Social History  . Marital status: Widowed    Spouse name: N/A  . Number of children: N/A  . Years of education: N/A   Social History Main Topics  . Smoking status: Passive Smoke Exposure - Never Smoker  . Smokeless tobacco: Never Used     Comment: exposed to second-hand smoke her entire life  . Alcohol use Yes     Comment: weekley beer  . Drug use: No  . Sexual activity: Not Asked   Other Topics Concern  . None   Social History Narrative   Live with daughter.  Retired.       [facility]:   Originally from [place]. Moved to [place] in 2015. Living with daughter & son-in-law. Remote travel to [place]. 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 No pets currently. Remote parakeet exposure. No mold exposure. Previously enjoyed square dancing and now plays on her computer. Also previously enjoyed camping in her camper but has since sold it.       Objective:   Physical Exam BP 122/60 (BP Location: Right Arm, Patient Position: Sitting, Cuff Size: Normal)   Pulse 73   Ht 5' 4" (1.626 m)   Wt 154 lb 14.4 oz (70.3 kg) Comment: per pt  SpO2 100%   BMI 26.59 kg/m   General:  Awake. Alert. No distress.  Integument:  Warm & dry. No rash on exposed skin.  Extremities:  No cyanosis or clubbing.  HEENT:  Moist mucus membranes. No oral ulcers. No scleral injection or icterus. Mild bilateral nasal turbinate swelling. Cardiovascular:  Regular rate. No edema. Normal S1 & S2. Pulmonary:  Basilar Velcro crackles. No accessory muscle use. Normal work of breathing on room air. Abdomen: Soft. Normal bowel sounds. Nondistended. Musculoskeletal:  Normal bulk and tone. Hand grip strength 5/5 bilaterally. No joint deformity or effusion appreciated. Minimal synovial thickening of PIP joints bilaterally.  PFT 05/17/16: FVC 3.10 L (117%) FEV1 2.53 L (128%) FEV1/FVC 0.81 FEF 25-75 2.68 L (176%) negative bronchodilator response TLC 4.39 L (89%) RV 60% ERV 147% DLCO corrected 55%  6MWT 05/17/16:  Walked 144 meters / Baseline Sat 99% on RA / Nadir Sat 99% on RA (stopped w/ 3:14 left due to dyspnea, knee pain, & hip pain)  IMAGING HRCT CHEST W/O 03/28/16 (personally reviewed by me): Minimal cardiomegaly. No pathologic mediastinal adenopathy. No pleural effusion but there is a tiny calcified pleural plaque on the right. There is a 4 mm right middle lobe nodule, 6 mm in lingula nodule, & 5 mm left lower  lobe nodule. Mild traction bronchiectasis is present. Questionable early honeycombing is present in the sulci bilaterally. 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 No groundglass opacities are appreciated. Mild air trapping is present with some mosaic attenuation.  CTA ABDOMEN/PELVIS 03/09/15 (previously reviewed by me): Lung windows reviewed. Intralobular septal thickening and patchy opacification in bilateral lung bases. Suggestion of pleural thickening but no pleural effusion.  CXR PA/LAT 11/16/14 (previously reviewed by me): Questionable left basilar opacity. No pleural effusion. Heart normal in size &  mediastinum normal in contour. No other parenchymal mass or opacification appreciated.  LABS 03/22/16 CBC: 9.2/10.9/34.0/304 Eosinophil: 0.1 IgE: 10 RAST panel: Negative    Assessment & Plan:  76 y.o. female with shortness of breath, bibasilar opacities, GERD, and chronic seasonal allergic rhinitis. Patient's shortness of breath is quite probably due to a combination of not only physical deconditioning but also her underlying interstitial lung disease. With her history of asbestos exposure and some/minimal pleural plaque formation I do question whether or not she could have asbestosis. 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 With her history of Raynaud's phenomenon I do question whether or not scleroderma could be a causative diagnosis for her interstitial lung disease. We discussed the need for further serum workup and holding off on surgical lung biopsy at this time. Additionally, I'm holding off on immunosuppression given the potential that this could also represent idiopathic pulmonary fibrosis. Her reflux seems to be well-controlled at this time. I instructed the patient contact my office if she had any new breathing problems or questions before next appointment.  1. ILD:  Likely secondary to asbestos exposure. Repeat spirometry with DLCO and 6 minute walk test at next appointment. Checking serum ESR, CRP, ENA, rheumatoid factor, anti-CCP, SSA, SSB, SCL 70, centromere antibody screen, hypersensitivity pneumonitis panel, double strength DNA antibody, Smith antibody, and myositis antibody panel. 2. GERD: Asymptomatic on Protonix. No changes at this time.  3. Chronic Seasonal Allergic Rhinitis:  Recommended considering allergist referral for immunotherapy. 4. Health Maintenance:  S/P Influenza Vaccine September 2017 & Prevnar in 2015. Previously had Pneumovax.  5. Follow-up:  Return to clinic 3 months or sooner if needed.  Sonia Baller Ashok Cordia, M.D. [facility] Pager:  [phone] After 3pm or if no response, call [phone] 4:12 PM 05/17/16

## 2016-05-17 NOTE — Progress Notes (Deleted)
Subjective:    Patient ID: Karina Tran, female    DOB: May 12, 1940, 76 y.o.   MRN: [id number]  HPI She reports she starting having breathing problems over the last 2 years. She reports it is progressively worsening. She has noticed dyspnea only with exertion. Does wheeze at times. She reports she has had a cough since 76 y.o. She was previously told it was secondary to the Atenolol. The cough doesn't seem to be worsening. She reports she coughs primarily in the morning producing a mucus that she swallows. She does cough intermittently throughout the day. No nocturnal awakenings with any coughing, wheezing, or dyspnea. No chest tightness, pain, or pressure. No fever, chills, or sweats. Denies any reflux, dyspepsia or morning brash water taste on Protonix. She reports she has noticed sinus congestion with drainage and "runny" eyes since she moved to [place]. She reports she has had bronchitis in the past. No history of asthma or allergies as a child or young adult.   Review of Systems No rashes or bruising. She does have chronic pain in her knees and hips. She does report some swelling and inflammation in her knees. No stiffness in her hands. A pertinent 14 point review of systems is negative except as per the history of presenting illness.  Allergies  Allergen Reactions  . Aspirin     Bloody noses  . Demerol [Meperidine]     vomiting    Current Outpatient Prescriptions on File Prior to Visit  Medication Sig Dispense Refill  . atenolol (TENORMIN) 50 MG tablet Take 50 mg by mouth daily.    . Coenzyme Q10 (COQ10) 200 MG CAPS Take 1 tablet by mouth daily.     Marland Kitchen docusate sodium (COLACE) 100 MG capsule Take 1 capsule (100 mg total) by mouth every 12 (twelve) hours. (Patient taking differently: Take 100 mg by mouth 2 (two) times daily as needed for mild constipation. ) 60 capsule 0  . flecainide (TAMBOCOR) 50 MG tablet Take 1 tablet (50 mg total) by mouth every 12 (twelve) hours. 60 tablet 0  .  hydrochlorothiazide (HYDRODIURIL) 25 MG tablet Take 25 mg by mouth daily.    . pantoprazole (PROTONIX) 40 MG tablet Take 40 mg by mouth daily.    . potassium chloride SA (K-DUR,KLOR-CON) 20 MEQ tablet Take 2 tablets (40 mEq total) by mouth daily. (Patient not taking: Reported on 03/22/2016) 20 tablet 0  . rivaroxaban (XARELTO) 20 MG TABS tablet Take 1 tablet (20 mg total) by mouth daily with supper. 30 tablet 0  . UNABLE TO FIND Take 1 tablet by mouth daily. Calcium 600 mg with Vitamin D3     No current facility-administered medications on file prior to visit.     Past Medical History:  Diagnosis Date  . Allergic rhinitis   . Breast cancer ([place])       . Complication of anesthesia    " TAKES ALONG TIME TO WAKE ME "  . GERD (gastroesophageal reflux disease)   . History of kidney infection   . Hypertension   . Persistent atrial fibrillation ([place])    a. s/p PVI 2009 in [place]  . Spinal stenosis     Past Surgical History:  Procedure Laterality Date  . ABLATION  2009   PVI in [place]  . APPENDECTOMY    . BREAST SURGERY     partial mastectomy  . CARDIOVERSION N/A 11/18/2014   Procedure: CARDIOVERSION;  Surgeon: Adrian Prows, MD;  Location: [facility];  Service: Cardiovascular;  Laterality: N/A;  . carpel tunnel    . CESAREAN SECTION    . GANGLION CYST EXCISION    . HEMORRHOID SURGERY    . KYPHOPLASTY  2017  . TONSILLECTOMY      Family History  Problem Relation Age of Onset  . Lung cancer Sister   . Heart failure Mother   . Brain cancer Father   . Lung disease Neg Hx   . Rheumatologic disease Neg Hx     Social History   Social History  . Marital status: Widowed    Spouse name: N/A  . Number of children: N/A  . Years of education: N/A   Social History Main Topics  . Smoking status: Passive Smoke Exposure - Never Smoker  . Smokeless tobacco: Never Used     Comment: exposed to second-hand smoke her entire life  . Alcohol use Yes     Comment: weekley beer  . Drug use: No  .  Sexual activity: Not on file   Other Topics Concern  . Not on file   Social History Narrative   Live with daughter. Retired.       [facility]:   Originally from [place]. Moved to [place] in 2015. Living with daughter & son-in-law. Remote travel to [place]. 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 No pets currently. Remote parakeet exposure. No mold exposure. Previously enjoyed square dancing and now plays on her computer. Also previously enjoyed camping in her camper but has since sold it.       Objective:   Physical Exam There were no vitals taken for this visit. General:  Awake. Alert. No acute distress.   Integument:  Warm & dry. No rash on exposed skin. No bruising on exposed skin. Lymphatics:  No appreciated cervical or supraclavicular lymphadenoapthy. HEENT:  Moist mucus membranes. No oral ulcers. No scleral injection or icterus. Mild bilateral nasal turbinate swelling. Cardiovascular:  Regular rate with irregular rhythm. No edema. Pulmonary:   Bilateral basilar Velcro crackles. Symmetric chest wall expansion. No accessory muscle useon room air . Abdomen: Soft. Normal bowel sounds. Nondistended.  Musculoskeletal:  Normal bulk and tone. Hand grip strength 5/5 bilaterally. No joint deformity or effusion appreciated. Neurological:  CN 2-12 grossly in tact. No meningismus. Moving all 4 extremities equally. Symmetric brachioradialis deep tendon reflexes. Psychiatric:  Mood and affect congruent. Speech normal rhythm, rate & tone.   IMAGING CTA ABDOMEN/PELVIS 03/09/15 (personally reviewed by me): Lung windows reviewed. Intralobular septal thickening and patchy opacification in bilateral lung bases. Suggestion of pleural thickening but no pleural effusion.  CXR PA/LAT 11/16/14 (personally reviewed by me): Questionable left basilar opacity. No pleural effusion. Heart normal in size & mediastinum normal in  contour. No other parenchymal mass or opacification appreciated.    Assessment & Plan:  76 y.o. female with Progressively worsening shortness of breath as well as evidence of basilar lung opacities on previous abdominal CT imaging. Patient's crackles on physical exam could indicate underlying interstitial lung disease and given her previous exposure to asbestos this is certainly possible. Her reflux seems to be very well-controlled at this time. Her symptoms overall could indicate underlying COPD or even emphysema with her long-standing history of secondhand smoke exposure. We will need to perform further chest imaging as well as pulmonary function testing to determine the exact cause of her symptoms. Her physical exam does not suggest an autoimmune etiology to her arthritis but depending upon her chest CT imaging further  blood work may be necessary and I did communicate this to the patient.   1. Shortness of Breath: Checking 6 minute walk test on room air and full pulmonary function testing before next appointment. 2. GERD: Well-controlled with Protonix. No changes.  3. Basilar Opacities: Checking high-resolution CT chest without contrast before next appointment.  4. Chronic Seasonal Allergic Rhinitis: Checking serum CBC with differential and RAST panel today.  5. Health Maintenance:  S/P Influenza Vaccine September 2017 & Prevnar in 2015. Previously had Pneumovax.  6. Follow-up:  Return to clinic  6 weeks or sooner if needed.   Sonia Baller Ashok Cordia, M.D. [facility] Pager:  [phone] After 3pm or if no response, call [phone] 3:17 PM 05/17/16

## 2016-05-17 NOTE — Progress Notes (Signed)
Test reviewed.  

## 2016-05-17 NOTE — Telephone Encounter (Signed)
IMAGING HRCT CHEST W/O 03/28/16 (personally reviewed by me): Minimal cardiomegaly. No pathologic mediastinal adenopathy. No pleural effusion but there is a tiny calcified pleural plaque on the right. There is a 4 mm right middle lobe nodule, 6 mm in lingula nodule, & 5 mm left lower lobe nodule. Mild traction bronchiectasis is present. Questionable early honeycombing is present in the sulci bilaterally. 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 No groundglass opacities are appreciated. Mild air trapping is present with some mosaic attenuation.  LABS 03/22/16 CBC: 9.2/10.9/34.0/304 Eosinophil: 0.1 IgE: 10 RAST panel: Negative

## 2016-05-18 ENCOUNTER — Other Ambulatory Visit (INDEPENDENT_AMBULATORY_CARE_PROVIDER_SITE_OTHER): Payer: Medicare Other

## 2016-05-18 DIAGNOSIS — J849 Interstitial pulmonary disease, unspecified: Secondary | ICD-10-CM | POA: Diagnosis not present

## 2016-05-18 LAB — SEDIMENTATION RATE: Sed Rate: 25 mm/hr (ref 0–30)

## 2016-05-18 LAB — C-REACTIVE PROTEIN: CRP: 0.1 mg/dL — AB (ref 0.5–20.0)

## 2016-05-18 NOTE — Progress Notes (Signed)
Test reviewed.  

## 2016-05-19 LAB — SJOGREN'S SYNDROME ANTIBODS(SSA + SSB)
SSA (Ro) (ENA) Antibody, IgG: <1
SSB (La) (ENA) Antibody, IgG: <1

## 2016-05-19 LAB — ANTI-SMITH ANTIBODY: ENA SM AB SER-ACNC: NEGATIVE

## 2016-05-19 LAB — ANTI-SCLERODERMA ANTIBODY
SCLERODERMA (SCL-70) (ENA) ANTIBODY, IGG: NEGATIVE
Scleroderma (Scl-70) (ENA) Antibody, IgG: <1

## 2016-05-19 LAB — CYCLIC CITRUL PEPTIDE ANTIBODY, IGG

## 2016-05-19 LAB — RHEUMATOID FACTOR: Rhuematoid fact SerPl-aCnc: <14 IU/mL (ref <14)

## 2016-05-19 LAB — CENTROMERE ANTIBODIES: Centromere Ab Screen: <1

## 2016-05-29 LAB — MYOSITIS PANEL III
EJ*: NEGATIVE
JO-1 (WB): NEGATIVE
Ku*: NEGATIVE
MI-2 ANTIBODIES: NEGATIVE
OJ*: NEGATIVE
PL-12*: NEGATIVE
PL-7*: NEGATIVE
PM-SCL 75: NEGATIVE
PM-Scl 100*: POSITIVE — AB
RNP: 16.8 U/mL
RO-52: NEGATIVE
Signal Recognition Particle*: NEGATIVE

## 2016-05-29 LAB — ANA+ENA+DNA/DS+ANTICH+CENTR
ANA Titer 1: NEGATIVE
ENA RNP AB: 0.4 AI (ref 0.0–0.9)
ENA SM Ab Ser-aCnc: <0.2 AI (ref 0.0–0.9)
ENA SSA (RO) Ab: <0.2 AI (ref 0.0–0.9)
Scleroderma SCL-70: <0.2 AI (ref 0.0–0.9)
dsDNA Ab: <1 IU/mL (ref 0–9)

## 2016-06-22 ENCOUNTER — Ambulatory Visit
Admission: RE | Admit: 2016-06-22 | Discharge: 2016-06-22 | Disposition: A | Discharge status: Home / Self Care | Payer: Medicare Other | Source: Ambulatory Visit | Attending: Family Medicine | Admitting: Family Medicine

## 2016-06-22 DIAGNOSIS — Z1231 Encounter for screening mammogram for malignant neoplasm of breast: Secondary | ICD-10-CM

## 2016-06-28 NOTE — Progress Notes (Deleted)
CLINIC:  Survivorship   REASON FOR VISIT:  Routine follow-up for history of breast cancer.   BRIEF ONCOLOGIC HISTORY:    Breast cancer of upper-outer quadrant of left female breast ([place])   03/24/2005 Surgery    Left breast lumpectomy 1.5cm tumor, grade 2, 2 sentinel nodes negative, ER 83% PR 0% HER-2 negative ratio 0.7, Ki-67 0% T1c N0 M0 stage IA      04/26/2005 - 06/02/2005 Radiation Therapy    Adjuvant radiation therapy      06/26/2005 - 06/24/2010 Anti-estrogen oral therapy    Adjuvant antiestrogen therapy with Arimidex 1 mg daily 5 years        INTERVAL HISTORY:  Ms. Pistole presents to the [facility] today for routine follow-up for her history of breast cancer.  Overall, she reports feeling quite well. ***    REVIEW OF SYSTEMS:  ***     Breast: Denies any new nodularity, masses, tenderness, nipple changes, or nipple discharge.    A 14-point review of systems was completed and was negative, except as noted above.    PAST MEDICAL/SURGICAL HISTORY:  Past Medical History:  Diagnosis Date  . Allergic rhinitis   . Breast cancer ([place])       . Complication of anesthesia    " TAKES ALONG TIME TO WAKE ME "  . GERD (gastroesophageal reflux disease)   . History of kidney infection   . Hypertension   . Persistent atrial fibrillation ([place])    a. s/p PVI 2009 in [place]  . Spinal stenosis    Past Surgical History:  Procedure Laterality Date  . ABLATION  2009   [place]I [place]  . APPENDECTOMY    . BREAST LUMPECTOMY Left 2007  . BREAST SURGERY     partial mastectomy  . CARDIOVERSION N/A 11/18/2014   Procedure: CARDIOVERSION;  Surgeon: Adrian Prows, MD;  Location: [facility];  Service: Cardiovascular;  Laterality: N/A;  . carpel tunnel    . CESAREAN SECTION    . GANGLION CYST EXCISION    . HEMORRHOID SURGERY    . KYPHOPLASTY  2017  . TONSILLECTOMY       ALLERGIES:  Allergies  Allergen Reactions  . Aspirin     Bloody noses  . Demerol [Meperidine]    vomiting     CURRENT MEDICATIONS:  Outpatient Encounter Prescriptions as of 06/29/2016  Medication Sig  . amLODipine (NORVASC) 2.5 MG tablet Take 1 tablet by mouth daily.  Marland Kitchen atenolol (TENORMIN) 50 MG tablet Take 50 mg by mouth daily.  . Coenzyme Q10 (COQ10) 200 MG CAPS Take 1 tablet by mouth daily.   Marland Kitchen docusate sodium (COLACE) 100 MG capsule Take 1 capsule (100 mg total) by mouth every 12 (twelve) hours. (Patient taking differently: Take 100 mg by mouth 2 (two) times daily as needed for mild constipation. )  . flecainide (TAMBOCOR) 50 MG tablet Take 1 tablet (50 mg total) by mouth every 12 (twelve) hours.  . hydrochlorothiazide (HYDRODIURIL) 25 MG tablet Take 25 mg by mouth daily.  . pantoprazole (PROTONIX) 40 MG tablet Take 40 mg by mouth daily.  . potassium chloride SA (K-DUR,KLOR-CON) 20 MEQ tablet Take 2 tablets (40 mEq total) by mouth daily.  . rivaroxaban (XARELTO) 20 MG TABS tablet Take 1 tablet (20 mg total) by mouth daily with supper.  Marland Kitchen UNABLE TO FIND Take 1 tablet by mouth daily. Calcium 600 mg with Vitamin D3  . vitamin E 400 UNIT capsule Take 1 capsule by mouth daily.  No facility-administered encounter medications on file as of 06/29/2016.      ONCOLOGIC FAMILY HISTORY:  Family History  Problem Relation Age of Onset  . Lung cancer Sister   . Heart failure Mother   . Brain cancer Father   . Lung disease Neg Hx   . Rheumatologic disease Neg Hx     GENETIC COUNSELING/TESTING: ***  SOCIAL HISTORY:  Ceara Wrightson is /single/married/divorced/widowed/separated and lives alone/with her spouse/family/friend in (city), [place].  She has (#) children and they live in (city).  Ms. Salvador is currently retired/disabled/working part-time/full-time as ***.  She denies any current or history of tobacco, alcohol, or illicit drug use.     PHYSICAL EXAMINATION:  Vital Signs: There were no vitals filed for this visit. There were no vitals filed for this  visit. General: Well-nourished, well-appearing female in no acute distress.  Unaccompanied/Accompanied by***** today.   HEENT: Head is normocephalic.  Pupils equal and reactive to light. Conjunctivae clear without exudate.  Sclerae anicteric. Oral mucosa is pink, moist.  Oropharynx is pink without lesions or erythema.  Lymph: No cervical, supraclavicular, or infraclavicular lymphadenopathy noted on palpation.  Cardiovascular: Regular rate and rhythm.Marland Kitchen Respiratory: Clear to auscultation bilaterally. Chest expansion symmetric; breathing non-labored.  Breast Exam:  -Left breast: No appreciable masses on palpation. No skin redness, thickening, or peau d'orange appearance; no nipple retraction or nipple discharge; mild distortion in symmetry at previous lumpectomy site***healed scar without erythema or nodularity.  -Right breast: No appreciable masses on palpation. No skin redness, thickening, or peau d'orange appearance; no nipple retraction or nipple discharge; mild distortion in symmetry at previous lumpectomy site***healed scar without erythema or nodularity. -Axilla: No axillary adenopathy bilaterally.  GI: Abdomen soft and round; non-tender, non-distended. Bowel sounds normoactive. No hepatosplenomegaly.   GU: Deferred.  Neuro: No focal deficits. Steady gait.  Psych: Mood and affect normal and appropriate for situation.  Extremities: No edema. Skin: Warm and dry.  LABORATORY DATA:  None for this visit***   DIAGNOSTIC IMAGING:  Most recent mammogram: ***    ASSESSMENT AND PLAN:  Ms.. Goonan is a pleasant 76 y.o. female with history of Stage *** right/left breast invasive ductal carcinoma, ER+/PR+/HER2-, diagnosed in (date), treated with lumpectomy, adjuvant radiation therapy, and anti-estrogen therapy with *** beginning in (date).  [facility] for [facility] and routine follow-up.   1. History of breast cancer:  Ms. Heringer is currently clinically and  radiographically without evidence of disease or recurrence of breast cancer. She will be due for mammogram in ***; orders placed today.  She will continue her anti-estrogen therapy with ***, with plans to continue for *** years.  She will return to the cancer center to see her medical oncologist, Dr. ***, in ***/2018.  I encouraged her to call me with any questions or concerns before her next visit at the cancer center, and I would be happy to see her sooner, if needed.    #. Problem(s) at Visit___________________.  #. Bone health:  Given Ms. Beaupre's age, history of breast cancer, and her current anti-estrogen therapy with ________, she is at risk for bone demineralization. Her last DEXA scan was on **/**/20**.  In the meantime, she was encouraged to increase her consumption of foods rich in calcium, as well as increase her weight-bearing activities.  She was given education on specific food and activities to promote bone health.  #. Cancer screening:  Due to Ms. Petrow's history and her age, she should receive screening  for skin cancers, colon cancer, and ***gynecologic cancers. She was encouraged to follow-up with her PCP for appropriate cancer screenings.   #. Health maintenance and wellness promotion: Ms. Deprey was encouraged to consume 5-7 servings of fruits and vegetables per day. She was also encouraged to engage in moderate to vigorous exercise for 30 minutes per day most days of the week. She was instructed to limit her alcohol consumption and continue to abstain from tobacco use/was encouraged stop smoking.  ***    Dispo:  -Return to cancer center ***   A total of (#) minutes of face-to-face time was spent with this patient with greater than 50% of that time in counseling and care-coordination.   Charlestine Massed, NP [facility] [place] [phone]   Note: PRIMARY CARE PROVIDER Curly Rim, [place] [phone]

## 2016-06-29 ENCOUNTER — Encounter: Payer: Self-pay | Admitting: Adult Health

## 2016-08-22 ENCOUNTER — Ambulatory Visit: Payer: Self-pay

## 2016-08-22 ENCOUNTER — Ambulatory Visit: Payer: Self-pay | Admitting: Pulmonary Disease

## 2017-03-12 ENCOUNTER — Emergency Department (HOSPITAL_COMMUNITY)
Admission: EM | Admit: 2017-03-12 | Discharge: 2017-03-12 | Disposition: A | Discharge status: Home / Self Care | Payer: Medicare Other | Attending: Emergency Medicine | Admitting: Emergency Medicine

## 2017-03-12 DIAGNOSIS — I10 Essential (primary) hypertension: Secondary | ICD-10-CM | POA: Diagnosis not present

## 2017-03-12 DIAGNOSIS — Z79899 Other long term (current) drug therapy: Secondary | ICD-10-CM | POA: Diagnosis not present

## 2017-03-12 DIAGNOSIS — E876 Hypokalemia: Secondary | ICD-10-CM | POA: Insufficient documentation

## 2017-03-12 DIAGNOSIS — R040 Epistaxis: Secondary | ICD-10-CM | POA: Diagnosis present

## 2017-03-12 LAB — CBC WITH DIFFERENTIAL/PLATELET
Basophils Absolute: 0.1 10*3/uL (ref 0.0–0.1)
Basophils Relative: 0 %
EOS ABS: 0.2 10*3/uL (ref 0.0–0.7)
Eosinophils Relative: 2 %
HEMATOCRIT: 44 % (ref 36.0–46.0)
HEMOGLOBIN: 14.2 g/dL (ref 12.0–15.0)
LYMPHS PCT: 20 %
Lymphs Abs: 2.2 10*3/uL (ref 0.7–4.0)
MCH: 28 pg (ref 26.0–34.0)
MCHC: 32.3 g/dL (ref 30.0–36.0)
MCV: 86.6 fL (ref 78.0–100.0)
Monocytes Absolute: 0.8 10*3/uL (ref 0.1–1.0)
Monocytes Relative: 7 %
NEUTROS ABS: 7.9 10*3/uL — AB (ref 1.7–7.7)
NEUTROS PCT: 71 %
Platelets: 273 10*3/uL (ref 150–400)
RBC: 5.08 MIL/uL (ref 3.87–5.11)
RDW: 20.8 % — ABNORMAL HIGH (ref 11.5–15.5)
WBC: 11.2 10*3/uL — AB (ref 4.0–10.5)

## 2017-03-12 LAB — COMPREHENSIVE METABOLIC PANEL
ALBUMIN: 3.5 g/dL (ref 3.5–5.0)
ALT: 15 U/L (ref 14–54)
AST: 23 U/L (ref 15–41)
Alkaline Phosphatase: 84 U/L (ref 38–126)
Anion gap: 8 (ref 5–15)
BILIRUBIN TOTAL: 0.7 mg/dL (ref 0.3–1.2)
BUN: 22 mg/dL — AB (ref 6–20)
CHLORIDE: 100 mmol/L — AB (ref 101–111)
CO2: 29 mmol/L (ref 22–32)
CREATININE: 0.81 mg/dL (ref 0.44–1.00)
Calcium: 9.4 mg/dL (ref 8.9–10.3)
GFR calc Af Amer: >60 mL/min (ref >60)
GLUCOSE: 111 mg/dL — AB (ref 65–99)
POTASSIUM: 3.1 mmol/L — AB (ref 3.5–5.1)
Sodium: 137 mmol/L (ref 135–145)
TOTAL PROTEIN: 7.5 g/dL (ref 6.5–8.1)

## 2017-03-12 LAB — PROTIME-INR
INR: 1.17
PROTHROMBIN TIME: 14.8 s (ref 11.4–15.2)

## 2017-03-12 MED ORDER — AMOXICILLIN-POT CLAVULANATE 875-125 MG PO TABS: 1.0000 | ORAL_TABLET | Freq: Two times a day (BID) | ORAL | 0 refills | Status: AC

## 2017-03-12 MED ORDER — POTASSIUM CHLORIDE CRYS ER 20 MEQ PO TBCR
40.0000 meq | EXTENDED_RELEASE_TABLET | Freq: Once | ORAL | Status: AC
  Administered 2017-03-12: 40 meq via ORAL
  Filled 2017-03-12: qty 2

## 2017-03-12 MED ORDER — OXYMETAZOLINE HCL 0.05 % NA SOLN
1.0000 | Freq: Once | NASAL | Status: AC
  Administered 2017-03-12: 1 via NASAL
  Filled 2017-03-12: qty 15

## 2017-03-12 NOTE — ED Notes (Signed)
ED Provider at bedside. 

## 2017-03-12 NOTE — ED Triage Notes (Signed)
Pt arrived via [facility] with epistaxis starting at 1620 today. She was on the way to urgent care to get it taken care of, but it got worse and she called EMS. Patient is xarelto for a-fib. Patient has not had the blood thinner today. She has soaked 3 chuck pads with blood. Patient complaining of blood in throat and the bleeding has not gotten better over the last hour.

## 2017-03-12 NOTE — ED Notes (Signed)
Bed: GU54 Expected date:  Expected time:  Means of arrival:  Comments: Ems-NOSE BLEED-BLOOD THINNERS

## 2017-03-12 NOTE — ED Provider Notes (Signed)
[facility] DEPT Provider Note   CSN: [id number] Arrival date & time: 03/12/17  1722     History   Chief Complaint Chief Complaint  Patient presents with  . Epistaxis    HPI Karina Tran is a 76 y.o. female.  The history is provided by the patient and medical records.  Epistaxis   This is a new problem. The current episode started 1 to 2 hours ago. The problem occurs constantly. The problem has not changed since onset.The problem is associated with anticoagulants. The bleeding has been from the left nare. She has tried applying pressure for the symptoms. The treatment provided no relief. Her past medical history does not include bleeding disorder.    Past Medical History:  Diagnosis Date  . Allergic rhinitis   . Breast cancer ([facility] [facility])       . Complication of anesthesia    " TAKES ALONG TIME TO WAKE ME "  . GERD (gastroesophageal reflux disease)   . History of kidney infection   . Hypertension   . Persistent atrial fibrillation ([place])    a. s/p PVI 2009 in [place]  . Spinal stenosis     Patient Active Problem List   Diagnosis Date Noted  . ILD (interstitial lung disease) ([facility] [facility]) 05/17/2016  . Asbestos exposure 05/17/2016  . Raynaud phenomenon 05/17/2016  . Shortness of breath 03/22/2016  . GERD (gastroesophageal reflux disease) 03/22/2016  . Arthritis 03/22/2016  . Chronic seasonal allergic rhinitis 03/22/2016  . Pancreatitis 03/09/2015  . Essential hypertension   . Loss of weight   . Thoracic compression fracture ([place])   . Paroxysmal atrial fibrillation ([facility])   . Atrial flutter ([place]) 11/17/2014  . Breast cancer of upper-outer quadrant of left female breast ([facility]) 06/25/2014    Past Surgical History:  Procedure Laterality Date  . ABLATION  2009   PV[place] in [place]  . APPENDECTOMY    . BREAST LUMPECTOMY Left 2007  . BREAST SURGERY     partial mastectomy  . CARDIOVERSION N/A 11/18/2014   Procedure: CARDIOVERSION;  Surgeon: Adrian Prows, MD;   Location: [facility];  Service: Cardiovascular;  Laterality: N/A;  . carpel tunnel    . CESAREAN SECTION    . GANGLION CYST EXCISION    . HEMORRHOID SURGERY    . KYPHOPLASTY  2017  . TONSILLECTOMY      OB History    No data available       Home Medications    Prior to Admission medications   Medication Sig Start Date End Date Taking? Authorizing Provider  amLODipine (NORVASC) 2.5 MG tablet Take 1 tablet by mouth daily. 02/19/16   [provider]  atenolol (TENORMIN) 50 MG tablet Take 50 mg by mouth daily.    [provider]  Coenzyme Q10 (COQ10) 200 MG CAPS Take 1 tablet by mouth daily.     [provider]  docusate sodium (COLACE) 100 MG capsule Take 1 capsule (100 mg total) by mouth every 12 (twelve) hours. Patient taking differently: Take 100 mg by mouth 2 (two) times daily as needed for mild constipation.  02/04/15   Tanna Furry, MD  flecainide (TAMBOCOR) 50 MG tablet Take 1 tablet (50 mg total) by mouth every 12 (twelve) hours. 11/18/14   Neldon Labella, NP  hydrochlorothiazide (HYDRODIURIL) 25 MG tablet Take 25 mg by mouth daily.    [provider]  pantoprazole (PROTONIX) 40 MG tablet Take 40 mg by mouth daily.    [provider]  potassium chloride SA (K-DUR,KLOR-CON) 20 MEQ tablet Take 2 tablets (40 mEq total) by mouth daily. 03/11/15   [name], DO  rivaroxaban (XARELTO) 20 MG TABS tablet Take 1 tablet (20 mg total) by mouth daily with supper. 11/18/14   Neldon Labella, NP  UNABLE TO FIND Take 1 tablet by mouth daily. Calcium 600 mg with Vitamin D3    [provider]  vitamin E 400 UNIT capsule Take 1 capsule by mouth daily.    [provider]    Family History Family History  Problem Relation Age of Onset  . Lung cancer Sister   . Heart failure Mother   . Brain cancer Father   . Lung disease Neg Hx   . Rheumatologic disease Neg Hx     Social History Social History   Tobacco  Use  . Smoking status: Passive Smoke Exposure - Never Smoker  . Smokeless tobacco: Never Used  . Tobacco comment: exposed to second-hand smoke her entire life  Substance Use Topics  . Alcohol use: Yes    Comment: weekley beer  . Drug use: No     Allergies   Aspirin and Demerol [meperidine]   Review of Systems Review of Systems  Constitutional: Negative for chills, diaphoresis, fatigue and fever.  HENT: Positive for nosebleeds. Negative for congestion.   Eyes: Negative for visual disturbance.  Respiratory: Negative for cough, chest tightness and shortness of breath.   Cardiovascular: Negative for chest pain.  Gastrointestinal: Negative for abdominal pain.  Genitourinary: Negative for dysuria.  Musculoskeletal: Negative for back pain.  Neurological: Negative for light-headedness and headaches.  All other systems reviewed and are negative.    Physical Exam Updated Vital Signs There were no vitals taken for this visit.  Physical Exam  Constitutional: She is oriented to person, place, and time. She appears well-developed and well-nourished. No distress.  HENT:  Nose: No nasal septal hematoma. Epistaxis is observed.  No foreign bodies.    Mouth/Throat: No oropharyngeal exudate.  Eyes: Conjunctivae are normal. Pupils are equal, round, and reactive to light.  Neck: Normal range of motion.  Cardiovascular: Intact distal pulses.  No murmur heard. Pulmonary/Chest: Effort normal. No respiratory distress. She has no wheezes. She exhibits no tenderness.  Abdominal: There is no tenderness.  Musculoskeletal: She exhibits no tenderness.  Neurological: She is alert and oriented to person, place, and time.  Skin: Capillary refill takes less than 2 seconds. No rash noted. She is not diaphoretic. No erythema.  Nursing note and vitals reviewed.    ED Treatments / Results  Labs (all labs ordered are listed, but only abnormal results are displayed) Labs Reviewed  CBC WITH  DIFFERENTIAL/PLATELET - Abnormal; Notable for the following components:      Result Value   WBC 11.2 (*)    RDW 20.8 (*)    Neutro Abs 7.9 (*)    All other components within normal limits  COMPREHENSIVE METABOLIC PANEL - Abnormal; Notable for the following components:   Potassium 3.1 (*)    Chloride 100 (*)    Glucose, Bld 111 (*)    BUN 22 (*)    All other components within normal limits  PROTIME-INR    EKG  EKG Interpretation None       Radiology No results found.  Procedures .Epistaxis Management Date/Time: 03/12/2017 11:31 PM Performed by: Courtney Paris, MD Authorized by: Courtney Paris, MD   Consent:    Consent obtained:  Verbal   Consent given by:  Patient   Risks discussed:  Infection and bleeding Anesthesia (see MAR for exact dosages):    Anesthesia method:  None Procedure details:    Treatment site:  L posterior   Treatment method:  Nasal tampon   Treatment complexity:  Limited   Treatment episode: initial   Post-procedure details:    Assessment:  Bleeding stopped   Patient tolerance of procedure:  Tolerated well, no immediate complications    (including critical care time)  Medications Ordered in ED Medications  oxymetazoline (AFRIN) 0.05 % nasal spray 1 spray (1 spray Each Nare Given 03/12/17 1740)  potassium chloride SA (K-DUR,KLOR-CON) CR tablet 40 mEq (40 mEq Oral Given 03/12/17 2113)     Initial Impression / Assessment and Plan / ED Course  I have reviewed the triage vital signs and the nursing notes.  Pertinent labs & imaging results that were available during my care of the patient were reviewed by me and considered in my medical decision making (see chart for details).     Rhythm Gubbels is a 76 y.o. female with a past medical history significant for atrial fibrillation/flutter on Xarelto, Raynaud's, GERD, and prior breast cancer who presents with epistaxis.  Patient reports that for the last hour and a half she has  had severe bleeding from her left nostril and out of her mouth.  She reports no recent nose picking or nose blowing.  She denies any head trauma or nasal trauma.  She denies history of severe nosebleeds.  She does report a history of anemia with multiple blood transfusions in the past.  She denies any palpitations, shortness breath, chest pain, or lightheadedness at this time.  She reports that she has soaked through many towels with her nose bleeding.  On arrival, patient is holding pressure with a towel and is bleeding out of her left ear primarily.  Patient does have bleeding the back of her throat.  Patient has no evidence of trauma on exam.  Patient initially had pressure held with Afrin nose spray without success.  Patient continued to have bleeding out of her left nare.  Patient's lungs were clear and chest and abdomen were nontender.   Patient had Afrin re-sprayed after nose blowing and suction.  Numerous clots were pulled out.  Patient did not have evidence of an anterior bleed on my initial inspection.  Due to the continued bleeding, a rapid Rhino packing was placed in the left nare.  The patient will be observed to see if bleeding persists.    Due to patient's history of anemia and the amount of blood loss, patient will have laboratory tests to look for worsened anemia.    Anticipate reassessment after lab testing.       Patient's hemoglobin was normal.  Patient had mild leukocytosis.  Patient had hypokalemia which she has had in the past.  Oral potassium supplementation was provided.    Patient's epistaxis stopped after rapid Rhino was placed.  Given epistaxis stopping, patient was felt stable for discharge home.  Patient given prescription for Augmentin to prevent toxic shock syndrome.  Patient will follow up with a laryngology for packing removal.  Patient understood return precautions.  Patient had no other questions or concerns and was discharged in good condition.   Final Clinical  Impressions(s) / ED Diagnoses   Final diagnoses:  Epistaxis  Hypokalemia    ED Discharge Orders        Ordered    amoxicillin-clavulanate (AUGMENTIN) 875-125 MG tablet  2 times  daily     03/12/17 2052      Clinical Impression: 1. Epistaxis   2. Hypokalemia     Disposition: Discharge  Condition: Good  I have discussed the results, Dx and Tx plan with the pt(& family if present). He/she/they expressed understanding and agree(s) with the plan. Discharge instructions discussed at great length. Strict return precautions discussed and pt &/or family have verbalized understanding of the instructions. No further questions at time of discharge.    This SmartLink is deprecated. Use AVSMEDLIST instead to display the medication list for a patient.  Follow Up: Irene [address], [address] [place] [address] [phone]  In 2 days      Tegeler, Gwenyth Allegra, MD 03/12/17 [phone]

## 2017-03-12 NOTE — Discharge Instructions (Signed)
Please follow-up with ENT in the next 2-3 days for reevaluation and removal of your nasal packing.  Please take the antibiotics to prevent toxic shock.  Please follow-up with your PCP for reassessment of her low potassium.  If any symptoms change or worsen, please return to the nearest emergency department.

## 2017-05-15 ENCOUNTER — Other Ambulatory Visit: Payer: Self-pay | Admitting: Family Medicine

## 2017-05-15 DIAGNOSIS — Z1231 Encounter for screening mammogram for malignant neoplasm of breast: Secondary | ICD-10-CM

## 2017-06-21 ENCOUNTER — Encounter (HOSPITAL_COMMUNITY): Payer: Self-pay

## 2017-06-21 ENCOUNTER — Other Ambulatory Visit (HOSPITAL_COMMUNITY): Payer: Self-pay | Admitting: Emergency Medicine

## 2017-06-21 NOTE — Progress Notes (Signed)
LOV/cardiology clearance Dr. Kela Millin 06-18-17 on chart   EKG 06-18-17 onc hart from [facility]   ECHO 01-13-16 (see LOV with Dr. Kela Millin 06-18-17 on chart )  Stress test 06-29-14 (see LOV with Dr. Kela Millin 06-18-17 on chart )

## 2017-06-21 NOTE — Patient Instructions (Addendum)
Karina Tran  06/21/2017   Your procedure is scheduled on: 07-03-17  Report to [facility] Main  Entrance    Report to admitting at 6:00AM    Call this number if you have problems the morning of surgery [phone]     Remember: Do not eat or drink after midnight .      Take these medicines the morning of surgery with A SIP OF WATER: amlodipine, atenolol, flecainide, gabapentin, pantoprazole                                You may not have any metal on your body including hair pins and              piercings  Do not wear jewelry, make-up, lotions, powders or perfumes, deodorant             Do not wear nail polish.  Do not shave  48 hours prior to surgery.       Do not bring valuables to the hospital. [place].  Contacts, dentures or bridgework may not be worn into surgery.  Leave suitcase in the car. After surgery it may be brought to your room.                 Please read over the following fact sheets you were given: _____________________________________________________________________             [facility] - Preparing for Surgery Before surgery, you can play an important role.  Because skin is not sterile, your skin needs to be as free of germs as possible.  You can reduce the number of germs on your skin by washing with CHG (chlorahexidine gluconate) soap before surgery.  CHG is an antiseptic cleaner which kills germs and bonds with the skin to continue killing germs even after washing. Please DO NOT use if you have an allergy to CHG or antibacterial soaps.  If your skin becomes reddened/irritated stop using the CHG and inform your nurse when you arrive at Short Stay. Do not shave (including legs and underarms) for at least 48 hours prior to the first CHG shower.  You may shave your face/neck. Please follow these instructions carefully:  1.  Shower with CHG Soap the night before surgery and  the  morning of Surgery.  2.  If you choose to wash your hair, wash your hair first as usual with your  normal  shampoo.  3.  After you shampoo, rinse your hair and body thoroughly to remove the  shampoo.                           4.  Use CHG as you would any other liquid soap.  You can apply chg directly  to the skin and wash                       Gently with a scrungie or clean washcloth.  5.  Apply the CHG Soap to your body ONLY FROM THE NECK DOWN.   Do not use on face/ open  Wound or open sores. Avoid contact with eyes, ears mouth and genitals (private parts).                       Wash face,  Genitals (private parts) with your normal soap.             6.  Wash thoroughly, paying special attention to the area where your surgery  will be performed.  7.  Thoroughly rinse your body with warm water from the neck down.  8.  DO NOT shower/wash with your normal soap after using and rinsing off  the CHG Soap.                9.  Pat yourself dry with a clean towel.            10.  Wear clean pajamas.            11.  Place clean sheets on your bed the night of your first shower and do not  sleep with pets. Day of Surgery : Do not apply any lotions/deodorants the morning of surgery.  Please wear clean clothes to the hospital/surgery center.  FAILURE TO FOLLOW THESE INSTRUCTIONS MAY RESULT IN THE CANCELLATION OF YOUR SURGERY PATIENT SIGNATURE_________________________________  NURSE SIGNATURE__________________________________  ________________________________________________________________________   Adam Phenix  An incentive spirometer is a tool that can help keep your lungs clear and active. This tool measures how well you are filling your lungs with each breath. Taking long deep breaths may help reverse or decrease the chance of developing breathing (pulmonary) problems (especially infection) following:  A long period of time when you are unable to move or be  active. BEFORE THE PROCEDURE   If the spirometer includes an indicator to show your best effort, your nurse or respiratory therapist will set it to a desired goal.  If possible, sit up straight or lean slightly forward. Try not to slouch.  Hold the incentive spirometer in an upright position. INSTRUCTIONS FOR USE  1. Sit on the edge of your bed if possible, or sit up as far as you can in bed or on a chair. 2. Hold the incentive spirometer in an upright position. 3. Breathe out normally. 4. Place the mouthpiece in your mouth and seal your lips tightly around it. 5. Breathe in slowly and as deeply as possible, raising the piston or the ball toward the top of the column. 6. Hold your breath for 3-5 seconds or for as long as possible. Allow the piston or ball to fall to the bottom of the column. 7. Remove the mouthpiece from your mouth and breathe out normally. 8. Rest for a few seconds and repeat Steps 1 through 7 at least 10 times every 1-2 hours when you are awake. Take your time and take a few normal breaths between deep breaths. 9. The spirometer may include an indicator to show your best effort. Use the indicator as a goal to work toward during each repetition. 10. After each set of 10 deep breaths, practice coughing to be sure your lungs are clear. If you have an incision (the cut made at the time of surgery), support your incision when coughing by placing a pillow or rolled up towels firmly against it. Once you are able to get out of bed, walk around indoors and cough well. You may stop using the incentive spirometer when instructed by your caregiver.  RISKS AND COMPLICATIONS  Take your time so you do not get  dizzy or light-headed.  If you are in pain, you may need to take or ask for pain medication before doing incentive spirometry. It is harder to take a deep breath if you are having pain. AFTER USE  Rest and breathe slowly and easily.  It can be helpful to keep track of a log of  your progress. Your caregiver can provide you with a simple table to help with this. If you are using the spirometer at home, follow these instructions: [place] IF:   You are having difficultly using the spirometer.  You have trouble using the spirometer as often as instructed.  Your pain medication is not giving enough relief while using the spirometer.  You develop fever of 100.5 F (38.1 C) or higher. SEEK IMMEDIATE MEDICAL CARE IF:   You cough up bloody sputum that had not been present before.  You develop fever of 102 F (38.9 C) or greater.  You develop worsening pain at or near the incision site. MAKE SURE YOU:   Understand these instructions.  Will watch your condition.  Will get help right away if you are not doing well or get worse. Document Released: 08/07/2006 Document Revised: 06/19/2011 Document Reviewed: 10/08/2006 Exit[employer] Patient Information 2014 [employer], [place].   ________________________________________________________________________  WHAT IS A BLOOD TRANSFUSION? Blood Transfusion Information  A transfusion is the replacement of blood or some of its parts. Blood is made up of multiple cells which provide different functions.  Red blood cells carry oxygen and are used for blood loss replacement.  White blood cells fight against infection.  Platelets control bleeding.  Plasma helps clot blood.  Other blood products are available for specialized needs, such as hemophilia or other clotting disorders. BEFORE THE TRANSFUSION  Who gives blood for transfusions?   Healthy volunteers who are fully evaluated to make sure their blood is safe. This is blood bank blood. Transfusion therapy is the safest it has ever been in the practice of medicine. Before blood is taken from a donor, a complete history is taken to make sure that person has no history of diseases nor engages in risky social behavior (examples are intravenous drug use or sexual activity  with multiple partners). The donor's travel history is screened to minimize risk of transmitting infections, such as malaria. The donated blood is tested for signs of infectious diseases, such as HIV and hepatitis. The blood is then tested to be sure it is compatible with you in order to minimize the chance of a transfusion reaction. If you or a relative donates blood, this is often done in anticipation of surgery and is not appropriate for emergency situations. It takes many days to process the donated blood. RISKS AND COMPLICATIONS Although transfusion therapy is very safe and saves many lives, the main dangers of transfusion include:   Getting an infectious disease.  Developing a transfusion reaction. This is an allergic reaction to something in the blood you were given. Every precaution is taken to prevent this. The decision to have a blood transfusion has been considered carefully by your caregiver before blood is given. Blood is not given unless the benefits outweigh the risks. AFTER THE TRANSFUSION  Right after receiving a blood transfusion, you will usually feel much better and more energetic. This is especially true if your red blood cells have gotten low (anemic). The transfusion raises the level of the red blood cells which carry oxygen, and this usually causes an energy increase.  The nurse administering the transfusion will  monitor you carefully for complications. HOME CARE INSTRUCTIONS  No special instructions are needed after a transfusion. You may find your energy is better. Speak with your caregiver about any limitations on activity for underlying diseases you may have. SEEK MEDICAL CARE IF:   Your condition is not improving after your transfusion.  You develop redness or irritation at the intravenous (IV) site. SEEK IMMEDIATE MEDICAL CARE IF:  Any of the following symptoms occur over the next 12 hours:  Shaking chills.  You have a temperature by mouth above 102 F (38.9  C), not controlled by medicine.  Chest, back, or muscle pain.  People around you feel you are not acting correctly or are confused.  Shortness of breath or difficulty breathing.  Dizziness and fainting.  You get a rash or develop hives.  You have a decrease in urine output.  Your urine turns a dark color or changes to pink, red, or brown. Any of the following symptoms occur over the next 10 days:  You have a temperature by mouth above 102 F (38.9 C), not controlled by medicine.  Shortness of breath.  Weakness after normal activity.  The white part of the eye turns yellow (jaundice).  You have a decrease in the amount of urine or are urinating less often.  Your urine turns a dark color or changes to pink, red, or brown. Document Released: 03/24/2000 Document Revised: 06/19/2011 Document Reviewed: 11/11/2007 [facility] Patient Information 2014 [place], [place].  _______________________________________________________________________

## 2017-06-21 NOTE — H&P (Signed)
TOTAL HIP ADMISSION H&P  Patient is admitted for left total hip arthroplasty, anterior approach.  Subjective:  Chief Complaint:   Left hip primary OA / pain  HPI: Karina Tran, 77 y.o. female, has a history of pain and functional disability in the left hip(s) due to arthritis and patient has failed non-surgical conservative treatments for greater than 12 weeks to include NSAID's and/or analgesics, use of assistive devices and activity modification.  Onset of symptoms was gradual starting 2+ years ago with gradually worsening course since that time.The patient noted no past surgery on the left hip(s).  Patient currently rates pain in the left hip at 7 out of 10 with activity. Patient has night pain, worsening of pain with activity and weight bearing, trendelenberg gait, pain that interfers with activities of daily living and pain with passive range of motion. Patient has evidence of periarticular osteophytes and joint space narrowing by imaging studies. This condition presents safety issues increasing the risk of falls.  There is no current active infection.  Risks, benefits and expectations were discussed with the patient.  Risks including but not limited to the risk of anesthesia, blood clots, nerve damage, blood vessel damage, failure of the prosthesis, infection and up to and including death.  Patient understand the risks, benefits and expectations and wishes to proceed with surgery.   PCP: Corrington, Kip A, MD  D/C Plans:       Home  Post-op Meds:       No Rx given  Tranexamic Acid:      To be given - IV  Decadron:      Is to be given  FYI:     Xarelto (on pre-op - a-fib)  Norco  No NSAIDs (No Celebrex)  DME:   Pt already has equipment  PT:   No PT    Patient Active Problem List   Diagnosis Date Noted  . ILD (interstitial lung disease) ([facility]) 05/17/2016  . Asbestos exposure 05/17/2016  . Raynaud phenomenon 05/17/2016  . Shortness of breath 03/22/2016  . GERD  (gastroesophageal reflux disease) 03/22/2016  . Arthritis 03/22/2016  . Chronic seasonal allergic rhinitis 03/22/2016  . Pancreatitis 03/09/2015  . Essential hypertension   . Loss of weight   . Thoracic compression fracture ([place])   . Paroxysmal atrial fibrillation ([facility])   . Atrial flutter ([place]) 11/17/2014  . Breast cancer of upper-outer quadrant of left female breast ([facility]) 06/25/2014   Past Medical History:  Diagnosis Date  . Allergic rhinitis   . Breast cancer ([facility])       . Complication of anesthesia    " TAKES ALONG TIME TO WAKE ME "  . GERD (gastroesophageal reflux disease)   . History of kidney infection   . Hypertension   . Persistent atrial fibrillation ([place])    a. s/p PVI 2009 in [place]  . Spinal stenosis     Past Surgical History:  Procedure Laterality Date  . ABLATION  2009   PVI in [place]  . APPENDECTOMY    . BREAST LUMPECTOMY Left 2007  . BREAST SURGERY     partial mastectomy  . CARDIOVERSION N/A 11/18/2014   Procedure: CARDIOVERSION;  Surgeon: Adrian Prows, MD;  Location: [facility];  Service: Cardiovascular;  Laterality: N/A;  . carpel tunnel    . CESAREAN SECTION    . GANGLION CYST EXCISION    . HEMORRHOID SURGERY    . KYPHOPLASTY  2017  . TONSILLECTOMY      No current facility-administered  medications for this encounter.    Current Outpatient Medications  Medication Sig Dispense Refill Last Dose  . amLODipine (NORVASC) 2.5 MG tablet Take 2.5 mg by mouth daily.    03/12/2017 at Unknown time  . atenolol (TENORMIN) 50 MG tablet Take 50 mg by mouth daily.   03/12/2017 at Unknown time  . Calcium Carb-Cholecalciferol (CALCIUM 600+D3 PO) Take 1 tablet by mouth daily.     . Coenzyme Q10 (CO Q10) 100 MG CAPS Take 100 mg by mouth daily.     . diclofenac sodium (VOLTAREN) 1 % GEL Apply 1 application topically 4 (four) times daily as needed. For knee pain.  0   . flecainide (TAMBOCOR) 50 MG tablet Take 1 tablet (50 mg total) by mouth every 12 (twelve) hours. (Patient taking  differently: Take 50 mg by mouth daily. ) 60 tablet 0 03/12/2017 at Unknown time  . gabapentin (NEURONTIN) 300 MG capsule Take 600 mg by mouth at bedtime.     . hydrochlorothiazide (HYDRODIURIL) 25 MG tablet Take 25 mg by mouth daily.   03/12/2017 at Unknown time  . pantoprazole (PROTONIX) 40 MG tablet Take 40 mg by mouth daily.   03/12/2017 at Unknown time  . Potassium Gluconate 550 MG TABS Take 550 mg by mouth daily.     . rivaroxaban (XARELTO) 20 MG TABS tablet Take 1 tablet (20 mg total) by mouth daily with supper. 30 tablet 0 03/11/2017 at 1030  . vitamin E 400 UNIT capsule Take 400 Units by mouth daily.    03/12/2017 at Unknown time   Allergies  Allergen Reactions  . Aspirin     Bloody noses  . Demerol [Meperidine]     vomiting    Social History   Tobacco Use  . Smoking status: Passive Smoke Exposure - Never Smoker  . Smokeless tobacco: Never Used  . Tobacco comment: exposed to second-hand smoke her entire life  Substance Use Topics  . Alcohol use: Yes    Comment: weekley beer    Family History  Problem Relation Age of Onset  . Lung cancer Sister   . Heart failure Mother   . Brain cancer Father   . Lung disease Neg Hx   . Rheumatologic disease Neg Hx      Review of Systems  Constitutional: Negative.   HENT: Negative.   Eyes: Negative.   Respiratory: Negative.   Cardiovascular: Negative.   Gastrointestinal: Positive for heartburn.  Genitourinary: Negative.   Musculoskeletal: Positive for joint pain.  Skin: Negative.   Neurological: Negative.   Endo/Heme/Allergies: Negative.   Psychiatric/Behavioral: Negative.     Objective:  Physical Exam  Constitutional: She is oriented to person, place, and time. She appears well-developed.  HENT:  Head: Normocephalic.  Eyes: Pupils are equal, round, and reactive to light.  Neck: Neck supple. No JVD present. No tracheal deviation present. No thyromegaly present.  Cardiovascular: Normal rate, regular rhythm and intact distal  pulses.  Respiratory: Effort normal and breath sounds normal. No respiratory distress. She has no wheezes.  GI: Soft. There is no tenderness. There is no guarding.  Musculoskeletal:       Left hip: She exhibits decreased range of motion, decreased strength, tenderness and bony tenderness. She exhibits no swelling, no deformity and no laceration.  Lymphadenopathy:    She has no cervical adenopathy.  Neurological: She is alert and oriented to person, place, and time.  Skin: Skin is warm and dry.  Psychiatric: She has a normal mood and affect.  Labs:  Estimated body mass index is 26.59 kg/m as calculated from the following:   Height as of 05/17/16: 5\' 4"  (1.626 m).   Weight as of 05/17/16: 70.3 kg (154 lb 14.4 oz).   Imaging Review Plain radiographs demonstrate severe degenerative joint disease of the left hip(s). The bone quality appears to be good for age and reported activity level.  Assessment/Plan:  End stage arthritis, left hip  The patient history, physical examination, clinical judgement of the provider and imaging studies are consistent with end stage degenerative joint disease of the left hip and total hip arthroplasty is deemed medically necessary. The treatment options including medical management, injection therapy, arthroscopy and arthroplasty were discussed at length. The risks and benefits of total hip arthroplasty were presented and reviewed. The risks due to aseptic loosening, infection, stiffness, dislocation/subluxation,  thromboembolic complications and other imponderables were discussed.  The patient acknowledged the explanation, agreed to proceed with the plan and consent was signed. Patient is being admitted for inpatient treatment for surgery, pain control, PT, OT, prophylactic antibiotics, VTE prophylaxis, progressive ambulation and ADL's and discharge planning.The patient is planning to be discharged home.      [place] Konni Kesinger   PA-C  06/21/2017, 2:47 PM

## 2017-06-25 ENCOUNTER — Other Ambulatory Visit: Payer: Self-pay

## 2017-06-25 ENCOUNTER — Encounter (HOSPITAL_COMMUNITY): Payer: Self-pay

## 2017-06-25 ENCOUNTER — Encounter (HOSPITAL_COMMUNITY)
Admission: RE | Admit: 2017-06-25 | Discharge: 2017-06-25 | Disposition: A | Discharge status: Home / Self Care | Payer: Medicare Other | Source: Ambulatory Visit | Attending: Orthopedic Surgery | Admitting: Orthopedic Surgery

## 2017-06-25 DIAGNOSIS — Z1612 Extended spectrum beta lactamase (ESBL) resistance: Secondary | ICD-10-CM | POA: Diagnosis not present

## 2017-06-25 DIAGNOSIS — Z01818 Encounter for other preprocedural examination: Secondary | ICD-10-CM | POA: Diagnosis not present

## 2017-06-25 HISTORY — DX: Personal history of other diseases of the circulatory system: Z86.79

## 2017-06-25 HISTORY — DX: Hyperlipidemia, unspecified: E78.5

## 2017-06-25 HISTORY — DX: Contact with and (suspected) exposure to asbestos: Z77.090

## 2017-06-25 HISTORY — DX: Personal history of other diseases of the digestive system: Z87.19

## 2017-06-25 HISTORY — DX: Interstitial pulmonary disease, unspecified: J84.9

## 2017-06-25 HISTORY — DX: Raynaud's syndrome without gangrene: I73.00

## 2017-06-25 HISTORY — DX: Iron deficiency anemia, unspecified: D50.9

## 2017-06-25 HISTORY — DX: Dyspnea, unspecified: R06.00

## 2017-06-25 LAB — CBC
HEMATOCRIT: 45.8 % (ref 36.0–46.0)
Hemoglobin: 15.1 g/dL — ABNORMAL HIGH (ref 12.0–15.0)
MCH: 30.3 pg (ref 26.0–34.0)
MCHC: 33 g/dL (ref 30.0–36.0)
MCV: 91.8 fL (ref 78.0–100.0)
Platelets: 321 10*3/uL (ref 150–400)
RBC: 4.99 MIL/uL (ref 3.87–5.11)
RDW: 14 % (ref 11.5–15.5)
WBC: 9.3 10*3/uL (ref 4.0–10.5)

## 2017-06-25 LAB — BASIC METABOLIC PANEL
Anion gap: 7 (ref 5–15)
BUN: 9 mg/dL (ref 6–20)
CALCIUM: 9.3 mg/dL (ref 8.9–10.3)
CO2: 35 mmol/L — AB (ref 22–32)
Chloride: 96 mmol/L — ABNORMAL LOW (ref 101–111)
Creatinine, Ser: 0.76 mg/dL (ref 0.44–1.00)
GFR calc Af Amer: >60 mL/min (ref >60)
Glucose, Bld: 105 mg/dL — ABNORMAL HIGH (ref 65–99)
POTASSIUM: 3.2 mmol/L — AB (ref 3.5–5.1)
Sodium: 138 mmol/L (ref 135–145)

## 2017-06-25 LAB — SURGICAL PCR SCREEN
MRSA, PCR: NEGATIVE
STAPHYLOCOCCUS AUREUS: NEGATIVE

## 2017-06-25 LAB — ABO/RH: ABO/RH(D): O NEG

## 2017-06-26 ENCOUNTER — Ambulatory Visit: Payer: Medicare Other

## 2017-06-26 ENCOUNTER — Ambulatory Visit
Admission: RE | Admit: 2017-06-26 | Discharge: 2017-06-26 | Disposition: A | Discharge status: Home / Self Care | Payer: Medicare Other | Source: Ambulatory Visit | Attending: Family Medicine | Admitting: Family Medicine

## 2017-06-26 DIAGNOSIS — Z1231 Encounter for screening mammogram for malignant neoplasm of breast: Secondary | ICD-10-CM

## 2017-06-26 NOTE — Progress Notes (Signed)
SURGICAL CLEARANCE DR. WHITE ON CHART

## 2017-07-02 ENCOUNTER — Encounter (HOSPITAL_COMMUNITY): Payer: Self-pay | Admitting: Certified Registered Nurse Anesthetist

## 2017-07-02 MED ORDER — TRANEXAMIC ACID 1000 MG/10ML IV SOLN
1000.0000 mg | INTRAVENOUS | Status: AC
  Administered 2017-07-03: 1000 mg via INTRAVENOUS
  Filled 2017-07-02: qty 1100

## 2017-07-03 ENCOUNTER — Inpatient Hospital Stay (HOSPITAL_COMMUNITY): Payer: Medicare Other | Admitting: Certified Registered Nurse Anesthetist

## 2017-07-03 ENCOUNTER — Inpatient Hospital Stay (HOSPITAL_COMMUNITY)
Admission: RE | Admit: 2017-07-03 | Discharge: 2017-07-04 | DRG: 470 | Disposition: A | Discharge status: Home / Self Care | Payer: Medicare Other | Source: Ambulatory Visit | Attending: Orthopedic Surgery | Admitting: Orthopedic Surgery

## 2017-07-03 ENCOUNTER — Inpatient Hospital Stay (HOSPITAL_COMMUNITY): Payer: Medicare Other

## 2017-07-03 ENCOUNTER — Encounter (HOSPITAL_COMMUNITY): Payer: Self-pay | Admitting: *Deleted

## 2017-07-03 ENCOUNTER — Other Ambulatory Visit: Payer: Self-pay

## 2017-07-03 ENCOUNTER — Encounter (HOSPITAL_COMMUNITY)
Admission: RE | Disposition: A | Discharge status: Home / Self Care | Payer: Self-pay | Source: Ambulatory Visit | Attending: Orthopedic Surgery

## 2017-07-03 DIAGNOSIS — Z96649 Presence of unspecified artificial hip joint: Secondary | ICD-10-CM

## 2017-07-03 DIAGNOSIS — Z6825 Body mass index (BMI) 25.0-25.9, adult: Secondary | ICD-10-CM | POA: Diagnosis not present

## 2017-07-03 DIAGNOSIS — Z96642 Presence of left artificial hip joint: Secondary | ICD-10-CM

## 2017-07-03 DIAGNOSIS — Z7722 Contact with and (suspected) exposure to environmental tobacco smoke (acute) (chronic): Secondary | ICD-10-CM | POA: Diagnosis present

## 2017-07-03 DIAGNOSIS — I10 Essential (primary) hypertension: Secondary | ICD-10-CM | POA: Diagnosis present

## 2017-07-03 DIAGNOSIS — I272 Pulmonary hypertension, unspecified: Secondary | ICD-10-CM | POA: Diagnosis present

## 2017-07-03 DIAGNOSIS — Z79899 Other long term (current) drug therapy: Secondary | ICD-10-CM | POA: Diagnosis not present

## 2017-07-03 DIAGNOSIS — Z885 Allergy status to narcotic agent status: Secondary | ICD-10-CM | POA: Diagnosis not present

## 2017-07-03 DIAGNOSIS — Z7901 Long term (current) use of anticoagulants: Secondary | ICD-10-CM

## 2017-07-03 DIAGNOSIS — M1612 Unilateral primary osteoarthritis, left hip: Principal | ICD-10-CM | POA: Diagnosis present

## 2017-07-03 DIAGNOSIS — J849 Interstitial pulmonary disease, unspecified: Secondary | ICD-10-CM | POA: Diagnosis present

## 2017-07-03 DIAGNOSIS — Z9181 History of falling: Secondary | ICD-10-CM | POA: Diagnosis not present

## 2017-07-03 DIAGNOSIS — Z91041 Radiographic dye allergy status: Secondary | ICD-10-CM

## 2017-07-03 DIAGNOSIS — K219 Gastro-esophageal reflux disease without esophagitis: Secondary | ICD-10-CM | POA: Diagnosis present

## 2017-07-03 DIAGNOSIS — I480 Paroxysmal atrial fibrillation: Secondary | ICD-10-CM | POA: Diagnosis present

## 2017-07-03 DIAGNOSIS — E785 Hyperlipidemia, unspecified: Secondary | ICD-10-CM | POA: Diagnosis present

## 2017-07-03 DIAGNOSIS — I7300 Raynaud's syndrome without gangrene: Secondary | ICD-10-CM | POA: Diagnosis present

## 2017-07-03 DIAGNOSIS — Z853 Personal history of malignant neoplasm of breast: Secondary | ICD-10-CM

## 2017-07-03 DIAGNOSIS — Z77090 Contact with and (suspected) exposure to asbestos: Secondary | ICD-10-CM | POA: Diagnosis present

## 2017-07-03 DIAGNOSIS — E663 Overweight: Secondary | ICD-10-CM | POA: Diagnosis present

## 2017-07-03 DIAGNOSIS — Z886 Allergy status to analgesic agent status: Secondary | ICD-10-CM | POA: Diagnosis not present

## 2017-07-03 HISTORY — PX: TOTAL HIP ARTHROPLASTY: SHX124

## 2017-07-03 LAB — TYPE AND SCREEN
ABO/RH(D): O NEG
ANTIBODY SCREEN: NEGATIVE

## 2017-07-03 IMAGING — CR DG LUMBAR SPINE COMPLETE 4+V
5 series · 5 of 5 positions shown · non-contrast
Comparison: November 16, 2014.

CLINICAL DATA: Acute lower back pain after fall at home.

EXAM:
LUMBAR SPINE - COMPLETE 4+ VIEW

[l-spine ap]
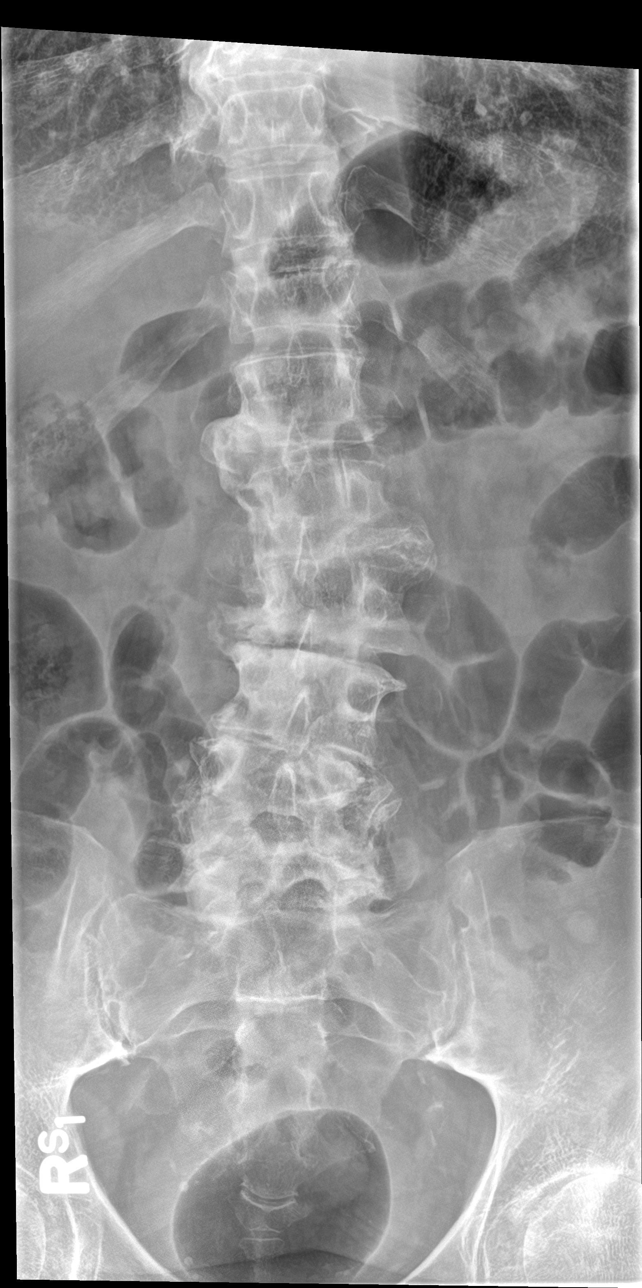

[l-spine obl (1 of 2)]
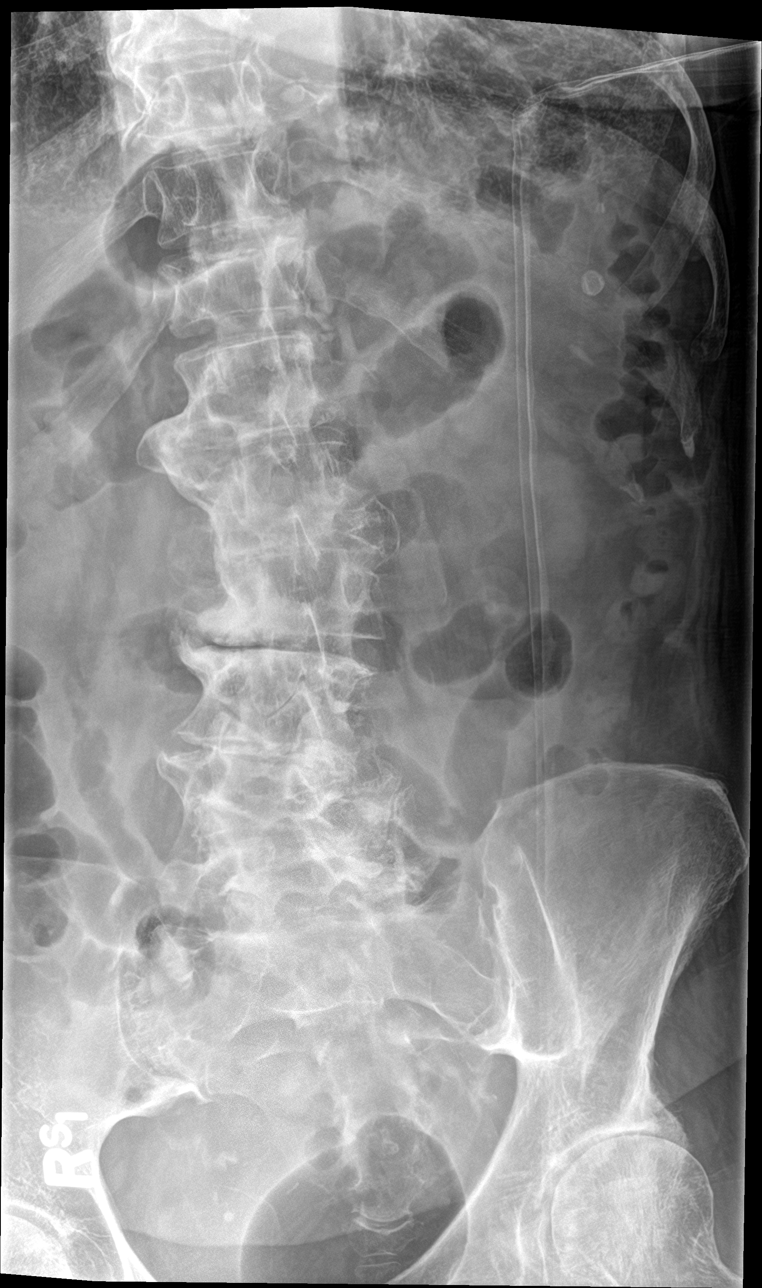

[l-spine obl (2 of 2)]
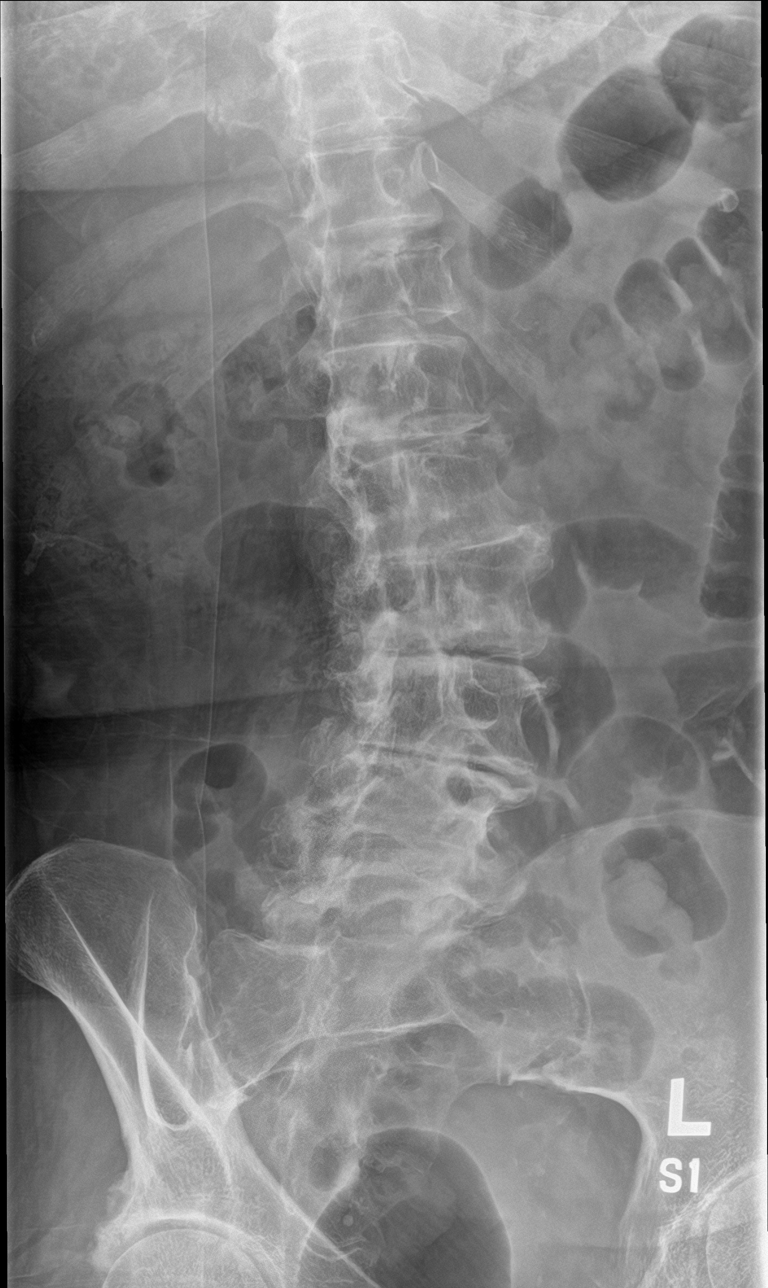

[l-spine lat]
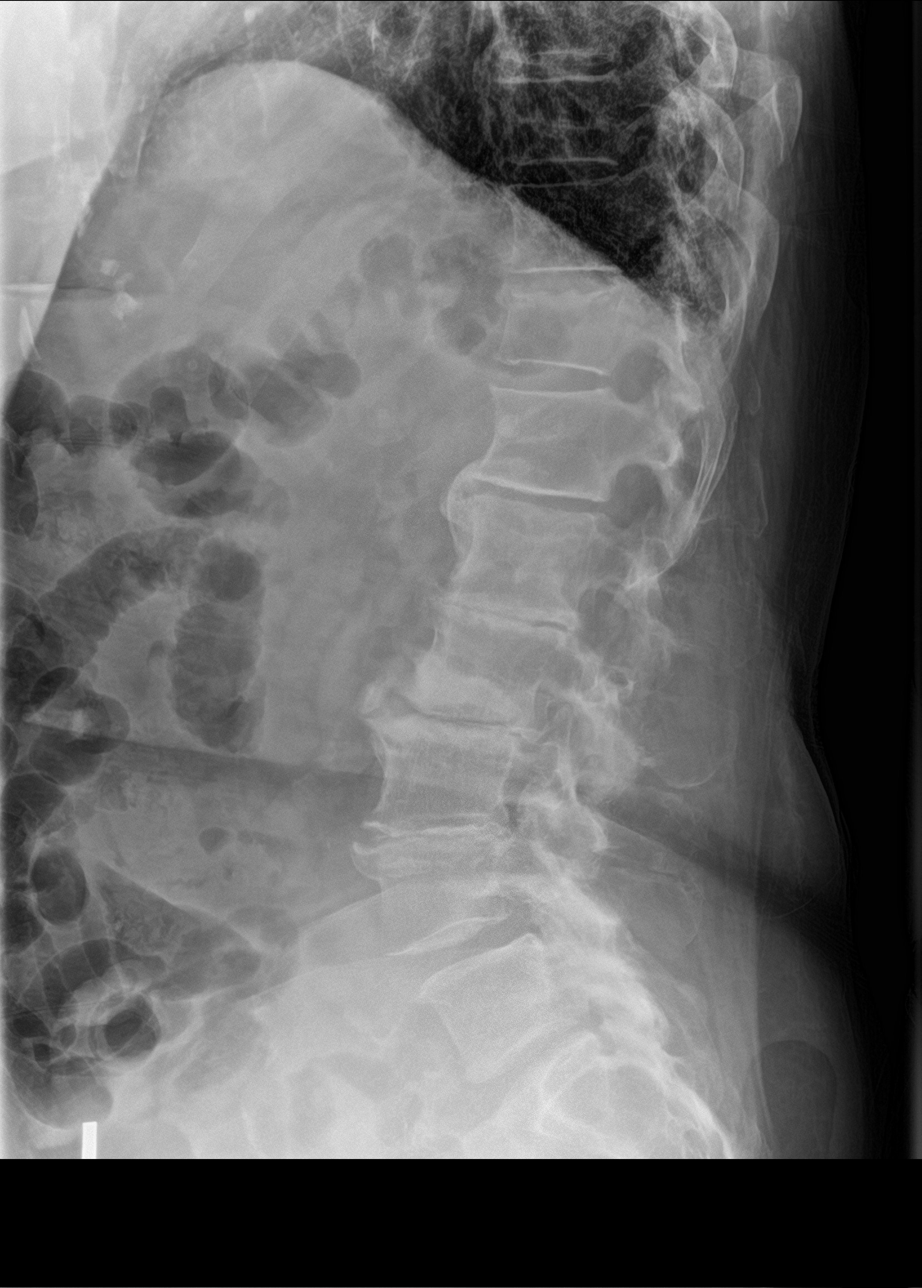

[l-spine spot]
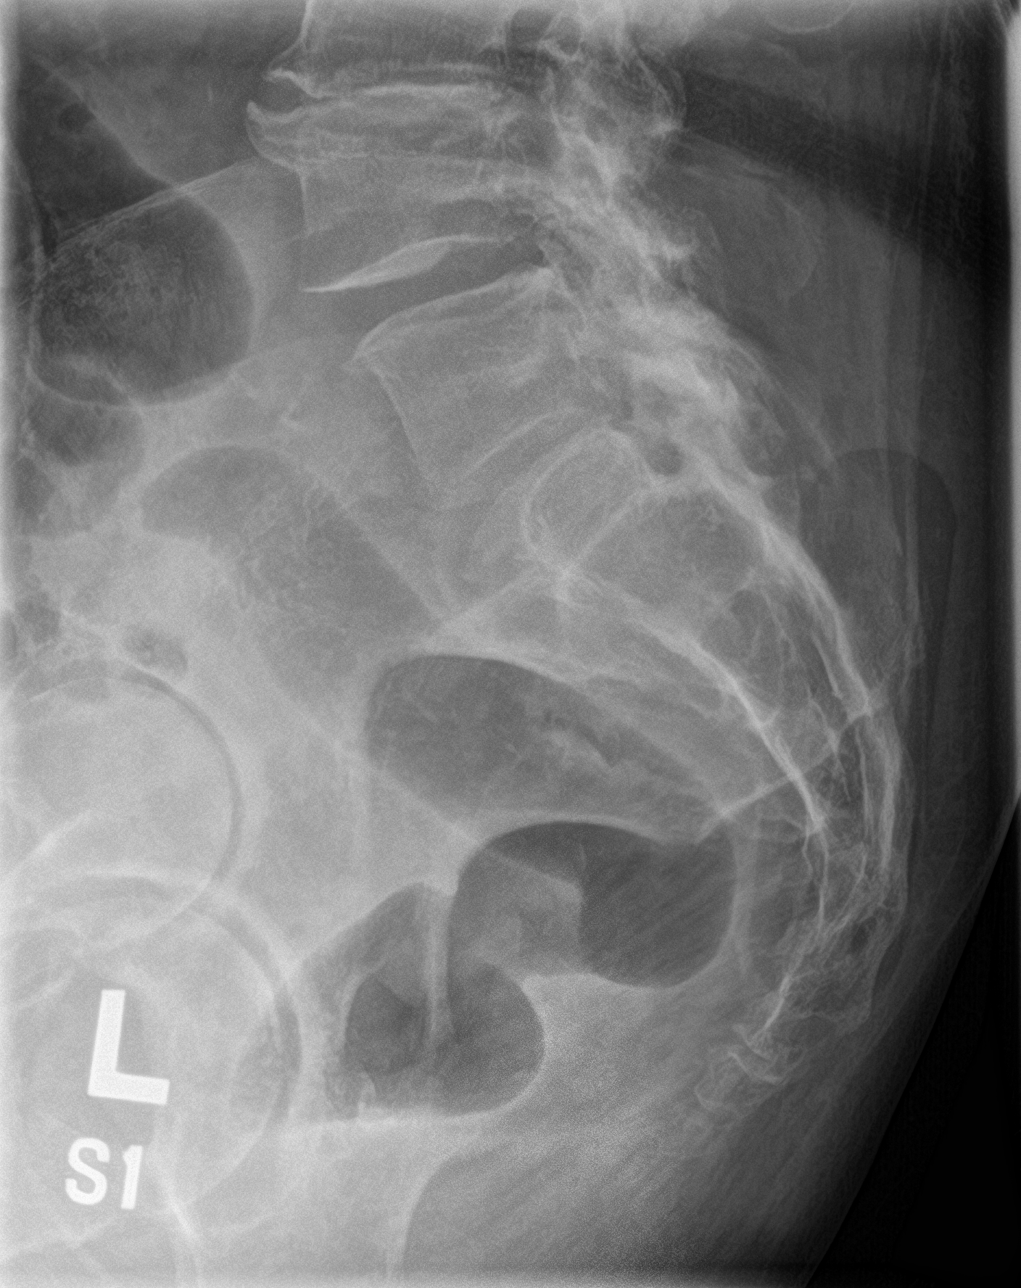

[5 of 5 positions shown; findings below may reference images not displayed]

FINDINGS: Mild levoscoliosis of lumbar spine is noted. Mild lateral
subluxation of L3 on L4 to the left is noted. There appears to be 6
non rib-bearing lumbar type vertebral bodies. The lowest will be
named S1 for purposes of this study. There is noted mild anterior
wedge compression deformity of T12 vertebral body which may
represent acute fracture. Severe degenerative disc disease is noted
at L1-2, L2-3, L3-4, and L4-5 with anterior osteophyte formation
minimal grade 1 anterolisthesis of L5-S1 is noted secondary to
posterior facet joint hypertrophy. Minimal grade 1 retrolisthesis of
L3-4 is noted secondary to severe degenerative disc disease.
IMPRESSION: Mild anterior wedge compression of T12 vertebral body is noted which
may represent acute fracture. CT scan may be performed for further
evaluation.

Severe multilevel degenerative disc disease is noted in the lumbar
spine.

## 2017-07-03 IMAGING — CT CT ABD-PELV W/ CM
2 of 5 series · 15 of 46 positions shown, 17 images · IV contrast (Omni 300)
Comparison: None

CLINICAL DATA: Fell in her bathroom, back pain radiating to
abdomen, elevated lipase, pain in spine and leg since fall, history
atrial fibrillation, hypertension, breast cancer

EXAM:
CT ABDOMEN AND PELVIS WITH CONTRAST
TECHNIQUE: Multidetector CT imaging of the abdomen and pelvis was performed
using the standard protocol following bolus administration of
intravenous contrast. Sagittal and coronal MPR images reconstructed
from axial data set.
CONTRAST:  100mL OMNIPAQUE IOHEXOL 300 MG/ML SOLN IV. No oral
contrast administered.

[Series 3: a/p w/ 5mm · axial · 0.87mm/px · z∈[+891,+1331]mm · 12 of 98 slices shown, 14 images]
[im 5/98  soft-tissue]
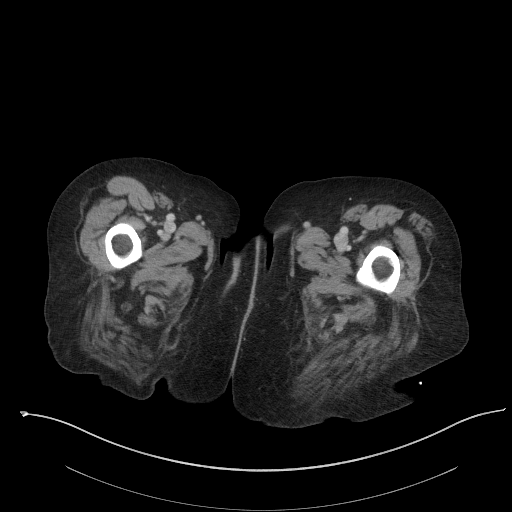
[im 5/98  bone]
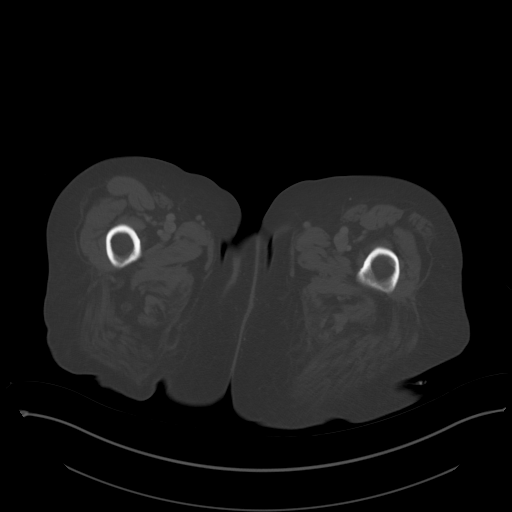
[im 15/98  soft-tissue]
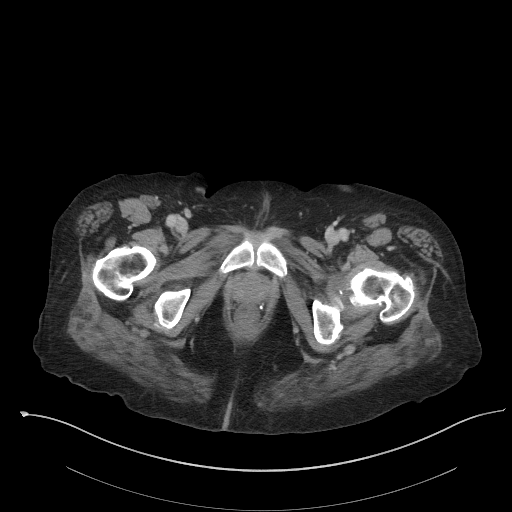
[im 20/98  soft-tissue]
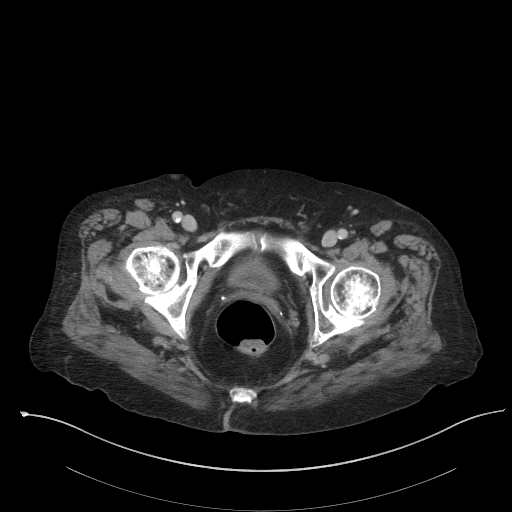
[im 30/98  soft-tissue]
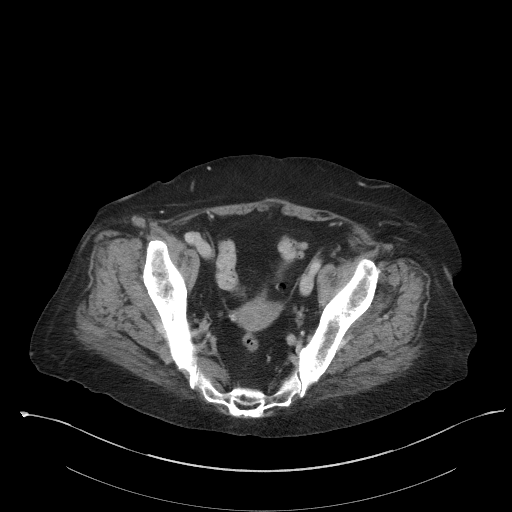
[im 39/98  soft-tissue]
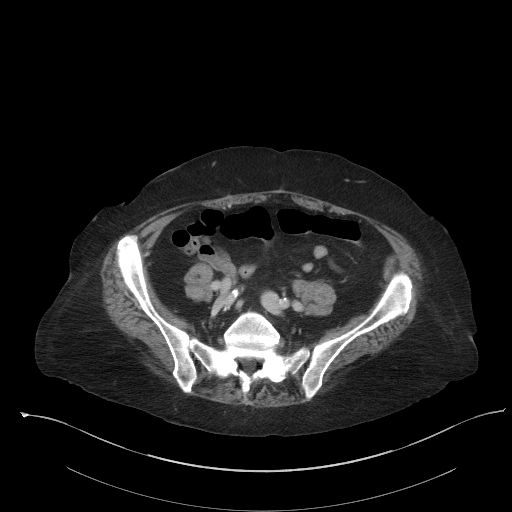
[im 44/98  soft-tissue]
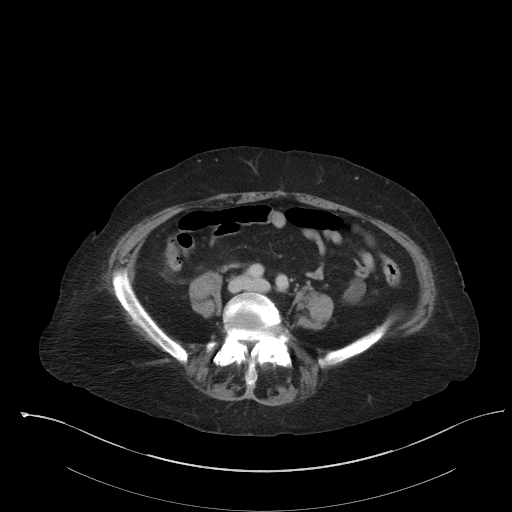
[im 54/98  soft-tissue]
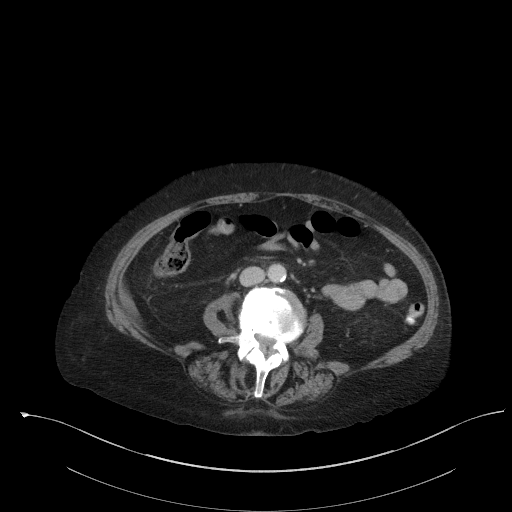
[im 59/98  soft-tissue]
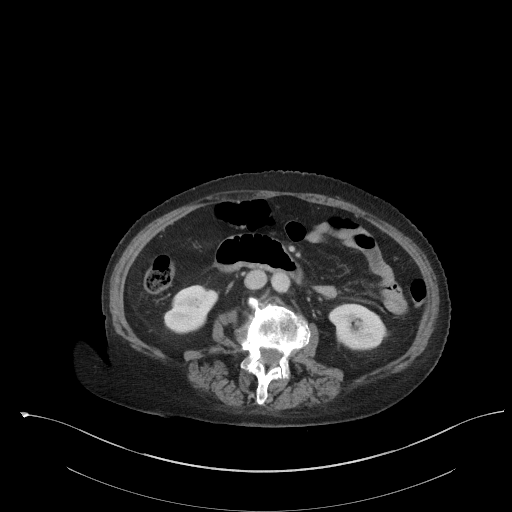
[im 68/98  soft-tissue]
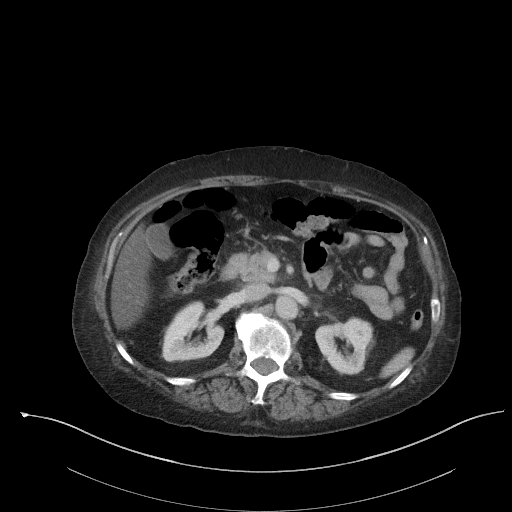
[im 68/98  bone]
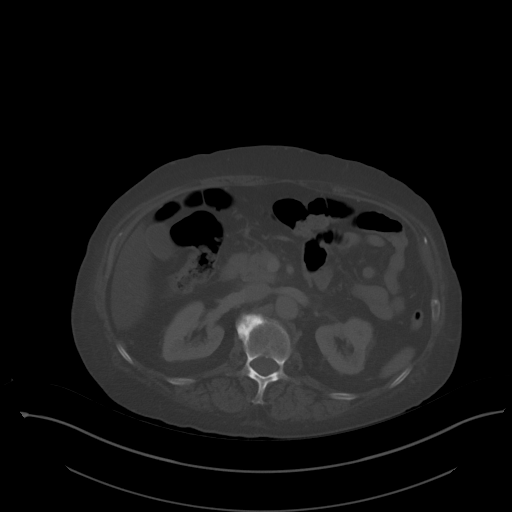
[im 78/98  soft-tissue]
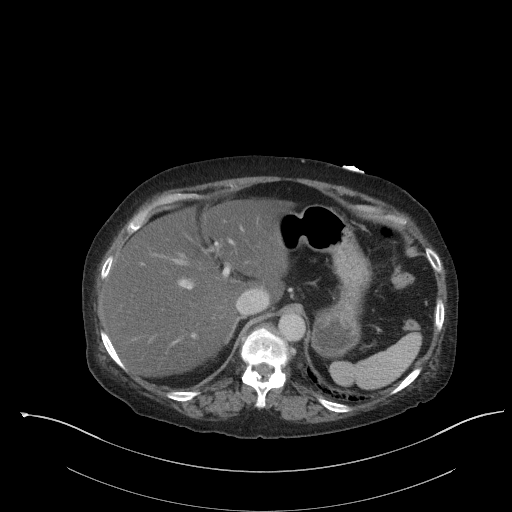
[im 83/98  soft-tissue]
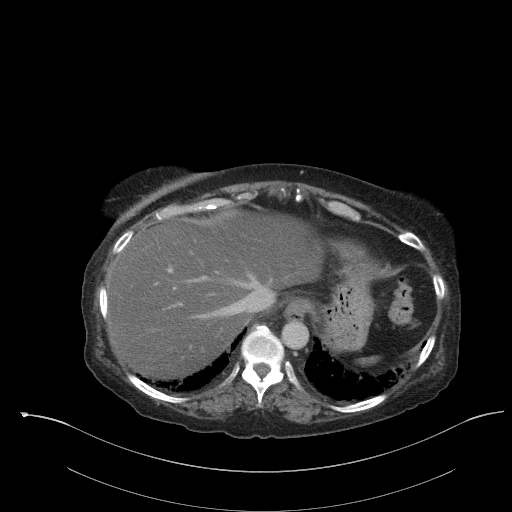
[im 93/98  soft-tissue]
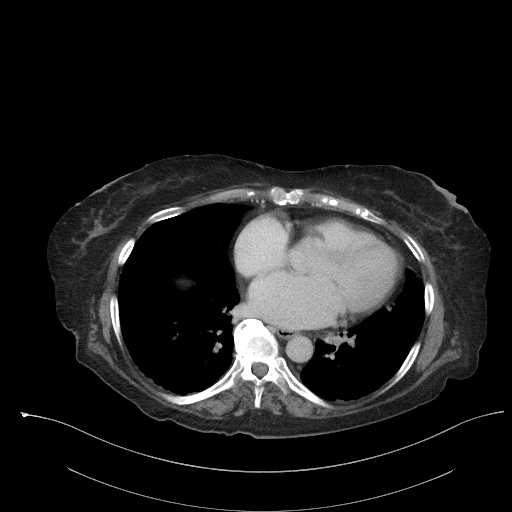

[Series 6: a/p w/ cor · coronal · 0.95mm/px · 3 of 124 slices shown]
[im 42/124  soft-tissue]
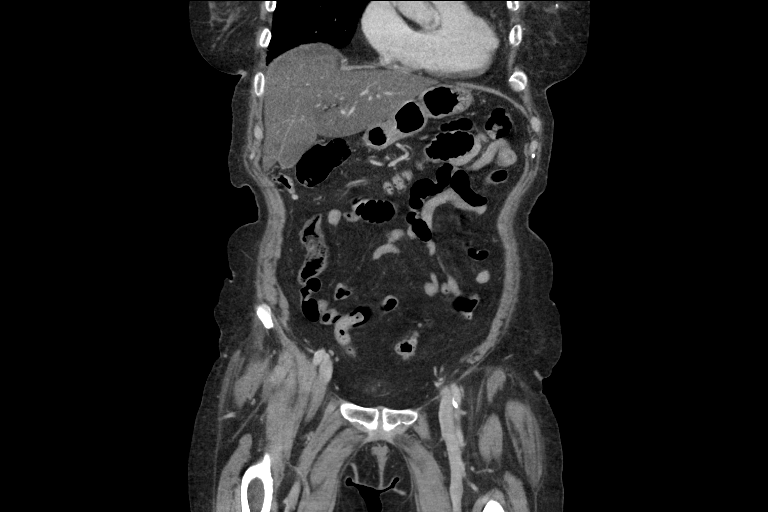
[im 55/124  soft-tissue]
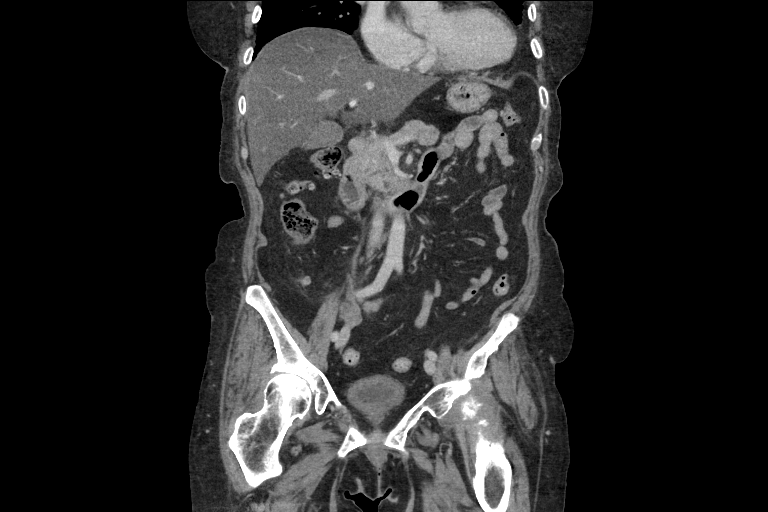
[im 69/124  soft-tissue]
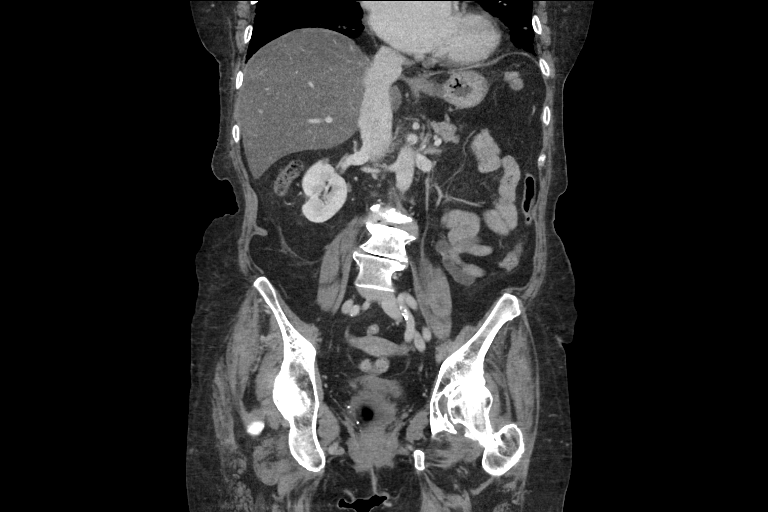

[15 of 46 positions shown; findings below may reference images not displayed]

FINDINGS: Bibasilar atelectasis.

Marked fatty infiltration of liver.

Liver, gallbladder, spleen, pancreas, kidneys, and adrenal glands
otherwise normal.

Specifically no CT evidence of pancreatitis identified.

Stomach incompletely distended, unable to exclude gastric wall
thickening with this appearance.

Scattered atherosclerotic calcifications.

Unremarkable uterus and adnexa.

Appendix surgically absent by history.

Bladder wall mildly prominent but bladder is decompressed, suspect
artifact.

Unremarkable ureters.

Scattered colonic diverticulosis without evidence of diverticulitis.

No mass, adenopathy, free fluid, free air, inflammatory process, or
hernia.

Bones demineralized with degenerative disc and facet disease changes
thoracolumbar spine.

Superior endplate compression fracture T11, with mild anterior
height loss, likely acute.
IMPRESSION: Diffuse fatty infiltration of liver.

Colonic diverticulosis without evidence of diverticulitis.

Mild superior endplate compression fracture of T11, likely acute.

## 2017-07-03 SURGERY — ARTHROPLASTY, HIP, TOTAL, ANTERIOR APPROACH
Anesthesia: General | Site: Hip | Laterality: Left

## 2017-07-03 MED ORDER — HYDROCODONE-ACETAMINOPHEN 5-325 MG PO TABS
1.0000 | ORAL_TABLET | ORAL | Status: DC | PRN
  Filled 2017-07-03: qty 1

## 2017-07-03 MED ORDER — ONDANSETRON HCL 4 MG/2ML IJ SOLN
INTRAMUSCULAR | Status: AC
  Filled 2017-07-03: qty 2

## 2017-07-03 MED ORDER — FENTANYL CITRATE (PF) 100 MCG/2ML IJ SOLN
INTRAMUSCULAR | Status: AC
  Filled 2017-07-03: qty 2

## 2017-07-03 MED ORDER — MORPHINE SULFATE (PF) 2 MG/ML IV SOLN: 0.5000 mg | INTRAVENOUS | Status: DC | PRN

## 2017-07-03 MED ORDER — TRANEXAMIC ACID 1000 MG/10ML IV SOLN
1000.0000 mg | Freq: Once | INTRAVENOUS | Status: AC
  Administered 2017-07-03: 14:00:00 1000 mg via INTRAVENOUS
  Filled 2017-07-03: qty 1100

## 2017-07-03 MED ORDER — METOCLOPRAMIDE HCL 5 MG PO TABS: 5.0000 mg | ORAL_TABLET | Freq: Three times a day (TID) | ORAL | Status: DC | PRN

## 2017-07-03 MED ORDER — CEFAZOLIN SODIUM-DEXTROSE 2-4 GM/100ML-% IV SOLN
2.0000 g | INTRAVENOUS | Status: AC
  Administered 2017-07-03: 2 g via INTRAVENOUS
  Filled 2017-07-03: qty 100

## 2017-07-03 MED ORDER — FERROUS SULFATE 325 (65 FE) MG PO TABS
325.0000 mg | ORAL_TABLET | Freq: Three times a day (TID) | ORAL | Status: DC
  Filled 2017-07-03 (×2): qty 1

## 2017-07-03 MED ORDER — EPHEDRINE SULFATE-NACL 50-0.9 MG/10ML-% IV SOSY
PREFILLED_SYRINGE | INTRAVENOUS | Status: DC | PRN
  Administered 2017-07-03: 5 mg via INTRAVENOUS

## 2017-07-03 MED ORDER — GLYCOPYRROLATE 0.2 MG/ML IV SOSY
PREFILLED_SYRINGE | INTRAVENOUS | Status: AC
  Filled 2017-07-03: qty 5

## 2017-07-03 MED ORDER — ONDANSETRON HCL 4 MG/2ML IJ SOLN
4.0000 mg | Freq: Four times a day (QID) | INTRAMUSCULAR | Status: DC | PRN
  Administered 2017-07-03: 15:00:00 4 mg via INTRAVENOUS
  Filled 2017-07-03: qty 2

## 2017-07-03 MED ORDER — GLYCOPYRROLATE 0.2 MG/ML IJ SOLN
INTRAMUSCULAR | Status: DC | PRN
  Administered 2017-07-03: 0.1 mg via INTRAVENOUS

## 2017-07-03 MED ORDER — ROCURONIUM BROMIDE 10 MG/ML (PF) SYRINGE
PREFILLED_SYRINGE | INTRAVENOUS | Status: DC | PRN
  Administered 2017-07-03: 50 mg via INTRAVENOUS

## 2017-07-03 MED ORDER — MENTHOL 3 MG MT LOZG: 1.0000 | LOZENGE | OROMUCOSAL | Status: DC | PRN

## 2017-07-03 MED ORDER — LIDOCAINE 2% (20 MG/ML) 5 ML SYRINGE
INTRAMUSCULAR | Status: DC | PRN
  Administered 2017-07-03: 60 mg via INTRAVENOUS

## 2017-07-03 MED ORDER — ALUM & MAG HYDROXIDE-SIMETH 200-200-20 MG/5ML PO SUSP: 15.0000 mL | ORAL | Status: DC | PRN

## 2017-07-03 MED ORDER — DIPHENHYDRAMINE HCL 12.5 MG/5ML PO ELIX: 12.5000 mg | ORAL_SOLUTION | ORAL | Status: DC | PRN

## 2017-07-03 MED ORDER — PHENYLEPHRINE 40 MCG/ML (10ML) SYRINGE FOR IV PUSH (FOR BLOOD PRESSURE SUPPORT)
PREFILLED_SYRINGE | INTRAVENOUS | Status: DC | PRN
  Administered 2017-07-03 (×2): 80 ug via INTRAVENOUS

## 2017-07-03 MED ORDER — SUGAMMADEX SODIUM 200 MG/2ML IV SOLN
INTRAVENOUS | Status: AC
  Filled 2017-07-03: qty 2

## 2017-07-03 MED ORDER — PROPOFOL 10 MG/ML IV BOLUS
INTRAVENOUS | Status: AC
  Filled 2017-07-03: qty 60

## 2017-07-03 MED ORDER — LACTATED RINGERS IV SOLN
INTRAVENOUS | Status: DC
  Administered 2017-07-03 (×2): via INTRAVENOUS

## 2017-07-03 MED ORDER — DEXAMETHASONE SODIUM PHOSPHATE 10 MG/ML IJ SOLN
10.0000 mg | Freq: Once | INTRAMUSCULAR | Status: AC
  Administered 2017-07-04: 09:00:00 10 mg via INTRAVENOUS
  Filled 2017-07-03: qty 1

## 2017-07-03 MED ORDER — POLYETHYLENE GLYCOL 3350 17 G PO PACK: 17.0000 g | PACK | Freq: Two times a day (BID) | ORAL | 0 refills | Status: DC

## 2017-07-03 MED ORDER — METHOCARBAMOL 500 MG PO TABS
500.0000 mg | ORAL_TABLET | Freq: Four times a day (QID) | ORAL | Status: DC | PRN
  Administered 2017-07-03: 500 mg via ORAL
  Filled 2017-07-03: qty 1

## 2017-07-03 MED ORDER — PANTOPRAZOLE SODIUM 40 MG PO TBEC
40.0000 mg | DELAYED_RELEASE_TABLET | Freq: Every day | ORAL | Status: DC
  Administered 2017-07-04: 40 mg via ORAL
  Filled 2017-07-03 (×2): qty 1

## 2017-07-03 MED ORDER — METHOCARBAMOL 500 MG PO TABS: 500.0000 mg | ORAL_TABLET | Freq: Four times a day (QID) | ORAL | 0 refills | Status: DC | PRN

## 2017-07-03 MED ORDER — FENTANYL CITRATE (PF) 100 MCG/2ML IJ SOLN
25.0000 ug | INTRAMUSCULAR | Status: DC | PRN
  Administered 2017-07-03 (×2): 25 ug via INTRAVENOUS

## 2017-07-03 MED ORDER — PHENOL 1.4 % MT LIQD: 1.0000 | OROMUCOSAL | Status: DC | PRN

## 2017-07-03 MED ORDER — AMLODIPINE BESYLATE 5 MG PO TABS
2.5000 mg | ORAL_TABLET | Freq: Every day | ORAL | Status: DC
  Administered 2017-07-04: 09:00:00 2.5 mg via ORAL
  Filled 2017-07-03: qty 1

## 2017-07-03 MED ORDER — MAGNESIUM CITRATE PO SOLN: 1.0000 | Freq: Once | ORAL | Status: DC | PRN

## 2017-07-03 MED ORDER — ACETAMINOPHEN 325 MG PO TABS: 325.0000 mg | ORAL_TABLET | Freq: Four times a day (QID) | ORAL | Status: DC | PRN

## 2017-07-03 MED ORDER — CHLORHEXIDINE GLUCONATE 4 % EX LIQD: 60.0000 mL | Freq: Once | CUTANEOUS | Status: DC

## 2017-07-03 MED ORDER — GABAPENTIN 300 MG PO CAPS: 600.0000 mg | ORAL_CAPSULE | Freq: Every day | ORAL | Status: DC

## 2017-07-03 MED ORDER — METOCLOPRAMIDE HCL 5 MG/ML IJ SOLN: 10.0000 mg | Freq: Once | INTRAMUSCULAR | Status: DC | PRN

## 2017-07-03 MED ORDER — MIDAZOLAM HCL 2 MG/2ML IJ SOLN
INTRAMUSCULAR | Status: AC
  Filled 2017-07-03: qty 2

## 2017-07-03 MED ORDER — DOCUSATE SODIUM 100 MG PO CAPS: 100.0000 mg | ORAL_CAPSULE | Freq: Two times a day (BID) | ORAL | 0 refills | Status: DC

## 2017-07-03 MED ORDER — DEXAMETHASONE SODIUM PHOSPHATE 10 MG/ML IJ SOLN
10.0000 mg | Freq: Once | INTRAMUSCULAR | Status: AC
  Administered 2017-07-03: 5 mg via INTRAVENOUS

## 2017-07-03 MED ORDER — FENTANYL CITRATE (PF) 100 MCG/2ML IJ SOLN
INTRAMUSCULAR | Status: DC | PRN
  Administered 2017-07-03: 50 ug via INTRAVENOUS
  Administered 2017-07-03 (×2): 25 ug via INTRAVENOUS
  Administered 2017-07-03 (×3): 50 ug via INTRAVENOUS

## 2017-07-03 MED ORDER — TRAMADOL HCL 50 MG PO TABS
50.0000 mg | ORAL_TABLET | Freq: Four times a day (QID) | ORAL | Status: DC
  Administered 2017-07-03 – 2017-07-04 (×5): 50 mg via ORAL
  Filled 2017-07-03 (×5): qty 1

## 2017-07-03 MED ORDER — FLECAINIDE ACETATE 50 MG PO TABS
50.0000 mg | ORAL_TABLET | Freq: Every day | ORAL | Status: DC
  Administered 2017-07-04: 09:00:00 50 mg via ORAL
  Filled 2017-07-03: qty 1

## 2017-07-03 MED ORDER — FERROUS SULFATE 325 (65 FE) MG PO TABS: 325.0000 mg | ORAL_TABLET | Freq: Three times a day (TID) | ORAL | 3 refills | Status: DC

## 2017-07-03 MED ORDER — DOCUSATE SODIUM 100 MG PO CAPS
100.0000 mg | ORAL_CAPSULE | Freq: Two times a day (BID) | ORAL | Status: DC
  Administered 2017-07-03 – 2017-07-04 (×3): 100 mg via ORAL
  Filled 2017-07-03 (×3): qty 1

## 2017-07-03 MED ORDER — ONDANSETRON HCL 4 MG PO TABS: 4.0000 mg | ORAL_TABLET | Freq: Four times a day (QID) | ORAL | Status: DC | PRN

## 2017-07-03 MED ORDER — POLYETHYLENE GLYCOL 3350 17 G PO PACK
17.0000 g | PACK | Freq: Two times a day (BID) | ORAL | Status: DC
  Administered 2017-07-03: 15:00:00 17 g via ORAL
  Filled 2017-07-03 (×2): qty 1

## 2017-07-03 MED ORDER — DEXAMETHASONE SODIUM PHOSPHATE 10 MG/ML IJ SOLN
INTRAMUSCULAR | Status: AC
  Filled 2017-07-03: qty 1

## 2017-07-03 MED ORDER — METHOCARBAMOL 1000 MG/10ML IJ SOLN
500.0000 mg | Freq: Four times a day (QID) | INTRAMUSCULAR | Status: DC | PRN
  Administered 2017-07-03: 500 mg via INTRAVENOUS
  Filled 2017-07-03: qty 550

## 2017-07-03 MED ORDER — CEFAZOLIN SODIUM-DEXTROSE 2-4 GM/100ML-% IV SOLN
2.0000 g | Freq: Four times a day (QID) | INTRAVENOUS | Status: AC
  Administered 2017-07-03 (×2): 2 g via INTRAVENOUS
  Filled 2017-07-03 (×2): qty 100

## 2017-07-03 MED ORDER — PROPOFOL 10 MG/ML IV BOLUS
INTRAVENOUS | Status: DC | PRN
  Administered 2017-07-03: 100 mg via INTRAVENOUS

## 2017-07-03 MED ORDER — RIVAROXABAN 10 MG PO TABS
10.0000 mg | ORAL_TABLET | ORAL | Status: DC
  Administered 2017-07-04: 09:00:00 10 mg via ORAL
  Filled 2017-07-03: qty 1

## 2017-07-03 MED ORDER — SODIUM CHLORIDE 0.9 % IR SOLN
Status: DC | PRN
  Administered 2017-07-03: 1000 mL

## 2017-07-03 MED ORDER — SODIUM CHLORIDE 0.9 % IV SOLN
INTRAVENOUS | Status: DC
  Administered 2017-07-03: 19:00:00 via INTRAVENOUS
  Administered 2017-07-04: 1000 mL via INTRAVENOUS

## 2017-07-03 MED ORDER — MIDAZOLAM HCL 5 MG/5ML IJ SOLN
INTRAMUSCULAR | Status: DC | PRN
  Administered 2017-07-03: 1 mg via INTRAVENOUS

## 2017-07-03 MED ORDER — HYDROCHLOROTHIAZIDE 25 MG PO TABS
25.0000 mg | ORAL_TABLET | Freq: Every day | ORAL | Status: DC
  Administered 2017-07-03 – 2017-07-04 (×2): 25 mg via ORAL
  Filled 2017-07-03 (×2): qty 1

## 2017-07-03 MED ORDER — HYDROCODONE-ACETAMINOPHEN 7.5-325 MG PO TABS: 1.0000 | ORAL_TABLET | ORAL | 0 refills | Status: DC | PRN

## 2017-07-03 MED ORDER — METOCLOPRAMIDE HCL 5 MG/ML IJ SOLN: 5.0000 mg | Freq: Three times a day (TID) | INTRAMUSCULAR | Status: DC | PRN

## 2017-07-03 MED ORDER — BISACODYL 10 MG RE SUPP: 10.0000 mg | Freq: Every day | RECTAL | Status: DC | PRN

## 2017-07-03 MED ORDER — EPHEDRINE 5 MG/ML INJ
INTRAVENOUS | Status: AC
  Filled 2017-07-03: qty 10

## 2017-07-03 MED ORDER — HYDROCODONE-ACETAMINOPHEN 7.5-325 MG PO TABS: 1.0000 | ORAL_TABLET | ORAL | Status: DC | PRN

## 2017-07-03 MED ORDER — STERILE WATER FOR IRRIGATION IR SOLN
Status: DC | PRN
  Administered 2017-07-03: 2000 mL

## 2017-07-03 MED ORDER — SUGAMMADEX SODIUM 200 MG/2ML IV SOLN
INTRAVENOUS | Status: DC | PRN
  Administered 2017-07-03: 200 mg via INTRAVENOUS

## 2017-07-03 MED ORDER — ATENOLOL 50 MG PO TABS
50.0000 mg | ORAL_TABLET | Freq: Every day | ORAL | Status: DC
  Administered 2017-07-04: 09:00:00 50 mg via ORAL
  Filled 2017-07-03: qty 1

## 2017-07-03 SURGICAL SUPPLY — 34 items
BAG DECANTER FOR FLEXI CONT (MISCELLANEOUS) IMPLANT
BAG ZIPLOCK 12X15 (MISCELLANEOUS) IMPLANT
BLADE SAG 18X100X1.27 (BLADE) ×3 IMPLANT
CAPT HIP TOTAL 2 ×3 IMPLANT
COVER PERINEAL POST (MISCELLANEOUS) ×3 IMPLANT
COVER SURGICAL LIGHT HANDLE (MISCELLANEOUS) ×3 IMPLANT
DERMABOND ADVANCED (GAUZE/BANDAGES/DRESSINGS) ×2
DERMABOND ADVANCED .7 DNX12 (GAUZE/BANDAGES/DRESSINGS) ×1 IMPLANT
DRAPE STERI IOBAN 125X83 (DRAPES) ×3 IMPLANT
DRAPE U-SHAPE 47X51 STRL (DRAPES) ×6 IMPLANT
DRESSING AQUACEL AG SP 3.5X10 (GAUZE/BANDAGES/DRESSINGS) ×1 IMPLANT
DRSG AQUACEL AG SP 3.5X10 (GAUZE/BANDAGES/DRESSINGS) ×3
DURAPREP 26ML APPLICATOR (WOUND CARE) ×3 IMPLANT
ELECT REM PT RETURN 15FT ADLT (MISCELLANEOUS) ×3 IMPLANT
GLOVE BIOGEL M STRL SZ7.5 (GLOVE) ×6 IMPLANT
GLOVE BIOGEL PI IND STRL 7.5 (GLOVE) ×2 IMPLANT
GLOVE BIOGEL PI IND STRL 9 (GLOVE) ×1 IMPLANT
GLOVE BIOGEL PI INDICATOR 7.5 (GLOVE) ×4
GLOVE BIOGEL PI INDICATOR 9 (GLOVE) ×2
GLOVE ECLIPSE 8.5 STRL (GLOVE) IMPLANT
GLOVE ORTHO TXT STRL SZ7.5 (GLOVE) ×3 IMPLANT
GOWN STRL REUS W/TWL 2XL LVL3 (GOWN DISPOSABLE) IMPLANT
GOWN STRL REUS W/TWL LRG LVL3 (GOWN DISPOSABLE) ×9 IMPLANT
GOWN STRL REUS W/TWL XL LVL4 (GOWN DISPOSABLE) ×6 IMPLANT
HOLDER FOLEY CATH W/STRAP (MISCELLANEOUS) ×3 IMPLANT
PACK ANTERIOR HIP CUSTOM (KITS) ×3 IMPLANT
SUT MNCRL AB 4-0 PS2 18 (SUTURE) ×3 IMPLANT
SUT STRATAFIX 0 PDS 27 VIOLET (SUTURE) ×3
SUT VIC AB 1 CT1 36 (SUTURE) ×9 IMPLANT
SUT VIC AB 2-0 CT1 27 (SUTURE) ×4
SUT VIC AB 2-0 CT1 TAPERPNT 27 (SUTURE) ×2 IMPLANT
SUTURE STRATFX 0 PDS 27 VIOLET (SUTURE) ×1 IMPLANT
WATER STERILE IRR 1000ML POUR (IV SOLUTION) ×3 IMPLANT
YANKAUER SUCT BULB TIP 10FT TU (MISCELLANEOUS) IMPLANT

## 2017-07-03 NOTE — Anesthesia Postprocedure Evaluation (Signed)
Anesthesia Post Note  Patient: Karina Tran  Procedure(s) Performed: LEFT TOTAL HIP ARTHROPLASTY ANTERIOR APPROACH (Left Hip)     Patient location during evaluation: PACU Anesthesia Type: General Level of consciousness: awake and alert Pain management: pain level controlled Vital Signs Assessment: post-procedure vital signs reviewed and stable Respiratory status: spontaneous breathing, nonlabored ventilation, respiratory function stable and patient connected to nasal cannula oxygen Cardiovascular status: blood pressure returned to baseline and stable Postop Assessment: no apparent nausea or vomiting Anesthetic complications: no    Last Vitals:  Vitals:   07/03/17 1512 07/03/17 1526  BP: 92/66 116/61  Pulse: 74   Resp:    Temp:    SpO2: 100%     Last Pain:  Vitals:   07/03/17 1435  TempSrc:   PainSc: Asleep                 Montez Hageman

## 2017-07-03 NOTE — Transfer of Care (Signed)
Immediate Anesthesia Transfer of Care Note  Patient: Karina Tran  Procedure(s) Performed: LEFT TOTAL HIP ARTHROPLASTY ANTERIOR APPROACH (Left Hip)  Patient Location: PACU  Anesthesia Type:General  Level of Consciousness: awake and patient cooperative  Airway & Oxygen Therapy: Patient Spontanous Breathing and Patient connected to face mask  Post-op Assessment: Report given to RN and Post -op Vital signs reviewed and stable  Post vital signs: Reviewed and stable  Last Vitals:  Vitals Value Taken Time  BP 124/72 07/03/2017 10:42 AM  Temp    Pulse 89 07/03/2017 10:44 AM  Resp 19 07/03/2017 10:44 AM  SpO2 100 % 07/03/2017 10:44 AM  Vitals shown include unvalidated device data.  Last Pain:  Vitals:   07/03/17 0704  PainSc: 2          Complications: No apparent anesthesia complications

## 2017-07-03 NOTE — Discharge Instructions (Addendum)
o Remove items at home which could result in a fall. This includes throw rugs or furniture in walking pathways o ICE to the affected joint every three hours while awake for 30 minutes at a time, for at least the first 3-5 days, and then as needed for pain and swelling.  Continue to use ice for pain and swelling. You may notice swelling that will progress down to the foot and ankle.  This is normal after surgery.  Elevate your leg when you are not up walking on it.   o Continue to use the breathing machine you got in the hospital (incentive spirometer) which will help keep your temperature down.  It is common for your temperature to cycle up and down following surgery, especially at night when you are not up moving around and exerting yourself.  The breathing machine keeps your lungs expanded and your temperature down.   DIET:  As you were doing prior to hospitalization, we recommend a well-balanced diet.  DRESSING / WOUND CARE / SHOWERING  Keep the surgical dressing until follow up.  The dressing is water proof, so you can shower without any extra covering.  IF THE DRESSING FALLS OFF or the wound gets wet inside, change the dressing with sterile gauze.  Please use good hand washing techniques before changing the dressing.  Do not use any lotions or creams on the incision until instructed by your surgeon.    ACTIVITY  o Increase activity slowly as tolerated, but follow the weight bearing instructions below.   o No driving for 6 weeks or until further direction given by your physician.  You cannot drive while taking narcotics.  o No lifting or carrying greater than 10 lbs. until further directed by your surgeon. o Avoid periods of inactivity such as sitting longer than an hour when not asleep. This helps prevent blood clots.  o You may return to work once you are authorized by your doctor.     WEIGHT BEARING   Weight bearing as tolerated with assist device (walker, cane, etc) as directed, use it  as long as suggested by your surgeon or therapist, typically at least 4-6 weeks.   EXERCISES  Results after joint replacement surgery are often greatly improved when you follow the exercise, range of motion and muscle strengthening exercises prescribed by your doctor. Safety measures are also important to protect the joint from further injury. Any time any of these exercises cause you to have increased pain or swelling, decrease what you are doing until you are comfortable again and then slowly increase them. If you have problems or questions, call your caregiver or physical therapist for advice.   Rehabilitation is important following a joint replacement. After just a few days of immobilization, the muscles of the leg can become weakened and shrink (atrophy).  These exercises are designed to build up the tone and strength of the thigh and leg muscles and to improve motion. Often times heat used for twenty to thirty minutes before working out will loosen up your tissues and help with improving the range of motion but do not use heat for the first two weeks following surgery (sometimes heat can increase post-operative swelling).   These exercises can be done on a training (exercise) mat, on the floor, on a table or on a bed. Use whatever works the best and is most comfortable for you.    Use music or television while you are exercising so that the exercises are a pleasant break  in your day. This will make your life better with the exercises acting as a break in your routine that you can look forward to.   Perform all exercises about fifteen times, three times per day or as directed.  You should exercise both the operative leg and the other leg as well.  Exercises include:    Quad Sets - Tighten up the muscle on the front of the thigh (Quad) and hold for 5-10 seconds.    Straight Leg Raises - With your knee straight (if you were given a brace, keep it on), lift the leg to 60 degrees, hold for 3  seconds, and slowly lower the leg.  Perform this exercise against resistance later as your leg gets stronger.   Leg Slides: Lying on your back, slowly slide your foot toward your buttocks, bending your knee up off the floor (only go as far as is comfortable). Then slowly slide your foot back down until your leg is flat on the floor again.   Angel Wings: Lying on your back spread your legs to the side as far apart as you can without causing discomfort.   Hamstring Strength:  Lying on your back, push your heel against the floor with your leg straight by tightening up the muscles of your buttocks.  Repeat, but this time bend your knee to a comfortable angle, and push your heel against the floor.  You may put a pillow under the heel to make it more comfortable if necessary.   A rehabilitation program following joint replacement surgery can speed recovery and prevent re-injury in the future due to weakened muscles. Contact your doctor or a physical therapist for more information on knee rehabilitation.    CONSTIPATION  Constipation is defined medically as fewer than three stools per week and severe constipation as less than one stool per week.  Even if you have a regular bowel pattern at home, your normal regimen is likely to be disrupted due to multiple reasons following surgery.  Combination of anesthesia, postoperative narcotics, change in appetite and fluid intake all can affect your bowels.   YOU MUST use at least one of the following options; they are listed in order of increasing strength to get the job done.  They are all available over the counter, and you may need to use some, POSSIBLY even all of these options:    Drink plenty of fluids (prune juice may be helpful) and high fiber foods Colace 100 mg by mouth twice a day  Senokot for constipation as directed and as needed Dulcolax (bisacodyl), take with full glass of water  Miralax (polyethylene glycol) once or twice a day as needed.  If  you have tried all these things and are unable to have a bowel movement in the first 3-4 days after surgery call either your surgeon or your primary doctor.    If you experience loose stools or diarrhea, hold the medications until you stool forms back up.  If your symptoms do not get better within 1 week or if they get worse, check with your doctor.  If you experience "the worst abdominal pain ever" or develop nausea or vomiting, please contact the office immediately for further recommendations for treatment.   ITCHING:  If you experience itching with your medications, try taking only a single pain pill, or even half a pain pill at a time.  You can also use Benadryl over the counter for itching or also to help with sleep.   TED  HOSE STOCKINGS:  Use stockings on both legs until for at least 2 weeks or as directed by physician office. They may be removed at night for sleeping.  MEDICATIONS:  See your medication summary on the After Visit Summary that nursing will review with you.  You may have some home medications which will be placed on hold until you complete the course of blood thinner medication.  It is important for you to complete the blood thinner medication as prescribed.  PRECAUTIONS:  If you experience chest pain or shortness of breath - call 911 immediately for transfer to the hospital emergency department.   If you develop a fever greater that 101 F, purulent drainage from wound, increased redness or drainage from wound, foul odor from the wound/dressing, or calf pain - CONTACT YOUR SURGEON.                                                   FOLLOW-UP APPOINTMENTS:  If you do not already have a post-op appointment, please call the office for an appointment to be seen by your surgeon.  Guidelines for how soon to be seen are listed in your After Visit Summary, but are typically between 1-4 weeks after surgery.  OTHER INSTRUCTIONS:   Knee Replacement:  Do not place pillow under knee,  focus on keeping the knee straight while resting.   MAKE SURE YOU:   Understand these instructions.   Get help right away if you are not doing well or get worse.    Thank you for letting us be a part of your medical care team.  It is a privilege we respect greatly.  We hope these instructions will help you stay on track for a fast and full recovery!

## 2017-07-03 NOTE — Anesthesia Preprocedure Evaluation (Signed)
Anesthesia Evaluation  Patient identified by MRN, date of birth, ID band Patient awake    Reviewed: Allergy & Precautions, NPO status , Patient's Chart, lab work & pertinent test results  Airway Mallampati: II  TM Distance: >3 FB Neck ROM: Full    Dental no notable dental hx.    Pulmonary  ILD (interstitial lung disease)   Pulmonary exam normal breath sounds clear to auscultation       Cardiovascular hypertension, Pt. on medications Normal cardiovascular exam+ dysrhythmias Atrial Fibrillation  Rhythm:Regular Rate:Normal     Neuro/Psych Spinal stenosis negative psych ROS   GI/Hepatic Neg liver ROS, GERD  ,  Endo/Other  negative endocrine ROS  Renal/GU negative Renal ROS  negative genitourinary   Musculoskeletal negative musculoskeletal ROS (+)   Abdominal   Peds negative pediatric ROS (+)  Hematology negative hematology ROS (+)   Anesthesia Other Findings   Reproductive/Obstetrics negative OB ROS                             Anesthesia Physical Anesthesia Plan  ASA: III  Anesthesia Plan: General   Post-op Pain Management:    Induction: Intravenous  PONV Risk Score and Plan: 3 and Ondansetron  Airway Management Planned: Oral ETT  Additional Equipment:   Intra-op Plan:   Post-operative Plan: Extubation in OR  Informed Consent: I have reviewed the patients History and Physical, chart, labs and discussed the procedure including the risks, benefits and alternatives for the proposed anesthesia with the patient or authorized representative who has indicated his/her understanding and acceptance.   Dental advisory given  Plan Discussed with: CRNA  Anesthesia Plan Comments: (Spinal stenosis, cannot do SAB.)        Anesthesia Quick Evaluation

## 2017-07-03 NOTE — Anesthesia Procedure Notes (Signed)
Procedure Name: Intubation Date/Time: 07/03/2017 8:38 AM Performed by: Claudia Desanctis, CRNA Pre-anesthesia Checklist: Patient identified, Emergency Drugs available, Suction available and Patient being monitored Patient Re-evaluated:Patient Re-evaluated prior to induction Oxygen Delivery Method: Circle system utilized Preoxygenation: Pre-oxygenation with 100% oxygen Induction Type: IV induction Ventilation: Mask ventilation without difficulty Laryngoscope Size: 2 and Miller Grade View: Grade I Tube type: Oral Tube size: 7.0 mm Number of attempts: 1 Airway Equipment and Method: Stylet Placement Confirmation: ETT inserted through vocal cords under direct vision,  positive ETCO2 and breath sounds checked- equal and bilateral Secured at: 21 cm Tube secured with: Tape Dental Injury: Teeth and Oropharynx as per pre-operative assessment

## 2017-07-03 NOTE — Op Note (Signed)
NAME:  Karina Tran                ACCOUNT NO.: [id number]      MEDICAL RECORD NO.: [id number]      FACILITY:  [facility]      PHYSICIAN:  [name]  DATE OF BIRTH:  1940/05/20     DATE OF PROCEDURE:  07/03/2017                                 OPERATIVE REPORT         PREOPERATIVE DIAGNOSIS: Left  hip osteoarthritis.      POSTOPERATIVE DIAGNOSIS:  Left hip osteoarthritis.      PROCEDURE:  Left total hip replacement through an anterior approach   utilizing DePuy THR system, component size 95mm pinnacle cup, a size 36+4 neutral   Altrex liner, a size 6 Hi Tri Lock stem with a 36+5 delta ceramic   ball.      SURGEON:  Pietro Cassis. Alvan Dame, M.D.      ASSISTANT:  Nehemiah Massed, PA-C     ANESTHESIA:  Spinal.      SPECIMENS:  None.      COMPLICATIONS:  None.      BLOOD LOSS:  150 cc     DRAINS:  None.      INDICATION OF THE PROCEDURE:  Karina Tran is a 77 y.o. female who had   presented to office for evaluation of left hip pain.  Radiographs revealed   progressive degenerative changes with bone-on-bone   articulation to the  hip joint.  The patient had painful limited range of   motion significantly affecting their overall quality of life.  The patient was failing to    respond to conservative measures, and at this point was ready   to proceed with more definitive measures.  The patient has noted progressive   degenerative changes in his hip, progressive problems and dysfunction   with regarding the hip prior to surgery.  Consent was obtained for   benefit of pain relief.  Specific risk of infection, DVT, component   failure, dislocation, need for revision surgery, as well discussion of   the anterior versus posterior approach were reviewed.  Consent was   obtained for benefit of anterior pain relief through an anterior   approach.      PROCEDURE IN DETAIL:  The patient was brought to operative theater.   Once adequate anesthesia,  preoperative antibiotics, 2 gm of Ancef, 1 gm of Tranexamic Acid, and 10 mg of Decadron administered.   The patient was positioned supine on the OSI Hanna table.  Once adequate   padding of boney process was carried out, we had predraped out the hip, and  used fluoroscopy to confirm orientation of the pelvis and position.      The left hip was then prepped and draped from proximal iliac crest to   mid thigh with shower curtain technique.      Time-out was performed identifying the patient, planned procedure, and   extremity.     An incision was then made 2 cm distal and lateral to the   anterior superior iliac spine extending over the orientation of the   tensor fascia lata muscle and sharp dissection was carried down to the   fascia of the muscle and protractor placed in the soft tissues.      The  fascia was then incised.  The muscle belly was identified and swept   laterally and retractor placed along the superior neck.  Following   cauterization of the circumflex vessels and removing some pericapsular   fat, a second cobra retractor was placed on the inferior neck.  A third   retractor was placed on the anterior acetabulum after elevating the   anterior rectus.  A L-capsulotomy was along the line of the   superior neck to the trochanteric fossa, then extended proximally and   distally.  Tag sutures were placed and the retractors were then placed   intracapsular.  We then identified the trochanteric fossa and   orientation of my neck cut, confirmed this radiographically   and then made a neck osteotomy with the femur on traction.  The femoral   head was removed without difficulty or complication.  Traction was let   off and retractors were placed posterior and anterior around the   acetabulum.      The labrum and foveal tissue were debrided.  I began reaming with a 62mm   reamer and reamed up to 56mm reamer with good bony bed preparation and a 40mm   cup was chosen.  The final 67mm  Pinnacle cup was then impacted under fluoroscopy  to confirm the depth of penetration and orientation with respect to   abduction.  A screw was placed followed by the hole eliminator.  The final   36+4 neutral Altrex liner was impacted with good visualized rim fit.  The cup was positioned anatomically within the acetabular portion of the pelvis.      At this point, the femur was rolled at 80 degrees.  Further capsule was   released off the inferior aspect of the femoral neck.  I then   released the superior capsule proximally.  The hook was placed laterally   along the femur and elevated manually and held in position with the bed   hook.  The leg was then extended and adducted with the leg rolled to 100   degrees of external rotation.  Once the proximal femur was fully   exposed, I used a box osteotome to set orientation.  I then began   broaching with the starting chili pepper broach and passed this by hand and then broached up to 6.  With the 6 broach in place I chose a high offset neck and did several trial reductions.  The offset was appropriate, leg lengths   appeared to be equal best matched to the other (arthritic) hip with the +5 head ball confirmed radiographically.   Given these findings, I went ahead and dislocated the hip, repositioned all   retractors and positioned the right hip in the extended and abducted position.  The final 6 Hi Tri Lock stem was   chosen and it was impacted down to the level of neck cut.  Based on this   and the trial reduction, a 36+5 delta ceramic ball was chosen and   impacted onto a clean and dry trunnion, and the hip was reduced.  The   hip had been irrigated throughout the case again at this point.  I did   reapproximate the superior capsular leaflet to the anterior leaflet   using #1 Vicryl.  The fascia of the   tensor fascia lata muscle was then reapproximated using #1 Vicryl and #0 Stratafix sutures.  The   remaining wound was closed with 2-0 Vicryl  and running 4-0 Monocryl.  The hip was cleaned, dried, and dressed sterilely using Dermabond and   Aquacel dressing.  She was then brought   to recovery room in stable condition tolerating the procedure well.    Nehemiah Massed, PA-C was present for the entirety of the case involved from   preoperative positioning, perioperative retractor management, general   facilitation of the case, as well as primary wound closure as assistant.            Pietro Cassis Alvan Dame, M.D.        07/03/2017 9:56 AM

## 2017-07-03 NOTE — Evaluation (Signed)
Physical Therapy Evaluation Patient Details Name: Karina Tran MRN: [id number] DOB: 1940-05-23 Today's Date: 07/03/2017   History of Present Illness  L DA-THA; PMH Raynauds, interstitial lung disease, spinal stenosis, afib, breast cancer  Clinical Impression  Pt is s/p THA resulting in the deficits listed below (see PT Problem List). Pt ambulated 8' with RW, distance limited by pain/nausea. Good progress expected.  Pt will benefit from skilled PT to increase their independence and safety with mobility to allow discharge to the venue listed below.      Follow Up Recommendations Follow surgeon's recommendation for DC plan and follow-up therapies    Equipment Recommendations  Rolling walker with 5" wheels    Recommendations for Other Services       Precautions / Restrictions Precautions Precautions: Fall Restrictions Weight Bearing Restrictions: No Other Position/Activity Restrictions: WBAT      Mobility  Bed Mobility Overal bed mobility: Needs Assistance Bed Mobility: Supine to Sit     Supine to sit: Mod assist     General bed mobility comments: assist to raise trunk and advance BLEs; dizzy in sitting at edge of bed, BP 92/66, RN notified  Transfers Overall transfer level: Needs assistance Equipment used: Rolling walker (2 wheeled) Transfers: Sit to/from Stand Sit to Stand: Mod assist         General transfer comment: assist to rise/steady  Ambulation/Gait Ambulation/Gait assistance: Min assist Ambulation Distance (Feet): 8 Feet Assistive device: Rolling walker (2 wheeled) Gait Pattern/deviations: Step-to pattern;Decreased weight shift to left   Gait velocity interpretation: Below normal speed for age/gender General Gait Details: distance limited by pain, VCs sequencing  Stairs            Wheelchair Mobility    Modified Rankin (Stroke Patients Only)       Balance Overall balance assessment: Needs assistance   Sitting balance-Leahy Scale:  Good     Standing balance support: Bilateral upper extremity supported Standing balance-Leahy Scale: Poor Standing balance comment: relies on BUE support                             Pertinent Vitals/Pain Pain Assessment: 0-10 Pain Score: 7  Pain Location: L hip with movement Pain Descriptors / Indicators: Sore Pain Intervention(s): Limited activity within patient's tolerance;Monitored during session;Premedicated before session;Ice applied    Home Living Family/patient expects to be discharged to:: Private residence Living Arrangements: Children Available Help at Discharge: Family;Available PRN/intermittently   Home Access: Stairs to enter Entrance Stairs-Rails: Right Entrance Stairs-Number of Steps: 4 Home Layout: One level Home Equipment: Walker - 2 wheels;Cane - single point;Bedside commode;Shower seat - built in;Hand held shower head;Grab bars - tub/shower      Prior Function Level of Independence: Independent with assistive device(s)               Hand Dominance        Extremity/Trunk Assessment   Upper Extremity Assessment Upper Extremity Assessment: Overall WFL for tasks assessed    Lower Extremity Assessment Lower Extremity Assessment: LLE deficits/detail LLE Deficits / Details: pain with movement of L hip, ankle WNL LLE: Unable to fully assess due to pain    Cervical / Trunk Assessment Cervical / Trunk Assessment: Kyphotic  Communication   Communication: No difficulties  Cognition Arousal/Alertness: Awake/alert Behavior During Therapy: WFL for tasks assessed/performed Overall Cognitive Status: Within Functional Limits for tasks assessed  General Comments      Exercises Total Joint Exercises Ankle Circles/Pumps: AROM;Both;10 reps;Supine Heel Slides: AAROM;Left;10 reps;Supine   Assessment/Plan    PT Assessment Patient needs continued PT services  PT Problem List Decreased  strength;Decreased range of motion;Decreased activity tolerance;Decreased mobility;Pain       PT Treatment Interventions Gait training;DME instruction;Functional mobility training;Therapeutic activities;Stair training;Therapeutic exercise;Balance training;Patient/family education    PT Goals (Current goals can be found in the Care Plan section)  Acute Rehab PT Goals Patient Stated Goal: to walk PT Goal Formulation: With patient Time For Goal Achievement: 07/10/17 Potential to Achieve Goals: Good    Frequency 7X/week   Barriers to discharge        Co-evaluation               AM-PAC PT "6 Clicks" Daily Activity  Outcome Measure Difficulty turning over in bed (including adjusting bedclothes, sheets and blankets)?: A Lot Difficulty moving from lying on back to sitting on the side of the bed? : Unable Difficulty sitting down on and standing up from a chair with arms (e.g., wheelchair, bedside commode, etc,.)?: Unable Help needed moving to and from a bed to chair (including a wheelchair)?: A Lot Help needed walking in hospital room?: A Lot Help needed climbing 3-5 steps with a railing? : Total 6 Click Score: 9    End of Session Equipment Utilized During Treatment: Gait belt Activity Tolerance: Patient limited by pain Patient left: in chair;with call bell/phone within reach Nurse Communication: Mobility status; pt nauseous PT Visit Diagnosis: Unsteadiness on feet (R26.81);Muscle weakness (generalized) (M62.81);Difficulty in walking, not elsewhere classified (R26.2);Pain Pain - Right/Left: Left Pain - part of body: Hip    Time: 1594-5859 PT Time Calculation (min) (ACUTE ONLY): 25 min   Charges:   PT Evaluation $PT Eval Low Complexity: 1 Low PT Treatments $Gait Training: 8-22 mins   PT G Codes:          Philomena Doheny 07/03/2017, 3:32 PM [phone]

## 2017-07-03 NOTE — Interval H&P Note (Signed)
History and Physical Interval Note:  07/03/2017 7:01 AM  Karina Tran  has presented today for surgery, with the diagnosis of Left hip osteoarthritis  The various methods of treatment have been discussed with the patient and family. After consideration of risks, benefits and other options for treatment, the patient has consented to  Procedure(s) with comments: LEFT TOTAL HIP ARTHROPLASTY ANTERIOR APPROACH (Left) - 70 mins as a surgical intervention .  The patient's history has been reviewed, patient examined, no change in status, stable for surgery.  I have reviewed the patient's chart and labs.  Questions were answered to the patient's satisfaction.     [name]

## 2017-07-04 DIAGNOSIS — E663 Overweight: Secondary | ICD-10-CM | POA: Diagnosis present

## 2017-07-04 LAB — BASIC METABOLIC PANEL
ANION GAP: 9 (ref 5–15)
BUN: 9 mg/dL (ref 6–20)
CO2: 30 mmol/L (ref 22–32)
CREATININE: 0.64 mg/dL (ref 0.44–1.00)
Calcium: 8.9 mg/dL (ref 8.9–10.3)
Chloride: 97 mmol/L — ABNORMAL LOW (ref 101–111)
GFR calc Af Amer: >60 mL/min (ref >60)
Glucose, Bld: 124 mg/dL — ABNORMAL HIGH (ref 65–99)
Potassium: 3.2 mmol/L — ABNORMAL LOW (ref 3.5–5.1)
SODIUM: 136 mmol/L (ref 135–145)

## 2017-07-04 LAB — CBC
HEMATOCRIT: 38.6 % (ref 36.0–46.0)
Hemoglobin: 12.6 g/dL (ref 12.0–15.0)
MCH: 29.6 pg (ref 26.0–34.0)
MCHC: 32.6 g/dL (ref 30.0–36.0)
MCV: 90.6 fL (ref 78.0–100.0)
PLATELETS: 280 10*3/uL (ref 150–400)
RBC: 4.26 MIL/uL (ref 3.87–5.11)
RDW: 13.6 % (ref 11.5–15.5)
WBC: 15 10*3/uL — AB (ref 4.0–10.5)

## 2017-07-04 MED ORDER — POTASSIUM CHLORIDE 20 MEQ PO PACK
40.0000 meq | PACK | Freq: Once | ORAL | Status: AC
  Administered 2017-07-04: 09:00:00 40 meq via ORAL
  Filled 2017-07-04: qty 2

## 2017-07-04 NOTE — Progress Notes (Signed)
Reviewed discharge instructions, medications and prescriptions given. Patient stated having no questions. Will continue to monitor until family arrives.

## 2017-07-04 NOTE — Progress Notes (Signed)
Physical Therapy Treatment Patient Details Name: Karina Tran MRN: [id number] DOB: 11-06-1940 Today's Date: 07/04/2017    History of Present Illness L DA-THA; PMH Raynauds, interstitial lung disease, spinal stenosis, afib, breast cancer    PT Comments    Stair training completed, pt ambulated 31' with RW, distance limited by pain.    Follow Up Recommendations  Follow surgeon's recommendation for DC plan and follow-up therapies     Equipment Recommendations  Rolling walker with 5" wheels    Recommendations for Other Services       Precautions / Restrictions Precautions Precautions: Fall Restrictions Weight Bearing Restrictions: No Other Position/Activity Restrictions: WBAT    Mobility  Bed Mobility Overal bed mobility: Modified Independent Bed Mobility: Supine to Sit     Supine to sit: Modified independent (Device/Increase time)     General bed mobility comments: up in recliner  Transfers Overall transfer level: Needs assistance Equipment used: Rolling walker (2 wheeled) Transfers: Sit to/from Stand Sit to Stand: Supervision         General transfer comment: no assist needed  Ambulation/Gait Ambulation/Gait assistance: Supervision Ambulation Distance (Feet): 45 Feet Assistive device: Rolling walker (2 wheeled) Gait Pattern/deviations: Step-to pattern;Decreased weight shift to left;Trunk flexed   Gait velocity interpretation: Below normal speed for age/gender General Gait Details: distance limited by pain, trunk flexed (pt reports this is baseline)   Stairs Stairs: Yes   Stair Management: Two rails;Step to pattern;Forwards Number of Stairs: 3 General stair comments: VCs for sequencing  Wheelchair Mobility    Modified Rankin (Stroke Patients Only)       Balance Overall balance assessment: Needs assistance   Sitting balance-Leahy Scale: Good     Standing balance support: Bilateral upper extremity supported Standing balance-Leahy  Scale: Poor Standing balance comment: relies on BUE support                            Cognition Arousal/Alertness: Awake/alert Behavior During Therapy: WFL for tasks assessed/performed Overall Cognitive Status: Within Functional Limits for tasks assessed                                        Exercises Total Joint Exercises Ankle Circles/Pumps: AROM;Both;10 reps;Supine Quad Sets: AROM;Both;5 reps;Supine Short Arc Quad: AROM;Left;10 reps;Supine Heel Slides: AAROM;Left;10 reps;Supine Hip ABduction/ADduction: AAROM;Left;10 reps;Supine(also 5 reps LLE in standing) Long Arc Quad: AROM;Left;10 reps;Seated Knee Flexion: AROM;Left;5 reps;Standing Marching in Standing: AROM;Left;5 reps;Standing Standing Hip Extension: AROM;Left;5 reps;Standing    General Comments        Pertinent Vitals/Pain Pain Score: 6  Pain Location: L hip with movement Pain Descriptors / Indicators: Sore Pain Intervention(s): Limited activity within patient's tolerance;Monitored during session;Premedicated before session;Ice applied    Home Living                      Prior Function            PT Goals (current goals can now be found in the care plan section) Acute Rehab PT Goals Patient Stated Goal: to walk PT Goal Formulation: With patient Time For Goal Achievement: 07/10/17 Potential to Achieve Goals: Good Progress towards PT goals: Progressing toward goals    Frequency    7X/week      PT Plan Current plan remains appropriate    Co-evaluation  AM-PAC PT "6 Clicks" Daily Activity  Outcome Measure  Difficulty turning over in bed (including adjusting bedclothes, sheets and blankets)?: A Little Difficulty moving from lying on back to sitting on the side of the bed? : A Little Difficulty sitting down on and standing up from a chair with arms (e.g., wheelchair, bedside commode, etc,.)?: A Little Help needed moving to and from a bed to  chair (including a wheelchair)?: None Help needed walking in hospital room?: A Little Help needed climbing 3-5 steps with a railing? : A Little 6 Click Score: 19    End of Session Equipment Utilized During Treatment: Gait belt Activity Tolerance: Patient limited by pain Patient left: in chair;with call bell/phone within reach Nurse Communication: Mobility status PT Visit Diagnosis: Unsteadiness on feet (R26.81);Muscle weakness (generalized) (M62.81);Difficulty in walking, not elsewhere classified (R26.2);Pain Pain - Right/Left: Left Pain - part of body: Hip     Time: 1152-1206 PT Time Calculation (min) (ACUTE ONLY): 14 min  Charges:  $Gait Training: 8-22 mins                    G Codes:          Philomena Doheny 07/04/2017, 12:55 PM [phone]

## 2017-07-04 NOTE — Progress Notes (Signed)
     Subjective: 1 Day Post-Op Procedure(s) (LRB): LEFT TOTAL HIP ARTHROPLASTY ANTERIOR APPROACH (Left)   Patient reports pain as mild, pain controlled. No events throughout the night.  Labs this morning indicated low potassium. Dr. Alvan Dame discussed with the patient about giving her some potassium this morning. Doing well with PT.  Ready to be discharged home.  Objective:   VITALS:   Vitals:   07/04/17 0033 07/04/17 0529  BP: 124/64 126/69  Pulse: 78 81  Resp: 17 17  Temp: 97.9 F (36.6 C) 98.4 F (36.9 C)  SpO2: 97% 98%    Dorsiflexion/Plantar flexion intact Incision: dressing C/D/I No cellulitis present Compartment soft  LABS Recent Labs    07/04/17 0558  HGB 12.6  HCT 38.6  WBC 15.0*  PLT 280    Recent Labs    07/04/17 0558  NA 136  K 3.2*  BUN 9  CREATININE 0.64  GLUCOSE 124*     Assessment/Plan: 1 Day Post-Op Procedure(s) (LRB): LEFT TOTAL HIP ARTHROPLASTY ANTERIOR APPROACH (Left) Foley cath d/c'ed Advance diet Up with therapy D/C IV fluids Discharge home Follow up in 2 weeks at [facility]. Follow up with OLIN,Edwyn Inclan D in 2 weeks.  Contact information:  [facility] [address], [place] [phone]    Overweight (BMI 25-29.9) Estimated body mass index is 27.66 kg/m as calculated from the following:   Height as of this encounter: 5\' 4"  (1.626 m).   Weight as of this encounter: 73.1 kg (161 lb 2.5 oz). Patient also counseled that weight may inhibit the healing process Patient counseled that losing weight will help with future health issues      West Pugh. Mirka Barbone   PAC  07/04/2017, 8:10 AM

## 2017-07-04 NOTE — Progress Notes (Signed)
Physical Therapy Treatment Patient Details Name: Karina Tran MRN: [id number] DOB: 20-Aug-1940 Today's Date: 07/04/2017    History of Present Illness L DA-THA; PMH Raynauds, interstitial lung disease, spinal stenosis, afib, breast cancer    PT Comments    Instructed pt in HEP, she demonstrates good understanding. She ambulated 8' with RW then couldn't continue 2* pain. Pain meds requested. Will return later today to complete gait training and stair training.   Follow Up Recommendations  Follow surgeon's recommendation for DC plan and follow-up therapies     Equipment Recommendations  Rolling walker with 5" wheels    Recommendations for Other Services       Precautions / Restrictions Precautions Precautions: Fall Restrictions Weight Bearing Restrictions: No Other Position/Activity Restrictions: WBAT    Mobility  Bed Mobility Overal bed mobility: Modified Independent Bed Mobility: Supine to Sit     Supine to sit: Modified independent (Device/Increase time)     General bed mobility comments: HOB up  Transfers Overall transfer level: Needs assistance Equipment used: Rolling walker (2 wheeled) Transfers: Sit to/from Stand Sit to Stand: Min guard         General transfer comment: no assist needed, min/guard safety  Ambulation/Gait Ambulation/Gait assistance: Min assist Ambulation Distance (Feet): 8 Feet Assistive device: Rolling walker (2 wheeled) Gait Pattern/deviations: Step-to pattern;Decreased weight shift to left   Gait velocity interpretation: Below normal speed for age/gender General Gait Details: distance limited by pain, VCs sequencing   Stairs            Wheelchair Mobility    Modified Rankin (Stroke Patients Only)       Balance Overall balance assessment: Needs assistance   Sitting balance-Leahy Scale: Good     Standing balance support: Bilateral upper extremity supported Standing balance-Leahy Scale: Poor Standing balance  comment: relies on BUE support                            Cognition Arousal/Alertness: Awake/alert Behavior During Therapy: WFL for tasks assessed/performed Overall Cognitive Status: Within Functional Limits for tasks assessed                                        Exercises Total Joint Exercises Ankle Circles/Pumps: AROM;Both;10 reps;Supine Quad Sets: AROM;Both;5 reps;Supine Short Arc Quad: AROM;Left;10 reps;Supine Heel Slides: AAROM;Left;10 reps;Supine Hip ABduction/ADduction: AAROM;Left;10 reps;Supine(also 5 reps LLE in standing) Long Arc Quad: AROM;Left;10 reps;Seated Knee Flexion: AROM;Left;5 reps;Standing Marching in Standing: AROM;Left;5 reps;Standing Standing Hip Extension: AROM;Left;5 reps;Standing    General Comments        Pertinent Vitals/Pain Pain Score: 6  Pain Location: L hip with movement Pain Descriptors / Indicators: Sore Pain Intervention(s): Limited activity within patient's tolerance;Monitored during session;Patient requesting pain meds-RN notified;Ice applied    Home Living                      Prior Function            PT Goals (current goals can now be found in the care plan section) Acute Rehab PT Goals Patient Stated Goal: to walk PT Goal Formulation: With patient Time For Goal Achievement: 07/10/17 Potential to Achieve Goals: Good Progress towards PT goals: Progressing toward goals    Frequency    7X/week      PT Plan Current plan remains appropriate    Co-evaluation  AM-PAC PT "6 Clicks" Daily Activity  Outcome Measure  Difficulty turning over in bed (including adjusting bedclothes, sheets and blankets)?: A Little Difficulty moving from lying on back to sitting on the side of the bed? : A Little Difficulty sitting down on and standing up from a chair with arms (e.g., wheelchair, bedside commode, etc,.)?: A Little Help needed moving to and from a bed to chair (including a  wheelchair)?: A Little Help needed walking in hospital room?: A Little Help needed climbing 3-5 steps with a railing? : A Lot 6 Click Score: 17    End of Session Equipment Utilized During Treatment: Gait belt Activity Tolerance: Patient limited by pain Patient left: in chair;with call bell/phone within reach Nurse Communication: Mobility status;Patient requests pain meds PT Visit Diagnosis: Unsteadiness on feet (R26.81);Muscle weakness (generalized) (M62.81);Difficulty in walking, not elsewhere classified (R26.2);Pain Pain - Right/Left: Left Pain - part of body: Hip     Time: 5110-2111 PT Time Calculation (min) (ACUTE ONLY): 19 min  Charges:  $Gait Training: 8-22 mins                    G Codes:          Philomena Doheny 07/04/2017, 11:47 AM [phone]

## 2017-07-09 NOTE — Discharge Summary (Signed)
Physician Discharge Summary  Patient ID: Daneka Lantigua MRN: [id number] DOB/AGE: 1940/05/15 77 y.o.  Admit date: 07/03/2017 Discharge date: 07/04/2017   Procedures:  Procedure(s) (LRB): LEFT TOTAL HIP ARTHROPLASTY ANTERIOR APPROACH (Left)  Attending Physician:  Dr. Paralee Cancel   Admission Diagnoses:   Left hip primary OA / pain  Discharge Diagnoses:  Principal Problem:   S/P left THA, AA Active Problems:   S/P hip replacement   Overweight (BMI 25.0-29.9)  Past Medical History:  Diagnosis Date  . Allergic rhinitis   . Breast cancer ([place])       . Dyspnea    chronic with exertion   . GERD (gastroesophageal reflux disease)   . 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 History of GI bleed    last episode 02-02-16; was due to hemorroids   . History of kidney infection   . History of pulmonary hypertension    mild ; (see ECHO result in LOV with Dr. Kela Millin 06-18-17 on chart )  . HLD (hyperlipidemia)    denies   . Hypertension   . ILD (interstitial lung disease) ([place])   . Iron deficiency anemia    gets iv infusions  . Persistent atrial fibrillation ([place])    a. s/p PVI 2009 in [place]  . Raynaud phenomenon    no issues currently   . Spinal stenosis    lumbar     HPI:    Karina Tran, 77 y.o. female, has a history of pain and functional disability in the left hip(s) due to arthritis and patient has failed non-surgical conservative treatments for greater than 12 weeks to include NSAID's and/or analgesics, use of assistive devices and activity modification.  Onset of symptoms was gradual starting 2+ years ago with gradually worsening course since that time.The patient noted no past surgery on the left hip(s).  Patient currently rates pain in the left hip at 7 out of 10 with activity. Patient has night pain, worsening of pain with activity and weight bearing, trendelenberg gait, pain that interfers with activities of daily living and pain with passive range of motion. Patient has  evidence of periarticular osteophytes and joint space narrowing by imaging studies. This condition presents safety issues increasing the risk of falls.  There is no current active infection.  Risks, benefits and expectations were discussed with the patient.  Risks including but not limited to the risk of anesthesia, blood clots, nerve damage, blood vessel damage, failure of the prosthesis, infection and up to and including death.  Patient understand the risks, benefits and expectations and wishes to proceed with surgery.  PCP: Curly Rim, MD   Discharged Condition: good  Hospital Course:  Patient underwent the above stated procedure on 07/03/2017. Patient tolerated the procedure well and brought to the recovery room in good condition and subsequently to the floor.  POD #1 BP: 126/69 ; Pulse: 81 ; Temp: 98.4 F (36.9 C) ; Resp: 17 Patient reports pain as mild, pain controlled. No events throughout the night.  Labs this morning indicated low potassium. Dr. Alvan Dame discussed with the patient about giving her some potassium this morning. Doing well with PT.  Ready to be discharged home. Neurovascular intact and incision: dressing C/D/I.   LABS  Basename    HGB     12.6  HCT     38.6    Discharge Exam: General appearance: alert, cooperative and no distress Extremities: Homans sign is negative, no sign of DVT, no edema, redness or tenderness  in the calves or thighs and no ulcers, gangrene or trophic changes  Disposition:  Home with follow up in 2 weeks   Follow-up Information    Paralee Cancel, MD. Schedule an appointment as soon as possible for a visit in 2 weeks.   Specialty:  Orthopedic Surgery Contact information: [address] [place] [address] [phone]           Discharge Instructions    Call MD / Call 911   Complete by:  As directed    If you experience chest pain or shortness of breath, CALL 911 and be transported to the hospital emergency room.   If you develope a fever above 101 F, pus (white drainage) or increased drainage or redness at the wound, or calf pain, call your surgeon's office.   Change dressing   Complete by:  As directed    Maintain surgical dressing until follow up in the clinic. If the edges start to pull up, may reinforce with tape. If the dressing is no longer working, may remove and cover with gauze and tape, but must keep the area dry and clean.  Call with any questions or concerns.   Constipation Prevention   Complete by:  As directed    Drink plenty of fluids.  Prune juice may be helpful.  You may use a stool softener, such as Colace (over the counter) 100 mg twice a day.  Use MiraLax (over the counter) for constipation as needed.   Diet - low sodium heart healthy   Complete by:  As directed    Discharge instructions   Complete by:  As directed    Maintain surgical dressing until follow up in the clinic. If the edges start to pull up, may reinforce with tape. If the dressing is no longer working, may remove and cover with gauze and tape, but must keep the area dry and clean.  Follow up in 2 weeks at [facility]. Call with any questions or concerns.   Increase activity slowly as tolerated   Complete by:  As directed    Weight bearing as tolerated with assist device (walker, cane, etc) as directed, use it as long as suggested by your surgeon or therapist, typically at least 4-6 weeks.   TED hose   Complete by:  As directed    Use stockings (TED hose) for 2 weeks on both leg(s).  You may remove them at night for sleeping.      Allergies as of 07/04/2017      Reactions   Aspirin    Bloody noses   Blue Dyes (parenteral) Nausea And Vomiting   Demerol [meperidine]    vomiting      Medication List    STOP taking these medications   diclofenac sodium 1 % Gel Commonly known as:  VOLTAREN   Potassium Gluconate 550 MG Tabs     TAKE these medications   amLODipine 2.5 MG tablet Commonly known as:   NORVASC Take 2.5 mg by mouth daily.   atenolol 50 MG tablet Commonly known as:  TENORMIN Take 50 mg by mouth daily.   CALCIUM 600+D3 PO Take 1 tablet by mouth daily.   Co Q10 100 MG Caps Take 100 mg by mouth daily.   docusate sodium 100 MG capsule Commonly known as:  COLACE Take 1 capsule (100 mg total) by mouth 2 (two) times daily.   ferrous sulfate 325 (65 FE) MG tablet Commonly known as:  FERROUSUL Take 1 tablet (  325 mg total) by mouth 3 (three) times daily with meals.   flecainide 50 MG tablet Commonly known as:  TAMBOCOR Take 1 tablet (50 mg total) by mouth every 12 (twelve) hours. What changed:  when to take this   gabapentin 300 MG capsule Commonly known as:  NEURONTIN Take 600 mg by mouth at bedtime.   hydrochlorothiazide 25 MG tablet Commonly known as:  HYDRODIURIL Take 25 mg by mouth daily.   HYDROcodone-acetaminophen 7.5-325 MG tablet Commonly known as:  NORCO Take 1-2 tablets by mouth every 4 (four) hours as needed for moderate pain.   methocarbamol 500 MG tablet Commonly known as:  ROBAXIN Take 1 tablet (500 mg total) by mouth every 6 (six) hours as needed for muscle spasms.   pantoprazole 40 MG tablet Commonly known as:  PROTONIX Take 40 mg by mouth daily.   polyethylene glycol packet Commonly known as:  MIRALAX / GLYCOLAX Take 17 g by mouth 2 (two) times daily.   rivaroxaban 20 MG Tabs tablet Commonly known as:  XARELTO Take 1 tablet (20 mg total) by mouth daily with supper.   vitamin E 400 UNIT capsule Take 400 Units by mouth daily.            Discharge Care Instructions  (From admission, onward)        Start     Ordered   07/04/17 0000  Change dressing    Comments:  Maintain surgical dressing until follow up in the clinic. If the edges start to pull up, may reinforce with tape. If the dressing is no longer working, may remove and cover with gauze and tape, but must keep the area dry and clean.  Call with any questions or concerns.     07/04/17 2947       Signed: [place] Pugh. Xzandria Clevinger   PA-C  07/09/2017, 9:18 AM

## 2017-12-22 ENCOUNTER — Inpatient Hospital Stay (HOSPITAL_COMMUNITY)
Admission: EM | Admit: 2017-12-22 | Discharge: 2018-01-01 | DRG: 314 | Disposition: A | Discharge status: Home Health | Payer: Medicare Other | Attending: Internal Medicine | Admitting: Internal Medicine

## 2017-12-22 ENCOUNTER — Emergency Department (HOSPITAL_COMMUNITY): Payer: Medicare Other

## 2017-12-22 ENCOUNTER — Other Ambulatory Visit: Payer: Self-pay

## 2017-12-22 ENCOUNTER — Encounter (HOSPITAL_COMMUNITY): Payer: Self-pay | Admitting: Emergency Medicine

## 2017-12-22 DIAGNOSIS — K8020 Calculus of gallbladder without cholecystitis without obstruction: Secondary | ICD-10-CM | POA: Diagnosis not present

## 2017-12-22 DIAGNOSIS — R740 Nonspecific elevation of levels of transaminase and lactic acid dehydrogenase [LDH]: Secondary | ICD-10-CM

## 2017-12-22 DIAGNOSIS — I110 Hypertensive heart disease with heart failure: Secondary | ICD-10-CM | POA: Diagnosis present

## 2017-12-22 DIAGNOSIS — Z808 Family history of malignant neoplasm of other organs or systems: Secondary | ICD-10-CM | POA: Diagnosis not present

## 2017-12-22 DIAGNOSIS — Z96642 Presence of left artificial hip joint: Secondary | ICD-10-CM | POA: Diagnosis present

## 2017-12-22 DIAGNOSIS — E869 Volume depletion, unspecified: Secondary | ICD-10-CM | POA: Diagnosis present

## 2017-12-22 DIAGNOSIS — N390 Urinary tract infection, site not specified: Secondary | ICD-10-CM | POA: Diagnosis present

## 2017-12-22 DIAGNOSIS — R410 Disorientation, unspecified: Secondary | ICD-10-CM | POA: Diagnosis not present

## 2017-12-22 DIAGNOSIS — E871 Hypo-osmolality and hyponatremia: Secondary | ICD-10-CM | POA: Diagnosis not present

## 2017-12-22 DIAGNOSIS — K219 Gastro-esophageal reflux disease without esophagitis: Secondary | ICD-10-CM | POA: Diagnosis present

## 2017-12-22 DIAGNOSIS — Z8249 Family history of ischemic heart disease and other diseases of the circulatory system: Secondary | ICD-10-CM | POA: Diagnosis not present

## 2017-12-22 DIAGNOSIS — A419 Sepsis, unspecified organism: Secondary | ICD-10-CM

## 2017-12-22 DIAGNOSIS — Z801 Family history of malignant neoplasm of trachea, bronchus and lung: Secondary | ICD-10-CM | POA: Diagnosis not present

## 2017-12-22 DIAGNOSIS — I248 Other forms of acute ischemic heart disease: Secondary | ICD-10-CM | POA: Diagnosis present

## 2017-12-22 DIAGNOSIS — R062 Wheezing: Secondary | ICD-10-CM

## 2017-12-22 DIAGNOSIS — I4892 Unspecified atrial flutter: Secondary | ICD-10-CM | POA: Diagnosis present

## 2017-12-22 DIAGNOSIS — E872 Acidosis: Secondary | ICD-10-CM | POA: Diagnosis present

## 2017-12-22 DIAGNOSIS — J849 Interstitial pulmonary disease, unspecified: Secondary | ICD-10-CM | POA: Diagnosis not present

## 2017-12-22 DIAGNOSIS — I959 Hypotension, unspecified: Secondary | ICD-10-CM | POA: Diagnosis not present

## 2017-12-22 DIAGNOSIS — R443 Hallucinations, unspecified: Secondary | ICD-10-CM | POA: Diagnosis not present

## 2017-12-22 DIAGNOSIS — R791 Abnormal coagulation profile: Secondary | ICD-10-CM | POA: Diagnosis present

## 2017-12-22 DIAGNOSIS — R0602 Shortness of breath: Secondary | ICD-10-CM

## 2017-12-22 DIAGNOSIS — G629 Polyneuropathy, unspecified: Secondary | ICD-10-CM | POA: Diagnosis present

## 2017-12-22 DIAGNOSIS — Z7901 Long term (current) use of anticoagulants: Secondary | ICD-10-CM

## 2017-12-22 DIAGNOSIS — R7989 Other specified abnormal findings of blood chemistry: Secondary | ICD-10-CM

## 2017-12-22 DIAGNOSIS — N179 Acute kidney failure, unspecified: Secondary | ICD-10-CM | POA: Diagnosis present

## 2017-12-22 DIAGNOSIS — I4581 Long QT syndrome: Secondary | ICD-10-CM | POA: Diagnosis present

## 2017-12-22 DIAGNOSIS — R945 Abnormal results of liver function studies: Secondary | ICD-10-CM

## 2017-12-22 DIAGNOSIS — S22000A Wedge compression fracture of unspecified thoracic vertebra, initial encounter for closed fracture: Secondary | ICD-10-CM | POA: Diagnosis present

## 2017-12-22 DIAGNOSIS — Z853 Personal history of malignant neoplasm of breast: Secondary | ICD-10-CM

## 2017-12-22 DIAGNOSIS — K7589 Other specified inflammatory liver diseases: Secondary | ICD-10-CM | POA: Diagnosis present

## 2017-12-22 DIAGNOSIS — I10 Essential (primary) hypertension: Secondary | ICD-10-CM | POA: Diagnosis present

## 2017-12-22 DIAGNOSIS — J8410 Pulmonary fibrosis, unspecified: Secondary | ICD-10-CM | POA: Diagnosis present

## 2017-12-22 DIAGNOSIS — I481 Persistent atrial fibrillation: Secondary | ICD-10-CM | POA: Diagnosis present

## 2017-12-22 DIAGNOSIS — R112 Nausea with vomiting, unspecified: Secondary | ICD-10-CM | POA: Diagnosis not present

## 2017-12-22 DIAGNOSIS — M25511 Pain in right shoulder: Secondary | ICD-10-CM | POA: Diagnosis present

## 2017-12-22 DIAGNOSIS — D689 Coagulation defect, unspecified: Secondary | ICD-10-CM | POA: Diagnosis not present

## 2017-12-22 DIAGNOSIS — R197 Diarrhea, unspecified: Secondary | ICD-10-CM | POA: Diagnosis not present

## 2017-12-22 DIAGNOSIS — I351 Nonrheumatic aortic (valve) insufficiency: Secondary | ICD-10-CM | POA: Diagnosis not present

## 2017-12-22 DIAGNOSIS — I429 Cardiomyopathy, unspecified: Secondary | ICD-10-CM | POA: Diagnosis present

## 2017-12-22 DIAGNOSIS — I9589 Other hypotension: Secondary | ICD-10-CM | POA: Diagnosis not present

## 2017-12-22 DIAGNOSIS — M4854XA Collapsed vertebra, not elsewhere classified, thoracic region, initial encounter for fracture: Secondary | ICD-10-CM | POA: Diagnosis present

## 2017-12-22 DIAGNOSIS — K8010 Calculus of gallbladder with chronic cholecystitis without obstruction: Secondary | ICD-10-CM | POA: Diagnosis present

## 2017-12-22 DIAGNOSIS — I340 Nonrheumatic mitral (valve) insufficiency: Secondary | ICD-10-CM | POA: Diagnosis not present

## 2017-12-22 DIAGNOSIS — R7401 Elevation of levels of liver transaminase levels: Secondary | ICD-10-CM

## 2017-12-22 DIAGNOSIS — R739 Hyperglycemia, unspecified: Secondary | ICD-10-CM | POA: Diagnosis present

## 2017-12-22 DIAGNOSIS — I5043 Acute on chronic combined systolic (congestive) and diastolic (congestive) heart failure: Secondary | ICD-10-CM | POA: Diagnosis not present

## 2017-12-22 DIAGNOSIS — R0600 Dyspnea, unspecified: Secondary | ICD-10-CM

## 2017-12-22 LAB — COMPREHENSIVE METABOLIC PANEL
ALT: 51 U/L — ABNORMAL HIGH (ref 0–44)
AST: 97 U/L — ABNORMAL HIGH (ref 15–41)
Albumin: 3.3 g/dL — ABNORMAL LOW (ref 3.5–5.0)
Alkaline Phosphatase: 85 U/L (ref 38–126)
Anion gap: 12 (ref 5–15)
BUN: 10 mg/dL (ref 8–23)
CO2: 21 mmol/L — ABNORMAL LOW (ref 22–32)
Calcium: 9 mg/dL (ref 8.9–10.3)
Chloride: 103 mmol/L (ref 98–111)
Creatinine, Ser: 1.15 mg/dL — ABNORMAL HIGH (ref 0.44–1.00)
GFR calc Af Amer: 51 mL/min — ABNORMAL LOW (ref >60)
GFR calc non Af Amer: 44 mL/min — ABNORMAL LOW (ref >60)
Glucose, Bld: 162 mg/dL — ABNORMAL HIGH (ref 70–99)
Potassium: 3.6 mmol/L (ref 3.5–5.1)
Sodium: 136 mmol/L (ref 135–145)
Total Bilirubin: 1.1 mg/dL (ref 0.3–1.2)
Total Protein: 6.2 g/dL — ABNORMAL LOW (ref 6.5–8.1)

## 2017-12-22 LAB — URINALYSIS, ROUTINE W REFLEX MICROSCOPIC
Bilirubin Urine: NEGATIVE
Glucose, UA: 50 mg/dL — AB
Hgb urine dipstick: NEGATIVE
Ketones, ur: NEGATIVE mg/dL
Leukocytes, UA: NEGATIVE
Nitrite: NEGATIVE
Protein, ur: >=300 mg/dL — AB
Specific Gravity, Urine: 1.017 (ref 1.005–1.030)
pH: 5 (ref 5.0–8.0)

## 2017-12-22 LAB — CBC WITH DIFFERENTIAL/PLATELET
Basophils Absolute: 0 10*3/uL (ref 0.0–0.1)
Basophils Relative: 0 %
Eosinophils Absolute: 0.1 10*3/uL (ref 0.0–0.7)
Eosinophils Relative: 1 %
HCT: 43.9 % (ref 36.0–46.0)
Hemoglobin: 14.5 g/dL (ref 12.0–15.0)
Lymphocytes Relative: 15 %
Lymphs Abs: 1.5 10*3/uL (ref 0.7–4.0)
MCH: 31.1 pg (ref 26.0–34.0)
MCHC: 33 g/dL (ref 30.0–36.0)
MCV: 94.2 fL (ref 78.0–100.0)
Monocytes Absolute: 1 10*3/uL (ref 0.1–1.0)
Monocytes Relative: 9 %
Neutro Abs: 7.6 10*3/uL (ref 1.7–7.7)
Neutrophils Relative %: 75 %
Platelets: 223 10*3/uL (ref 150–400)
RBC: 4.66 MIL/uL (ref 3.87–5.11)
RDW: 15.5 % (ref 11.5–15.5)
WBC: 10.2 10*3/uL (ref 4.0–10.5)

## 2017-12-22 LAB — TROPONIN I: Troponin I: <0.03 ng/mL (ref <0.03)

## 2017-12-22 LAB — LACTIC ACID, PLASMA
Lactic Acid, Venous: 3.3 mmol/L (ref 0.5–1.9)
Lactic Acid, Venous: 3.9 mmol/L (ref 0.5–1.9)

## 2017-12-22 MED ORDER — ONDANSETRON HCL 4 MG/2ML IJ SOLN
INTRAMUSCULAR | Status: AC
  Administered 2017-12-22: 4 mg via INTRAVENOUS
  Filled 2017-12-22: qty 2

## 2017-12-22 MED ORDER — CALCIUM CARBONATE-VITAMIN D 500-200 MG-UNIT PO TABS
ORAL_TABLET | Freq: Every morning | ORAL | Status: DC
  Filled 2017-12-22 (×2): qty 1

## 2017-12-22 MED ORDER — RIVAROXABAN 15 MG PO TABS
15.0000 mg | ORAL_TABLET | Freq: Every day | ORAL | Status: DC
  Administered 2017-12-23: 15 mg via ORAL
  Filled 2017-12-22: qty 1

## 2017-12-22 MED ORDER — VITAMIN E 180 MG (400 UNIT) PO CAPS: 400.0000 [IU] | ORAL_CAPSULE | Freq: Every morning | ORAL | Status: DC

## 2017-12-22 MED ORDER — ONDANSETRON HCL 4 MG/2ML IJ SOLN
4.0000 mg | Freq: Once | INTRAMUSCULAR | Status: AC
  Administered 2017-12-22: 4 mg via INTRAVENOUS

## 2017-12-22 MED ORDER — ONDANSETRON HCL 4 MG/2ML IJ SOLN
4.0000 mg | Freq: Four times a day (QID) | INTRAMUSCULAR | Status: DC | PRN
  Administered 2017-12-23 – 2017-12-29 (×2): 4 mg via INTRAVENOUS
  Filled 2017-12-22 (×2): qty 2

## 2017-12-22 MED ORDER — PIPERACILLIN-TAZOBACTAM 3.375 G IVPB 30 MIN
3.3750 g | Freq: Once | INTRAVENOUS | Status: AC
  Administered 2017-12-22: 3.375 g via INTRAVENOUS
  Filled 2017-12-22: qty 50

## 2017-12-22 MED ORDER — LACTATED RINGERS IV SOLN
INTRAVENOUS | Status: DC
  Administered 2017-12-23: via INTRAVENOUS

## 2017-12-22 MED ORDER — FENTANYL CITRATE (PF) 100 MCG/2ML IJ SOLN
75.0000 ug | Freq: Once | INTRAMUSCULAR | Status: AC
  Administered 2017-12-22: 75 ug via INTRAVENOUS
  Filled 2017-12-22: qty 2

## 2017-12-22 MED ORDER — CO Q10 100 MG PO CAPS: 100.0000 mg | ORAL_CAPSULE | Freq: Every morning | ORAL | Status: DC

## 2017-12-22 MED ORDER — ACETAMINOPHEN 325 MG PO TABS
650.0000 mg | ORAL_TABLET | Freq: Four times a day (QID) | ORAL | Status: DC | PRN
  Administered 2017-12-23: 650 mg via ORAL
  Filled 2017-12-22: qty 2

## 2017-12-22 MED ORDER — ACETAMINOPHEN 650 MG RE SUPP: 650.0000 mg | Freq: Four times a day (QID) | RECTAL | Status: DC | PRN

## 2017-12-22 MED ORDER — PANTOPRAZOLE SODIUM 40 MG PO TBEC
40.0000 mg | DELAYED_RELEASE_TABLET | Freq: Every morning | ORAL | Status: DC
  Administered 2017-12-23 – 2017-12-25 (×3): 40 mg via ORAL
  Filled 2017-12-22 (×3): qty 1

## 2017-12-22 MED ORDER — FLECAINIDE ACETATE 50 MG PO TABS
50.0000 mg | ORAL_TABLET | Freq: Two times a day (BID) | ORAL | Status: DC
  Administered 2017-12-23 – 2018-01-01 (×19): 50 mg via ORAL
  Filled 2017-12-22 (×20): qty 1

## 2017-12-22 MED ORDER — SODIUM CHLORIDE 0.9 % IV BOLUS
2000.0000 mL | Freq: Once | INTRAVENOUS | Status: AC
  Administered 2017-12-22: 2000 mL via INTRAVENOUS

## 2017-12-22 MED ORDER — ONDANSETRON HCL 4 MG PO TABS
4.0000 mg | ORAL_TABLET | Freq: Four times a day (QID) | ORAL | Status: DC | PRN
  Administered 2017-12-23: 4 mg via ORAL
  Filled 2017-12-22: qty 1

## 2017-12-22 MED ORDER — SODIUM CHLORIDE 0.9 % IV BOLUS
1000.0000 mL | Freq: Once | INTRAVENOUS | Status: AC
  Administered 2017-12-22: 1000 mL via INTRAVENOUS

## 2017-12-22 MED ORDER — VANCOMYCIN HCL 10 G IV SOLR
1500.0000 mg | Freq: Once | INTRAVENOUS | Status: AC
  Administered 2017-12-22: 1500 mg via INTRAVENOUS
  Filled 2017-12-22 (×2): qty 1500

## 2017-12-22 NOTE — Progress Notes (Signed)
PHARMACIST - PHYSICIAN ORDER COMMUNICATION  CONCERNING: P&T Medication Policy on Herbal Medications  DESCRIPTION:  This patient's order for:  coQ10 and vitamin E  has been noted.  This product(s) is classified as an "herbal" or natural product. Due to a lack of definitive safety studies or FDA approval, nonstandard manufacturing practices, plus the potential risk of unknown drug-drug interactions while on inpatient medications, the Pharmacy and Therapeutics Committee does not permit the use of "herbal" or natural products of this type within [facility].   ACTION TAKEN: The pharmacy department is unable to verify this order at this time and your patient has been informed of this safety policy. Please reevaluate patient's clinical condition at discharge and address if the herbal or natural product(s) should be resumed at that time.

## 2017-12-22 NOTE — ED Notes (Signed)
Dr Raliegh Ip in to assess  Spec to lab  Pt reports relief of N

## 2017-12-22 NOTE — ED Provider Notes (Signed)
[facility] EMERGENCY DEPARTMENT Provider Note   CSN: [id number] Arrival date & time: 12/22/17  1731     History   Chief Complaint Chief Complaint  Patient presents with  . Nausea    HPI [name] is a 77 y.o. female.  HPI   77 year old female with multiple complaints.  Around 2 PM this afternoon she went to lay down when she felt generally weak.  Shortly after getting into bed she began feeling nauseated and was also diaphoretic.  She got up to use the bathroom and felt dizzy which she describes as a sensation as if she may pass out.  She is able to get herself back in bed but continued to feel nauseated and had an ache in her right shoulder.  Mild shortness of breath.  Symptoms improved after Zofran here in the emergency room.  She still feels generally weak and has also developped an ache in her R shoulder which she did not have earlier.  Denies any pain anywhere else. No shortness or breath. No fever. No other joint pain. No rash.   Past Medical History:  Diagnosis Date  . Allergic rhinitis   . Breast cancer ([place])       . Dyspnea    chronic with exertion   . GERD (gastroesophageal reflux disease)   . 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 History of GI bleed    last episode 02-02-16; was due to hemorroids   . History of kidney infection   . History of pulmonary hypertension    mild ; (see ECHO result in LOV with Dr. Kela Millin 06-18-17 on chart )  . HLD (hyperlipidemia)    denies   . Hypertension   . ILD (interstitial lung disease) ([place])   . Iron deficiency anemia    gets iv infusions  . Persistent atrial fibrillation ([place])    a. s/p PVI 2009 in [place]  . Raynaud phenomenon    no issues currently   . Spinal stenosis    lumbar     Patient Active Problem List   Diagnosis Date Noted  . Overweight (BMI 25.0-29.9) 07/04/2017  . S/P left THA, AA 07/03/2017  . S/P hip replacement 07/03/2017  . ILD (interstitial lung disease) ([place]) 05/17/2016  . Asbestos exposure  05/17/2016  . Raynaud phenomenon 05/17/2016  . Shortness of breath 03/22/2016  . GERD (gastroesophageal reflux disease) 03/22/2016  . Arthritis 03/22/2016  . Chronic seasonal allergic rhinitis 03/22/2016  . Pancreatitis 03/09/2015  . Essential hypertension   . Loss of weight   . Thoracic compression fracture ([place])   . Paroxysmal atrial fibrillation ([facility])   . Atrial flutter ([place]) 11/17/2014  . Breast cancer of upper-outer quadrant of left female breast ([place]) 06/25/2014    Past Surgical History:  Procedure Laterality Date  . ABLATION  2009   PVI in [place]  . afib ablation  10/06/2010  . APPENDECTOMY    . BREAST LUMPECTOMY Left 2007   malignant  . BREAST SURGERY Left 2007   partial mastectomy and lumpectomy   . CARDIOVERSION N/A 11/18/2014   Procedure: CARDIOVERSION;  Surgeon: Adrian Prows, MD;  Location: [facility];  Service: Cardiovascular;  Laterality: N/A;  . carpel tunnel    . CESAREAN SECTION    . GANGLION CYST EXCISION    . HEMORRHOID SURGERY    . KYPHOPLASTY  2017  . TONSILLECTOMY    . TOTAL HIP ARTHROPLASTY Left 07/03/2017   Procedure: LEFT TOTAL HIP ARTHROPLASTY ANTERIOR APPROACH;  Surgeon:  Paralee Cancel, MD;  Location: [facility];  Service: Orthopedics;  Laterality: Left;  70 mins     OB History   None      Home Medications    Prior to Admission medications   Medication Sig Start Date End Date Taking? Authorizing Provider  amLODipine (NORVASC) 2.5 MG tablet Take 2.5 mg by mouth daily.  02/19/16   [provider]  atenolol (TENORMIN) 50 MG tablet Take 50 mg by mouth daily.    [provider]  Calcium Carb-Cholecalciferol (CALCIUM 600+D3 PO) Take 1 tablet by mouth daily.    [provider]  Coenzyme Q10 (CO Q10) 100 MG CAPS Take 100 mg by mouth daily.    [provider]  docusate sodium (COLACE) 100 MG capsule Take 1 capsule (100 mg total) by mouth 2 (two) times daily. 07/03/17   Danae Orleans, PA-C  ferrous sulfate (FERROUSUL) 325  (65 FE) MG tablet Take 1 tablet (325 mg total) by mouth 3 (three) times daily with meals. 07/03/17   Danae Orleans, PA-C  flecainide (TAMBOCOR) 50 MG tablet Take 1 tablet (50 mg total) by mouth every 12 (twelve) hours. Patient taking differently: Take 50 mg by mouth daily.  11/18/14   Neldon Labella, NP  gabapentin (NEURONTIN) 300 MG capsule Take 600 mg by mouth at bedtime.    [provider]  hydrochlorothiazide (HYDRODIURIL) 25 MG tablet Take 25 mg by mouth daily.    [provider]  HYDROcodone-acetaminophen (NORCO) 7.5-325 MG tablet Take 1-2 tablets by mouth every 4 (four) hours as needed for moderate pain. 07/03/17   Danae Orleans, PA-C  methocarbamol (ROBAXIN) 500 MG tablet Take 1 tablet (500 mg total) by mouth every 6 (six) hours as needed for muscle spasms. 07/03/17   Danae Orleans, PA-C  pantoprazole (PROTONIX) 40 MG tablet Take 40 mg by mouth daily.    [provider]  polyethylene glycol (MIRALAX / GLYCOLAX) packet Take 17 g by mouth 2 (two) times daily. 07/03/17   Danae Orleans, PA-C  rivaroxaban (XARELTO) 20 MG TABS tablet Take 1 tablet (20 mg total) by mouth daily with supper. 11/18/14   Neldon Labella, NP  vitamin E 400 UNIT capsule Take 400 Units by mouth daily.     [provider]    Family History Family History  Problem Relation Age of Onset  . Lung cancer Sister   . Heart failure Mother   . Brain cancer Father   . Lung disease Neg Hx   . Rheumatologic disease Neg Hx     Social History Social History   Tobacco Use  . Smoking status: Passive Smoke Exposure - Never Smoker  . Smokeless tobacco: Never Used  . Tobacco comment: exposed to second-hand smoke her entire life  Substance Use Topics  . Alcohol use: Yes    Comment: weekley beer  . Drug use: No     Allergies   Aspirin; Blue dyes (parenteral); and Demerol [meperidine]   Review of Systems Review of Systems  All systems reviewed and negative, other than as  noted in HPI.  Physical Exam Updated Vital Signs BP 93/67 (BP Location: Left Arm)   Pulse 75   Temp 98.1 F (36.7 C) (Oral)   Resp 16   Ht 5\' 4"  (1.626 m)   Wt 69.9 kg   SpO2 98%   BMI 26.43 kg/m   Physical Exam  Constitutional: She appears well-developed and well-nourished. No distress.  HENT:  Head: Normocephalic and atraumatic.  Eyes: Conjunctivae are  normal. Right eye exhibits no discharge. Left eye exhibits no discharge.  Neck: Neck supple.  Cardiovascular: Normal rate, regular rhythm and normal heart sounds. Exam reveals no gallop and no friction rub.  No murmur heard. Pulmonary/Chest: Effort normal and breath sounds normal. No respiratory distress.  Abdominal: Soft. She exhibits no distension. There is no tenderness.  Musculoskeletal: She exhibits no edema or tenderness.  R shoulder normal in appearance and symmetric as compared to L. No apparent pain with active or passive ROM. No discrete tenderness.   Neurological: She is alert.  Skin: Skin is warm and dry.  Psychiatric: She has a normal mood and affect. Her behavior is normal. Thought content normal.  Nursing note and vitals reviewed.    ED Treatments / Results  Labs (all labs ordered are listed, but only abnormal results are displayed) Labs Reviewed  COMPREHENSIVE METABOLIC PANEL - Abnormal; Notable for the following components:      Result Value   CO2 21 (*)    Glucose, Bld 162 (*)    Creatinine, Ser 1.15 (*)    Total Protein 6.2 (*)    Albumin 3.3 (*)    AST 97 (*)    ALT 51 (*)    GFR calc non Af Amer 44 (*)    GFR calc Af Amer 51 (*)    All other components within normal limits  LACTIC ACID, PLASMA - Abnormal; Notable for the following components:   Lactic Acid, Venous 3.3 (*)    All other components within normal limits  LACTIC ACID, PLASMA - Abnormal; Notable for the following components:   Lactic Acid, Venous 3.9 (*)    All other components within normal limits  URINALYSIS, ROUTINE W REFLEX  MICROSCOPIC - Abnormal; Notable for the following components:   Color, Urine AMBER (*)    APPearance CLOUDY (*)    Glucose, UA 50 (*)    Protein, ur >=300 (*)    Bacteria, UA RARE (*)    All other components within normal limits  CULTURE, BLOOD (ROUTINE X 2)  CULTURE, BLOOD (ROUTINE X 2)  URINE CULTURE  CBC WITH DIFFERENTIAL/PLATELET  TROPONIN I    EKG EKG Interpretation  Date/Time:  Saturday December 22 2017 17:40:28 EDT Ventricular Rate:  78 PR Interval:    QRS Duration: 98 QT Interval:  453 QTC Calculation: 516 R Axis:   129 Text Interpretation:  Sinus rhythm Short PR interval Anteroseptal infarct, age indeterminate Prolonged QT interval Confirmed by Virgel Manifold [phone]) on 12/22/2017 5:53:02 PM   Radiology Dg Chest 2 View  Result Date: 12/22/2017 CLINICAL DATA:  77 y/o  F; chest pain with intermittent nausea. EXAM: CHEST - 2 VIEW COMPARISON:  03/28/2016 CT chest.  11/16/2014 chest radiograph. FINDINGS: Mild cardiomegaly. Calcific aortic atherosclerosis. Coarse reticular opacities greatest at the lung bases compatible pulmonary fibrosis. No consolidation, effusion, or pneumothorax. T12 chronic compression deformity. Bones are otherwise unremarkable. IMPRESSION: 1. Mild cardiomegaly. 2. Basilar predominant pulmonary fibrosis.  No focal consolidation. 3.  Aortic Atherosclerosis (ICD10-I70.0). 4. Chronic T12 compression deformity. Electronically Signed   By: Kristine Garbe M.D.   On: 12/22/2017 19:34    Procedures Procedures (including critical care time)  CRITICAL CARE Performed by: Virgel Manifold Total critical care time: 35 minutes Critical care time was exclusive of separately billable procedures and treating other patients. Critical care was necessary to treat or prevent imminent or life-threatening deterioration. Critical care was time spent personally by me on the following activities: development of treatment plan with patient  and/or surrogate as well as  nursing, discussions with consultants, evaluation of patient's response to treatment, examination of patient, obtaining history from patient or surrogate, ordering and performing treatments and interventions, ordering and review of laboratory studies, ordering and review of radiographic studies, pulse oximetry and re-evaluation of patient's condition.  Medications Ordered in ED Medications  ondansetron (ZOFRAN) injection 4 mg (4 mg Intravenous Given 12/22/17 1743)     Initial Impression / Assessment and Plan / ED Course  I have reviewed the triage vital signs and the nursing notes.  Pertinent labs & imaging results that were available during my care of the patient were reviewed by me and considered in my medical decision making (see chart for details).     6:38 PM Increasingly hypotensive. I'm not sure of exact etiology. Will bolus IVF. She is complaining of increasing R shoulder pain. Declining pain meds. Will repeat EKG. CXR. Will obtain some additional labs and check rectal temp. I clinically doubt sepsis though. May order abx if BP doesn't respond.   Doesn't seem cardiogenic. Lungs sounds clear. CXR clear. No JVD.  Denies dyspnea. Troponin normal thus far. EKG abnormal but nonspecific.  H/H ok.    Mild AKI and elevation in LFTs. Hypoperfusion?  Heart/lung exam unchanged. Continues to deny dyspnea or abdominal pain.   9:20 PM Repeat lactic even higher. Abx ordered. I'm still not sure of the underlying process here. After a dose of pain meds she says he shoulder pain is minimal. Heart/lung exam still unremarkable and repeat abdominal exam is still benign as well. She really has no other symptoms and doesn't seem distressed but BP remains soft. I did a bedside US and she does not have a pericardial effusion. I am admittedly not the most skilled at [facility] but overall contractility did not seem significantly abnormal.    Final Clinical Impressions(s) / ED Diagnoses   Final diagnoses:    Hypotension, unspecified hypotension type  Acute pain of right shoulder    ED Discharge Orders    None       Virgel Manifold, MD 12/23/17 1246

## 2017-12-22 NOTE — ED Triage Notes (Signed)
Pt reports intermittent N 0 Vomiting today  D x 1 today

## 2017-12-22 NOTE — ED Notes (Signed)
Family at bedside. 

## 2017-12-22 NOTE — ED Notes (Signed)
Call to Barnett Applebaum, RN, [facility] for [employer]

## 2017-12-22 NOTE — ED Notes (Signed)
Dr K in to reassess 

## 2017-12-22 NOTE — ED Notes (Signed)
Pt complains of R shoulder pain

## 2017-12-22 NOTE — ED Notes (Signed)
Dr Raliegh Ip informed of BP

## 2017-12-22 NOTE — ED Notes (Signed)
Call to floor Ramandi, RN is perusing the chart and will call back

## 2017-12-22 NOTE — ED Notes (Signed)
Awaiting meds and bed assignment

## 2017-12-22 NOTE — ED Notes (Signed)
Pt requests meds for pain  Dr Raliegh Ip informed

## 2017-12-22 NOTE — ED Notes (Signed)
Pt reports that she is supposed to go to [place] next weekend on a cruise

## 2017-12-22 NOTE — ED Notes (Signed)
In and out cath   Urine is dark with particulate   Spec to lab

## 2017-12-22 NOTE — H&P (Addendum)
History and Physical    Karina Tran QMV:[id number] DOB: 09-Dec-1940 DOA: 12/22/2017  PCP: Curly Rim, MD   Patient coming from: Home  Chief Complaint: Nausea/presyncope  HPI: Karina Tran is a 77 y.o. female with medical history significant for atrial flutter/fibrillation on Xarelto, ILD, hypertension, prior pancreatitis, and thoracic compression fracture who presented to the emergency department with sudden onset weakness as well as nausea, vomiting, diaphoresis as well as some presyncope that began at approximately 2 PM this afternoon as she was going to go used to bathroom.  She denies any chest pain, palpitations, shortness of breath, fever, chills, diarrhea, or abdominal pain.  She had her daughter called EMS and upon arrival to the ED also developed an ache to her right shoulder.  She states that she has been taking her medications as prescribed and has not had any other symptoms.  She denies any particular aggravating or alleviating factors.   ED Course: Vital signs demonstrate some soft blood pressure readings and hypotension on arrival to the ED with systolic blood pressure in the 70 range.  She has received 2 L of IV fluid thus far with improvement of her blood pressure into the 90 range.  She continues to feel weak and nauseous as well as lightheaded.  She is noted to have a creatinine of 1.15 with baseline of 0.6, her glucose is 162, and her lactic acid initially was 3.3 and has elevated to 3.9 despite administration of IV fluid.  Two-view chest x-ray with mild cardiomegaly and no other significant findings.  EKG with sinus rhythm no acute findings.  Troponin less than 0.03.  Bedside ultrasound exam performed by ED physician with no signs of pericardial effusion and EF appears to be preserved.  Review of Systems: All others reviewed and otherwise negative.  Past Medical History:  Diagnosis Date  . Allergic rhinitis   . Breast cancer ([place])       . Dyspnea    chronic with exertion   . GERD (gastroesophageal reflux disease)   . 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 History of GI bleed    last episode 02-02-16; was due to hemorroids   . History of kidney infection   . History of pulmonary hypertension    mild ; (see ECHO result in LOV with Dr. Kela Millin 06-18-17 on chart )  . HLD (hyperlipidemia)    denies   . Hypertension   . ILD (interstitial lung disease) ([place])   . Iron deficiency anemia    gets iv infusions  . Persistent atrial fibrillation ([place])    a. s/p PVI 2009 in [place]  . Raynaud phenomenon    no issues currently   . Spinal stenosis    lumbar     Past Surgical History:  Procedure Laterality Date  . ABLATION  2009   PVI in [place]  . afib ablation  10/06/2010  . APPENDECTOMY    . BREAST LUMPECTOMY Left 2007   malignant  . BREAST SURGERY Left 2007   partial mastectomy and lumpectomy   . CARDIOVERSION N/A 11/18/2014   Procedure: CARDIOVERSION;  Surgeon: Adrian Prows, MD;  Location: [facility];  Service: Cardiovascular;  Laterality: N/A;  . carpel tunnel    . CESAREAN SECTION    . GANGLION CYST EXCISION    . HEMORRHOID SURGERY    . KYPHOPLASTY  2017  . TONSILLECTOMY    . TOTAL HIP ARTHROPLASTY Left 07/03/2017   Procedure: LEFT TOTAL HIP ARTHROPLASTY ANTERIOR APPROACH;  Surgeon:  Paralee Cancel, MD;  Location: [facility];  Service: Orthopedics;  Laterality: Left;  70 mins     reports that she is a non-smoker but has been exposed to tobacco smoke. She has never used smokeless tobacco. She reports that she drinks alcohol. She reports that she does not use drugs.  Allergies  Allergen Reactions  . Aspirin Other (See Comments)    Bloody noses  . Blue Dyes (Parenteral) Nausea And Vomiting  . Demerol [Meperidine] Nausea And Vomiting    Family History  Problem Relation Age of Onset  . Lung cancer Sister   . Heart failure Mother   . Brain cancer Father   . Lung disease Neg Hx   . Rheumatologic disease Neg Hx     Prior to Admission  medications   Medication Sig Start Date End Date Taking? Authorizing Provider  amLODipine (NORVASC) 5 MG tablet Take 5 mg by mouth every morning.  02/19/16  Yes [provider]  atenolol (TENORMIN) 50 MG tablet Take 50 mg by mouth daily.   Yes [provider]  Calcium Carb-Cholecalciferol (CALCIUM 600+D3 PO) Take 1 tablet by mouth every morning.    Yes [provider]  Coenzyme Q10 (CO Q10) 100 MG CAPS Take 100 mg by mouth every morning.    Yes [provider]  flecainide (TAMBOCOR) 50 MG tablet Take 1 tablet (50 mg total) by mouth every 12 (twelve) hours. Patient taking differently: Take 100 mg by mouth every morning.  11/18/14  Yes Neldon Labella, NP  gabapentin (NEURONTIN) 300 MG capsule Take 300 mg by mouth at bedtime.    Yes [provider]  hydrochlorothiazide (HYDRODIURIL) 25 MG tablet Take 25 mg by mouth daily.   Yes [provider]  pantoprazole (PROTONIX) 40 MG tablet Take 40 mg by mouth every morning.    Yes [provider]  potassium gluconate 595 (99 K) MG TABS tablet Take 595 mg by mouth daily.   Yes [provider]  rivaroxaban (XARELTO) 20 MG TABS tablet Take 1 tablet (20 mg total) by mouth daily with supper. Patient taking differently: Take 20 mg by mouth every morning.  11/18/14  Yes Neldon Labella, NP  vitamin E 400 UNIT capsule Take 400 Units by mouth every morning.    Yes [provider]    Physical Exam: Vitals:   12/22/17 2046 12/22/17 2100 12/22/17 2130 12/22/17 2200  BP: 100/73 (!) 89/66 94/69 (!) 91/56  Pulse: 76 73 73 73  Resp: 15 20 20  (!) 24  Temp:      TempSrc:      SpO2: 93% 99% 98% 100%  Weight:      Height:        Constitutional: NAD, calm, comfortable Vitals:   12/22/17 2046 12/22/17 2100 12/22/17 2130 12/22/17 2200  BP: 100/73 (!) 89/66 94/69 (!) 91/56  Pulse: 76 73 73 73  Resp: 15 20 20  (!) 24  Temp:      TempSrc:      SpO2: 93% 99% 98% 100%  Weight:        Height:       Eyes: lids and conjunctivae normal ENMT: Mucous membranes are moist.  Neck: normal, supple Respiratory: clear to auscultation bilaterally. Normal respiratory effort. No accessory muscle use.  On 2 L nasal cannula. Cardiovascular: Regular rate and rhythm, no murmurs. No extremity edema. Abdomen: no tenderness, no distention. Bowel sounds positive.  Musculoskeletal:  No joint deformity upper and lower extremities.   Skin: no rashes,  lesions, ulcers.  Psychiatric: Normal judgment and insight. Alert and oriented x 3. Normal mood.   Labs on Admission: I have personally reviewed following labs and imaging studies  CBC: Recent Labs  Lab 12/22/17 1745  WBC 10.2  NEUTROABS 7.6  HGB 14.5  HCT 43.9  MCV 94.2  PLT 947   Basic Metabolic Panel: Recent Labs  Lab 12/22/17 1745  NA 136  K 3.6  CL 103  CO2 21*  GLUCOSE 162*  BUN 10  CREATININE 1.15*  CALCIUM 9.0   GFR: Estimated Creatinine Clearance: 38.7 mL/min (A) (by C-G formula based on SCr of 1.15 mg/dL (H)). Liver Function Tests: Recent Labs  Lab 12/22/17 1745  AST 97*  ALT 51*  ALKPHOS 85  BILITOT 1.1  PROT 6.2*  ALBUMIN 3.3*   No results for input(s): LIPASE, AMYLASE in the last 168 hours. No results for input(s): AMMONIA in the last 168 hours. Coagulation Profile: No results for input(s): INR, PROTIME in the last 168 hours. Cardiac Enzymes: Recent Labs  Lab 12/22/17 1745  TROPONINI <0.03   BNP (last 3 results) No results for input(s): PROBNP in the last 8760 hours. HbA1C: No results for input(s): HGBA1C in the last 72 hours. CBG: No results for input(s): GLUCAP in the last 168 hours. Lipid Profile: No results for input(s): CHOL, HDL, LDLCALC, TRIG, CHOLHDL, LDLDIRECT in the last 72 hours. Thyroid Function Tests: No results for input(s): TSH, T4TOTAL, FREET4, T3FREE, THYROIDAB in the last 72 hours. Anemia Panel: No results for input(s): VITAMINB12, FOLATE, FERRITIN, TIBC, IRON, RETICCTPCT  in the last 72 hours. Urine analysis:    Component Value Date/Time   COLORURINE AMBER (A) 12/22/2017 1838   APPEARANCEUR CLOUDY (A) 12/22/2017 1838   LABSPEC 1.017 12/22/2017 1838   PHURINE 5.0 12/22/2017 1838   GLUCOSEU 50 (A) 12/22/2017 1838   HGBUR NEGATIVE 12/22/2017 1838   BILIRUBINUR NEGATIVE 12/22/2017 1838   KETONESUR NEGATIVE 12/22/2017 1838   PROTEINUR >=300 (A) 12/22/2017 1838   UROBILINOGEN 1.0 02/04/2015 1533   NITRITE NEGATIVE 12/22/2017 1838   LEUKOCYTESUR NEGATIVE 12/22/2017 1838    Radiological Exams on Admission: Dg Chest 2 View  Result Date: 12/22/2017 CLINICAL DATA:  77 y/o  F; chest pain with intermittent nausea. EXAM: CHEST - 2 VIEW COMPARISON:  03/28/2016 CT chest.  11/16/2014 chest radiograph. FINDINGS: Mild cardiomegaly. Calcific aortic atherosclerosis. Coarse reticular opacities greatest at the lung bases compatible pulmonary fibrosis. No consolidation, effusion, or pneumothorax. T12 chronic compression deformity. Bones are otherwise unremarkable. IMPRESSION: 1. Mild cardiomegaly. 2. Basilar predominant pulmonary fibrosis.  No focal consolidation. 3.  Aortic Atherosclerosis (ICD10-I70.0). 4. Chronic T12 compression deformity. Electronically Signed   By: Kristine Garbe M.D.   On: 12/22/2017 19:34    EKG: Independently reviewed.  Sinus rhythm at 78 bpm.  Assessment/Plan Principal Problem:   Hypotension, unspecified Active Problems:   Atrial flutter (HCC)   Essential hypertension   Thoracic compression fracture (HCC)   GERD (gastroesophageal reflux disease)   ILD (interstitial lung disease) (HCC)   Acute lower UTI   Nausea & vomiting   AKI (acute kidney injury) ([place])    1. Symptomatic hypotension with presyncope.  Patient does not appear to have a clear cause of the hypotension.  Does not appear to be cardiogenic, but will check 2D echocardiogram.  Will check a.m. cortisol as well as orthostatics in am.  She does have some mild findings of UTI  but does not overall appear to be septic, but will continue high rate of  IV fluid as well as empiric antibiotics with blood cultures and urine cultures pending.  Will check procalcitonin to assist with antibiotic weaning.  Monitor in stepdown unit in case pressors might be needed. 2. Possible UTI.  She is asymptomatic from this but urine analysis does demonstrate some mild leukocytosis and rare bacteria.  Will order urine cultures and follow.  Maintain on vancomycin and Zosyn for now.  3. Lactic acidosis.  Likely related to poor perfusion.  We will continue to monitor repeat lactic acid in a.m. and maintain on aggressive IV fluid hydration. 4. AKI.  Likely related to poor perfusion.  Maintain on aggressive IV fluid hydration and avoid nephrotoxic agents and monitor strict I's and O's as well as daily weights and recheck a.m. labs. 5. Nausea and vomiting with right shoulder pain.  Symptomatology suggestive of cholecystitis and patient does have a gallbladder.  Will check right upper quadrant ultrasound for further evaluation. Zofran as needed and CLD for now. 6. Atrial flutter/fibrillation.  Monitor on telemetry.  Patient currently appears to be in sinus rhythm.  Hold atenolol and maintain on flecainide as well as Xarelto. 7. Hypertension.  Hold antihypertensives to include amlodipine, atenolol, and HCTZ until blood pressure improves. 8. GERD.  Maintain on PPI. 9. Neuropathy.  With hold gabapentin for now due to hypotension.   DVT prophylaxis: Xarelto Code Status: Full Family Communication: None at bedside Disposition Plan: Evaluation of hypotension and UTI Consults called: None Admission status: Inpatient, stepdown unit   Pratik D Manuella Ghazi DO [facility] Pager [phone]  If 7PM-7AM, please contact night-coverage [email or web address] Password [facility]  12/22/2017, 10:02 PM

## 2017-12-22 NOTE — ED Notes (Signed)
Critical level  Lactic acid 3.9  Dr Wilson Singer apprised

## 2017-12-22 NOTE — ED Notes (Signed)
Date and time results received: 12/22/17 @19 :56  Test: lactic  Critical Value: 3.3  Name of Provider Notified: Dr Wilson Singer  Orders Received? Or Actions Taken?: no additional orders given,

## 2017-12-22 NOTE — ED Notes (Signed)
Pt to rad

## 2017-12-22 NOTE — ED Notes (Signed)
Dr Manuella Ghazi in to assess

## 2017-12-23 ENCOUNTER — Inpatient Hospital Stay (HOSPITAL_COMMUNITY): Payer: Medicare Other

## 2017-12-23 ENCOUNTER — Encounter (HOSPITAL_COMMUNITY): Payer: Self-pay | Admitting: Radiology

## 2017-12-23 DIAGNOSIS — R7401 Elevation of levels of liver transaminase levels: Secondary | ICD-10-CM

## 2017-12-23 DIAGNOSIS — R740 Nonspecific elevation of levels of transaminase and lactic acid dehydrogenase [LDH]: Secondary | ICD-10-CM

## 2017-12-23 DIAGNOSIS — R112 Nausea with vomiting, unspecified: Secondary | ICD-10-CM

## 2017-12-23 DIAGNOSIS — I351 Nonrheumatic aortic (valve) insufficiency: Secondary | ICD-10-CM

## 2017-12-23 DIAGNOSIS — I340 Nonrheumatic mitral (valve) insufficiency: Secondary | ICD-10-CM

## 2017-12-23 LAB — BLOOD GAS, ARTERIAL
Acid-base deficit: 12.8 mmol/L — ABNORMAL HIGH (ref 0.0–2.0)
Bicarbonate: 15.2 mmol/L — ABNORMAL LOW (ref 20.0–28.0)
DRAWN BY: 2223
FIO2: 21
O2 Saturation: 95.5 %
pCO2 arterial: 22.6 mmHg — ABNORMAL LOW (ref 32.0–48.0)
pH, Arterial: 7.341 — ABNORMAL LOW (ref 7.350–7.450)
pO2, Arterial: 93.6 mmHg (ref 83.0–108.0)

## 2017-12-23 LAB — ECHOCARDIOGRAM COMPLETE
Height: 64 in
WEIGHTICAEL: 2691.38 [oz_av]

## 2017-12-23 LAB — COMPREHENSIVE METABOLIC PANEL
ALBUMIN: 2.7 g/dL — AB (ref 3.5–5.0)
ALK PHOS: 56 U/L (ref 38–126)
ALT: 187 U/L — ABNORMAL HIGH (ref 0–44)
ALT: 753 U/L — ABNORMAL HIGH (ref 0–44)
ANION GAP: 8 (ref 5–15)
AST: 270 U/L — ABNORMAL HIGH (ref 15–41)
AST: 1376 U/L — ABNORMAL HIGH (ref 15–41)
Albumin: 2.7 g/dL — ABNORMAL LOW (ref 3.5–5.0)
Alkaline Phosphatase: 63 U/L (ref 38–126)
Anion gap: 10 (ref 5–15)
BUN: 12 mg/dL (ref 8–23)
BUN: 16 mg/dL (ref 8–23)
CHLORIDE: 108 mmol/L (ref 98–111)
CHLORIDE: 108 mmol/L (ref 98–111)
CO2: 20 mmol/L — AB (ref 22–32)
CO2: 16 mmol/L — ABNORMAL LOW (ref 22–32)
CREATININE: 1.19 mg/dL — AB (ref 0.44–1.00)
Calcium: 7.8 mg/dL — ABNORMAL LOW (ref 8.9–10.3)
Calcium: 7.5 mg/dL — ABNORMAL LOW (ref 8.9–10.3)
Creatinine, Ser: 1.86 mg/dL — ABNORMAL HIGH (ref 0.44–1.00)
GFR calc non Af Amer: 43 mL/min — ABNORMAL LOW (ref >60)
GFR, EST AFRICAN AMERICAN: 49 mL/min — AB (ref >60)
GFR, EST AFRICAN AMERICAN: 29 mL/min — AB (ref >60)
GFR, EST NON AFRICAN AMERICAN: 25 mL/min — AB (ref >60)
Glucose, Bld: 123 mg/dL — ABNORMAL HIGH (ref 70–99)
Glucose, Bld: 142 mg/dL — ABNORMAL HIGH (ref 70–99)
POTASSIUM: 4.5 mmol/L (ref 3.5–5.1)
Potassium: 3.7 mmol/L (ref 3.5–5.1)
SODIUM: 136 mmol/L (ref 135–145)
SODIUM: 134 mmol/L — AB (ref 135–145)
Total Bilirubin: 1.3 mg/dL — ABNORMAL HIGH (ref 0.3–1.2)
Total Bilirubin: 1.2 mg/dL (ref 0.3–1.2)
Total Protein: 5 g/dL — ABNORMAL LOW (ref 6.5–8.1)
Total Protein: 5.1 g/dL — ABNORMAL LOW (ref 6.5–8.1)

## 2017-12-23 LAB — CBC
HEMATOCRIT: 40.6 % (ref 36.0–46.0)
HEMOGLOBIN: 12.9 g/dL (ref 12.0–15.0)
MCH: 29.8 pg (ref 26.0–34.0)
MCHC: 31.8 g/dL (ref 30.0–36.0)
MCV: 93.8 fL (ref 78.0–100.0)
Platelets: 205 10*3/uL (ref 150–400)
RBC: 4.33 MIL/uL (ref 3.87–5.11)
RDW: 15.4 % (ref 11.5–15.5)
WBC: 8.2 10*3/uL (ref 4.0–10.5)

## 2017-12-23 LAB — CBC WITH DIFFERENTIAL/PLATELET
BASOS PCT: 0 %
Basophils Absolute: 0 10*3/uL (ref 0.0–0.1)
EOS ABS: 0.1 10*3/uL (ref 0.0–0.7)
Eosinophils Relative: 1 %
HCT: 40.6 % (ref 36.0–46.0)
HEMOGLOBIN: 12.9 g/dL (ref 12.0–15.0)
Lymphocytes Relative: 15 %
Lymphs Abs: 1.6 10*3/uL (ref 0.7–4.0)
MCH: 30.4 pg (ref 26.0–34.0)
MCHC: 31.8 g/dL (ref 30.0–36.0)
MCV: 95.8 fL (ref 78.0–100.0)
Monocytes Absolute: 1.3 10*3/uL — ABNORMAL HIGH (ref 0.1–1.0)
Monocytes Relative: 13 %
NEUTROS PCT: 71 %
Neutro Abs: 7.6 10*3/uL (ref 1.7–7.7)
Platelets: 191 10*3/uL (ref 150–400)
RBC: 4.24 MIL/uL (ref 3.87–5.11)
RDW: 15.6 % — ABNORMAL HIGH (ref 11.5–15.5)
WBC: 10.7 10*3/uL — AB (ref 4.0–10.5)

## 2017-12-23 LAB — LACTIC ACID, PLASMA
LACTIC ACID, VENOUS: 2.9 mmol/L — AB (ref 0.5–1.9)
LACTIC ACID, VENOUS: 3 mmol/L — AB (ref 0.5–1.9)
LACTIC ACID, VENOUS: 4.2 mmol/L — AB (ref 0.5–1.9)
LACTIC ACID, VENOUS: 3.7 mmol/L — AB (ref 0.5–1.9)

## 2017-12-23 LAB — TROPONIN I
Troponin I: <0.03 ng/mL (ref <0.03)
Troponin I: 0.03 ng/mL (ref <0.03)
Troponin I: 0.03 ng/mL (ref <0.03)

## 2017-12-23 LAB — AMMONIA: Ammonia: 20 umol/L (ref 9–35)

## 2017-12-23 LAB — GLUCOSE, CAPILLARY: GLUCOSE-CAPILLARY: 126 mg/dL — AB (ref 70–99)

## 2017-12-23 LAB — PROCALCITONIN

## 2017-12-23 LAB — CK: CK TOTAL: 22 U/L — AB (ref 38–234)

## 2017-12-23 LAB — MRSA PCR SCREENING: MRSA by PCR: NEGATIVE

## 2017-12-23 LAB — CORTISOL-AM, BLOOD: Cortisol - AM: 42.7 ug/dL — ABNORMAL HIGH (ref 6.7–22.6)

## 2017-12-23 LAB — LIPASE, BLOOD: Lipase: 26 U/L (ref 11–51)

## 2017-12-23 MED ORDER — VANCOMYCIN HCL IN DEXTROSE 1-5 GM/200ML-% IV SOLN
1000.0000 mg | INTRAVENOUS | Status: DC
  Administered 2017-12-23: 1000 mg via INTRAVENOUS
  Filled 2017-12-23: qty 200

## 2017-12-23 MED ORDER — PIPERACILLIN-TAZOBACTAM 3.375 G IVPB
3.3750 g | Freq: Three times a day (TID) | INTRAVENOUS | Status: DC
  Administered 2017-12-23 – 2017-12-26 (×11): 3.375 g via INTRAVENOUS
  Filled 2017-12-23 (×10): qty 50

## 2017-12-23 MED ORDER — SODIUM CHLORIDE 0.9 % IV BOLUS
1000.0000 mL | Freq: Once | INTRAVENOUS | Status: AC
  Administered 2017-12-23: 1000 mL via INTRAVENOUS

## 2017-12-23 MED ORDER — SODIUM CHLORIDE 0.9 % IV BOLUS
500.0000 mL | Freq: Once | INTRAVENOUS | Status: AC
  Administered 2017-12-23: 500 mL via INTRAVENOUS

## 2017-12-23 MED ORDER — METRONIDAZOLE IN NACL 5-0.79 MG/ML-% IV SOLN: 500.0000 mg | Freq: Three times a day (TID) | INTRAVENOUS | Status: DC

## 2017-12-23 MED ORDER — PROMETHAZINE HCL 25 MG/ML IJ SOLN
12.5000 mg | Freq: Four times a day (QID) | INTRAMUSCULAR | Status: DC | PRN
  Administered 2017-12-23: 12.5 mg via INTRAVENOUS
  Filled 2017-12-23: qty 1

## 2017-12-23 MED ORDER — SODIUM CHLORIDE 0.9 % IV BOLUS (SEPSIS)
500.0000 mL | Freq: Once | INTRAVENOUS | Status: AC
  Administered 2017-12-24: 500 mL via INTRAVENOUS

## 2017-12-23 MED ORDER — KETOROLAC TROMETHAMINE 15 MG/ML IJ SOLN
15.0000 mg | Freq: Once | INTRAMUSCULAR | Status: AC
  Administered 2017-12-23: 15 mg via INTRAVENOUS
  Filled 2017-12-23: qty 1

## 2017-12-23 MED ORDER — METOCLOPRAMIDE HCL 5 MG/ML IJ SOLN
5.0000 mg | Freq: Three times a day (TID) | INTRAMUSCULAR | Status: DC
  Administered 2017-12-23 – 2017-12-27 (×13): 5 mg via INTRAVENOUS
  Filled 2017-12-23 (×13): qty 2

## 2017-12-23 MED ORDER — IOPAMIDOL (ISOVUE-300) INJECTION 61%
30.0000 mL | Freq: Once | INTRAVENOUS | Status: AC | PRN
  Administered 2017-12-24: 30 mL via ORAL

## 2017-12-23 MED ORDER — TRAMADOL HCL 50 MG PO TABS
50.0000 mg | ORAL_TABLET | Freq: Four times a day (QID) | ORAL | Status: DC | PRN
  Administered 2017-12-23 (×2): 50 mg via ORAL
  Filled 2017-12-23 (×2): qty 1

## 2017-12-23 MED ORDER — SODIUM CHLORIDE 0.9 % IV SOLN
INTRAVENOUS | Status: DC
  Administered 2017-12-23 (×2): via INTRAVENOUS

## 2017-12-23 NOTE — Progress Notes (Signed)
I was called to assess the patient regarding SOB and acute delirium.  She was talking normally throughout the day, but over the past hour has been confused, delirious, hallucinating, and somewhat SOB.  On exam, she is oriented only x 1 and appears fidgety.  She is hemodynamically stable other than mild tachynpea with an O2 sat of 100% on RA; the remainder of her exam is generally unremarkable other than mild diffuse abdominal pain.    She has received IVF throughout the day but reportedly has not voided and does not have a significant volume on bladder scan.  Pertinent labs/studies:  This am - CO2 20 Glucose 123 BN 12/Creatinine 1.19/GFR 43; prior 9/0.64/>60 in 3/19 Albumin 2.7 AST 270/ALT 187/Bili 1.3 Troponin <0.03 0.03 Lactate 3.3, 3.9, 2.9, 3.0 Procalcitonin <0.10 Normal CBC  Abd [place]: cholelithiasis without apparent acute cholecytitis  For now, will order stat labs (CBC, CMP, Lactate, Troponin) Dr. Carles Collet has ordered stat CXR and CT head Will need to consider additional tests based on these results.  Carlyon Shadow, M.D.

## 2017-12-23 NOTE — Progress Notes (Signed)
CRITICAL VALUE ALERT  Critical Value:  Troponin 0.03  Date & Time Notied:  12/23/17 9355  Provider Notified: Dr. Carles Collet  Orders Received/Actions taken: No New Orders at this time

## 2017-12-23 NOTE — Progress Notes (Addendum)
Called by RN due to dyspnea and confusiion  RUQ ultrasound noted with GB neck stone. Pt likely has cholecystitis with elevated LFTs despite no abd pain.  General surgery consulted. Continue zosyn and vanc pending cultures  VSS--100%RA, RR25--109/66 HR 80  Noted lactic still elevated; PCT <0.10 Bolus another 1L Increase IVF to 125 cc/hr Continue abx EKG personally reviewed--sinus, nonspecific T wave changes  Ordered CXR CT Brain  DTat

## 2017-12-23 NOTE — Progress Notes (Signed)
Patient has continued to progress with her acute illness.   She is currently continuing to be hemodynamically stable, but she is not making urine and her labs are uptrending.  She has had no UOP since the 7pm nurse arrived.  Bladder scan at this time shows only 61 cc.  She received a bolus of 1L IVF prior to shift change.  Meanwhile, her labs are worrisome for:  CO2 16 (prior 20) BN 16/Creatinine 1.86/GFR 25 (12/1.19/43) AST 1376/ALT 753 (270/187) Troponin remain stable at 0.03 Lactate is up to 4.2 (previously in 3 range) WBC is 10.7  Head CT was unremarkable CXR with CM, vascular congestion, mild ?edema; ?superimposed PNA   I am concerned that this patient is developing septic shock with multisystem organ failure.  I have called to discuss the patient with PCCM (Dr. Oletta Darter).  Could be an intraabdominal source - infection, hyperammonemia.  Suggest PO contrast through an NG tube and then get a STAT abdominal CT.  She needs a GI consult - can speak by telephone tonight, consider hepatitis serology.  Give 500 cc bolus now given EF 45% - may be underresuscitated.  Would benefit from renal US tomorrow.  Check an ABG to assess acid-base balance; she may need a co-ox, pressors, CVL if the concern is due to hypoperfusion from the heart - this may progress through the night.  If pH is not good (7.2 or less), consider a bicarb drip of 50-75/hr with repeat ABG in the AM; if much less than 7.2, give a bicarb push.  Suggest consideration of CVL to obtain a CVP and co-ox.    Patient will be discussed with Dr. Manuella Ghazi, who will covering this patient through the night tonight.  Carlyon Shadow, M.D.

## 2017-12-23 NOTE — Progress Notes (Signed)
ANTIBIOTIC CONSULT NOTE-Preliminary  Pharmacy Consult for zosyn Indication: sepsis  Allergies  Allergen Reactions  . Aspirin Other (See Comments)    Bloody noses  . Blue Dyes (Parenteral) Nausea And Vomiting  . Demerol [Meperidine] Nausea And Vomiting    Patient Measurements: Height: 5\' 4"  (162.6 cm) Weight: 154 lb (69.9 kg) IBW/kg (Calculated) : 54.7 Adjusted Body Weight:   Vital Signs: Temp: 97.6 F (36.4 C) (09/14 2300) Temp Source: Oral (09/14 2300) BP: 96/71 (09/15 0200) Pulse Rate: 76 (09/15 0200)  Labs: Recent Labs    12/22/17 1745  WBC 10.2  HGB 14.5  PLT 223  CREATININE 1.15*    Estimated Creatinine Clearance: 38.7 mL/min (A) (by C-G formula based on SCr of 1.15 mg/dL (H)).  No results for input(s): VANCOTROUGH, VANCOPEAK, VANCORANDOM, GENTTROUGH, GENTPEAK, GENTRANDOM, TOBRATROUGH, TOBRAPEAK, TOBRARND, AMIKACINPEAK, AMIKACINTROU, AMIKACIN in the last 72 hours.   Microbiology: Recent Results (from the past 720 hour(s))  Blood culture (routine x 2)     Status: None (Preliminary result)   Collection Time: 12/22/17  6:45 PM  Result Value Ref Range Status   Specimen Description RIGHT ANTECUBITAL  Final   Special Requests   Final    BOTTLES DRAWN AEROBIC AND ANAEROBIC Blood Culture adequate volume Performed at [facility], [address], [place], [place] [address]    Culture PENDING  Incomplete   Report Status PENDING  Incomplete  Blood culture (routine x 2)     Status: None (Preliminary result)   Collection Time: 12/22/17  7:30 PM  Result Value Ref Range Status   Specimen Description RIGHT ANTECUBITAL  Final   Special Requests   Final    BOTTLES DRAWN AEROBIC ONLY Blood Culture adequate volume Performed at [facility], [address] [place], [place] [address]    Culture PENDING  Incomplete   Report Status PENDING  Incomplete    Medical History: Past Medical History:  Diagnosis Date  . Allergic rhinitis   . Breast cancer ([place])       .  Dyspnea    chronic with exertion   . GERD (gastroesophageal reflux disease)   . 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 History of GI bleed    last episode 02-02-16; was due to hemorroids   . History of kidney infection   . History of pulmonary hypertension    mild ; (see ECHO result in LOV with Dr. Kela Millin 06-18-17 on chart )  . HLD (hyperlipidemia)    denies   . Hypertension   . ILD (interstitial lung disease) ([place])   . Iron deficiency anemia    gets iv infusions  . Persistent atrial fibrillation ([place])    a. s/p PVI 2009 in [place]  . Raynaud phenomenon    no issues currently   . Spinal stenosis    lumbar     Medications:  Scheduled:  . calcium-vitamin D   Oral q morning - 10a  . flecainide  50 mg Oral Q12H  . pantoprazole  40 mg Oral q morning - 10a  . rivaroxaban  20 mg Oral q morning - 10a   Infusions:  . lactated ringers 125 mL/hr at 12/23/17 0025  . piperacillin-tazobactam (ZOSYN)  IV     PRN: acetaminophen **OR** acetaminophen, ondansetron **OR** ondansetron (ZOFRAN) IV Anti-infectives (From admission, onward)   Start     Dose/Rate Route Frequency Ordered Stop   12/23/17 0600  piperacillin-tazobactam (ZOSYN) IVPB 3.375 g     3.375 g 12.5 mL/hr over 240 Minutes Intravenous  Every 8 hours 12/23/17 0310     12/22/17 2115  piperacillin-tazobactam (ZOSYN) IVPB 3.375 g     3.375 g 100 mL/hr over 30 Minutes Intravenous  Once 12/22/17 2114 12/22/17 2152   12/22/17 2115  vancomycin (VANCOCIN) 1,500 mg in sodium chloride 0.9 % 500 mL IVPB     1,500 mg 250 mL/hr over 120 Minutes Intravenous  Once 12/22/17 2114 12/23/17 0026      Assessment: 77 yo female with symptomatic hypotension with presyncope, possible UTI, and lactic acidosis.  Starting vancomycin and zosyn.    Goal of Therapy:  Vancomycin trough level 15-20 mcg/ml  Plan:  Preliminary review of pertinent patient information completed.  Protocol will be initiated with dose(s) of zosyn 3.375 grams Q8 hours.  Forestine Na  clinical pharmacist will complete review during morning rounds to assess patient and finalize treatment regimen if needed.  Nyra Capes, [facility] 12/23/2017,3:11 AM

## 2017-12-23 NOTE — Progress Notes (Signed)
Bed alarm was going off, nurse came in to assess. Pt found very fidgety trying to get out of bed. Stated she felt nauseous and was gagging. Stated "I feel like I am going to die". Stated short of breath. Vitals within normal range. No urine output today. Bladder scan showed no urine retention. Blood sugar check showed 126. Temperature unable to read. Pt confused with intermittent slurred speech. Pupils round,brisk, reactive. States she is in [place]. Keeps saying random sentences. Says she feels tired. ELink informed and will keep an eye out. Dr. Carles Collet informed. Gave new orders. Night shift doctor coming to assess.

## 2017-12-23 NOTE — Progress Notes (Signed)
Pharmacy Antibiotic Note  Karina Tran is a 77 y.o. female admitted on 12/22/2017 with sepsis.  Pharmacy has been consulted for Vancomycin and zosyn dosing.  Plan: Vancomycin 1500mg  IV loading dose, then 1000mg  IV every 24 hours.  Goal trough 15-20 mcg/mL. Zosyn 3.375g IV q8h (4 hour infusion).  F/U cxs and clinical progress Monitor V/S,labs, and levels as indicated  Height: 5\' 4"  (162.6 cm) Weight: 168 lb 3.4 oz (76.3 kg) IBW/kg (Calculated) : 54.7  Temp (24hrs), Avg:97.8 F (36.6 C), Min:97.3 F (36.3 C), Max:98.6 F (37 C)  Recent Labs  Lab 12/22/17 1745 12/22/17 1845 12/22/17 2033 12/23/17 0405 12/23/17 0406 12/23/17 0901  WBC 10.2  --   --  8.2  --   --   CREATININE 1.15*  --   --  1.19*  --   --   LATICACIDVEN  --  3.3* 3.9*  --  2.9* 3.0*    Estimated Creatinine Clearance: 38.9 mL/min (A) (by C-G formula based on SCr of 1.19 mg/dL (H)).    Allergies  Allergen Reactions  . Aspirin Other (See Comments)    Bloody noses  . Blue Dyes (Parenteral) Nausea And Vomiting  . Demerol [Meperidine] Nausea And Vomiting    Antimicrobials this admission: Vancomycin 9/14 >> Zosyn 9/14 >>   Dose adjustments this admission: N/A  Microbiology results: 9/14 BCx: pending 9/14 UCx: pending 9/14 MRSA PCR: negative  Thank you for allowing pharmacy to be a part of this patient's care.  Isac Sarna, BS Pharm D, [place] Clinical Pharmacist Pager [phone]  12/23/2017 10:17 AM

## 2017-12-23 NOTE — Progress Notes (Signed)
*  PRELIMINARY RESULTS* Echocardiogram 2D Echocardiogram has been performed.  Karina Tran 12/23/2017, 10:41 AM

## 2017-12-23 NOTE — Progress Notes (Signed)
PROGRESS NOTE  Karina Tran ITG:[id number] DOB: Nov 25, 1940 DOA: 12/22/2017 PCP: Curly Rim, MD  Brief History:  77 year old female with a history of persistent atrial fibrillation, hypertension, interstitial lung disease presenting with acute onset of nausea, vomiting, generalized weakness, diaphoresis that began around 2 PM on 12/22/2017.  The patient had been in her usual state of health up until 2 PM on 12/22/2017.  The patient decided to go take a nap after brunch on 12/22/2017.  She woke up to go to the bathroom.  At that time, the patient felt lightheaded and dizzy.  She subsequently had a loose stool without any hematochezia or melena.  When she went back to her bedroom, the patient had generalized weakness with diaphoresis, but she denied any chest discomfort, shortness of breath, visual disturbance, focal extremity weakness.  As result, EMS was activated.  The patient denied any recent travels, exotic or undercooked foods, or sick contacts.  She lives with her daughter and son-in-law and her grandchildren all of whom are in good health.  She denies any fevers, chills, abdominal pain, dysuria, hematuria, hematochezia, melena, rashes, synovitis.  She has not had any recent travels.  She has not had any recent changes in any of her medications.  The patient had numerous episodes of dry heaves on 12/22/2017 which continued in the emergency department. In the emergency department, the patient was noted to be afebrile but hypotensive with blood pressure of 77/58.  She was saturating 100% on room air.  She was noted to have elevated LFTs with AST 97, ALT 51, alk phosphatase 85, total bilirubin 1.1.  WBC was 10.2.  Lactic acid peaked at 3.9.  She was started on vancomycin and Zosyn after blood cultures and urine cultures were obtained.  Chest x-ray showed pulmonary fibrosis without any consolidations.  Notably, the patient developed vague right shoulder pain that she described as moderate,  intermittent, with a dull ache sensation.  Assessment/Plan: Hypotension -Concerned about sepsis -Continue empiric vancomycin and Zosyn pending culture data and further work-up -Partly also due to volume depletion from vomiting -Procalcitonin <0.10 -Check cortisol -RUQ ultrasound -Echocardiogram  -Continue IV fluids  Right Shoulder pain/Transaminasemia -concerned about cholecystitis -partly due to ischemic hepatitis due to hypotension -Hepatitis B surface antigen -Hep C antibody -Continue empiric antibiotics -am CMP -check lipase  AKI -Secondary to volume depletion -Baseline creatinine 0.6-0.8  Pyuria -continue empiric abx pending culture data  Persistent atrial fibrillation -Holding atenolol secondary to hypotension -Continue flecainide -Continue rivaroxaban -personally reviewed EKG--sinus, RSR'  Hyperglycemia -Hemoglobin A1c  Interstitial Lung Disease -no dyspnea -stable on RA    Disposition Plan:   Home in 1-2 days  Family Communication:  No Family at bedside  Consultants:  none  Code Status:  FULL  DVT Prophylaxis:  rivaroxaban   Procedures: As Listed in Progress Note Above  Antibiotics: vanco 9/14>>> Zosyn 9/14>>>  The patient is critically ill with multiple organ systems failure and requires high complexity decision making for assessment and support, frequent evaluation and titration of therapies, application of advanced monitoring technologies and extensive interpretation of multiple databases.  Critical care time - 40 mins.      Subjective: Patient denies fevers, chills, headache, chest pain, dyspnea, nausea, vomiting, diarrhea, abdominal pain, dysuria, hematuria, hematochezia, and melena. She complains of vague right shoulder pain, moderated with dull ache and intermittent without radiation.   Objective: Vitals:   12/23/17 0500 12/23/17 0700 12/23/17 0716 12/23/17 0800  BP:  109/66 103/68 111/64  Pulse: 77 73 75 78  Resp: (!) 21    (!) 25  Temp:   (!) 97.3 F (36.3 C)   TempSrc:   Axillary   SpO2: 98%  100%   Weight:      Height:        Intake/Output Summary (Last 24 hours) at 12/23/2017 0838 Last data filed at 12/23/2017 0300 Gross per 24 hour  Intake 1427.21 ml  Output -  Net 1427.21 ml   Weight change:  Exam:   General:  Pt is alert, follows commands appropriately, not in acute distress  HEENT: No icterus, No thrush, No neck mass, [place]/AT  Cardiovascular: RRR, S1/S2, no rubs, no gallops  Respiratory: CTA bilaterally, no wheezing, no crackles, no rhonchi  Abdomen: Soft/+BS, non tender, non distended, no guarding  Extremities: No edema, No lymphangitis, No petechiae, No rashes, no synovitis ;  Right shoulder without erythema, edema, synovitis   Data Reviewed: I have personally reviewed following labs and imaging studies Basic Metabolic Panel: Recent Labs  Lab 12/22/17 1745 12/23/17 0405  NA 136 136  K 3.6 4.5  CL 103 108  CO2 21* 20*  GLUCOSE 162* 123*  BUN 10 12  CREATININE 1.15* 1.19*  CALCIUM 9.0 7.8*   Liver Function Tests: Recent Labs  Lab 12/22/17 1745 12/23/17 0405  AST 97* 270*  ALT 51* 187*  ALKPHOS 85 63  BILITOT 1.1 1.3*  PROT 6.2* 5.0*  ALBUMIN 3.3* 2.7*   No results for input(s): LIPASE, AMYLASE in the last 168 hours. No results for input(s): AMMONIA in the last 168 hours. Coagulation Profile: No results for input(s): INR, PROTIME in the last 168 hours. CBC: Recent Labs  Lab 12/22/17 1745 12/23/17 0405  WBC 10.2 8.2  NEUTROABS 7.6  --   HGB 14.5 12.9  HCT 43.9 40.6  MCV 94.2 93.8  PLT 223 205   Cardiac Enzymes: Recent Labs  Lab 12/22/17 1745 12/22/17 2033 12/23/17 0405  TROPONINI <0.03 <0.03 <0.03   BNP: Invalid input(s): POCBNP CBG: No results for input(s): GLUCAP in the last 168 hours. HbA1C: No results for input(s): HGBA1C in the last 72 hours. Urine analysis:    Component Value Date/Time   COLORURINE AMBER (A) 12/22/2017 1838    APPEARANCEUR CLOUDY (A) 12/22/2017 1838   LABSPEC 1.017 12/22/2017 1838   PHURINE 5.0 12/22/2017 1838   GLUCOSEU 50 (A) 12/22/2017 1838   HGBUR NEGATIVE 12/22/2017 1838   BILIRUBINUR NEGATIVE 12/22/2017 1838   KETONESUR NEGATIVE 12/22/2017 1838   PROTEINUR >=300 (A) 12/22/2017 1838   UROBILINOGEN 1.0 02/04/2015 1533   NITRITE NEGATIVE 12/22/2017 1838   LEUKOCYTESUR NEGATIVE 12/22/2017 1838   Sepsis Labs: '@LABRCNTIP'$ (procalcitonin:4,lacticidven:4) ) Recent Results (from the past 240 hour(s))  Blood culture (routine x 2)     Status: None (Preliminary result)   Collection Time: 12/22/17  6:45 PM  Result Value Ref Range Status   Specimen Description RIGHT ANTECUBITAL  Final   Special Requests   Final    BOTTLES DRAWN AEROBIC AND ANAEROBIC Blood Culture adequate volume   Culture   Final    NO GROWTH < 12 HOURS Performed at [facility], [address] [place], [place] [address]    Report Status PENDING  Incomplete  Blood culture (routine x 2)     Status: None (Preliminary result)   Collection Time: 12/22/17  7:30 PM  Result Value Ref Range Status   Specimen Description RIGHT ANTECUBITAL  Final   Special Requests  Final    BOTTLES DRAWN AEROBIC ONLY Blood Culture adequate volume   Culture   Final    NO GROWTH < 12 HOURS Performed at [facility], [address], [place], [place] [address]    Report Status PENDING  Incomplete  MRSA PCR Screening     Status: None   Collection Time: 12/22/17 11:18 PM  Result Value Ref Range Status   MRSA by PCR NEGATIVE NEGATIVE Final    Comment:        The GeneXpert MRSA Assay (FDA approved for NASAL specimens only), is one component of a comprehensive MRSA colonization surveillance program. It is not intended to diagnose MRSA infection nor to guide or monitor treatment for MRSA infections. Performed at [facility], [address] [place], [place] [address]      Scheduled Meds: . calcium-vitamin D   Oral q morning - 10a  . flecainide   50 mg Oral Q12H  . pantoprazole  40 mg Oral q morning - 10a  . Rivaroxaban  15 mg Oral QPC supper   Continuous Infusions: . lactated ringers Stopped (12/23/17 0831)  . piperacillin-tazobactam (ZOSYN)  IV 3.375 g (12/23/17 4859)    Procedures/Studies: Dg Chest 2 View  Result Date: 12/22/2017 CLINICAL DATA:  77 y/o  F; chest pain with intermittent nausea. EXAM: CHEST - 2 VIEW COMPARISON:  03/28/2016 CT chest.  11/16/2014 chest radiograph. FINDINGS: Mild cardiomegaly. Calcific aortic atherosclerosis. Coarse reticular opacities greatest at the lung bases compatible pulmonary fibrosis. No consolidation, effusion, or pneumothorax. T12 chronic compression deformity. Bones are otherwise unremarkable. IMPRESSION: 1. Mild cardiomegaly. 2. Basilar predominant pulmonary fibrosis.  No focal consolidation. 3.  Aortic Atherosclerosis (ICD10-I70.0). 4. Chronic T12 compression deformity. Electronically Signed   By: Kristine Garbe M.D.   On: 12/22/2017 19:34    Orson Eva, DO  [facility] Pager [phone]  If 7PM-7AM, please contact night-coverage [email or web address] Password [email or web address] 12/23/2017, 8:38 AM   LOS: 1 day

## 2017-12-24 ENCOUNTER — Encounter (HOSPITAL_COMMUNITY): Payer: Self-pay | Admitting: Gastroenterology

## 2017-12-24 ENCOUNTER — Inpatient Hospital Stay (HOSPITAL_COMMUNITY): Payer: Medicare Other

## 2017-12-24 DIAGNOSIS — A419 Sepsis, unspecified organism: Secondary | ICD-10-CM

## 2017-12-24 DIAGNOSIS — R740 Nonspecific elevation of levels of transaminase and lactic acid dehydrogenase [LDH]: Secondary | ICD-10-CM

## 2017-12-24 DIAGNOSIS — K8020 Calculus of gallbladder without cholecystitis without obstruction: Secondary | ICD-10-CM

## 2017-12-24 LAB — HEPATIC FUNCTION PANEL
ALBUMIN: 2.8 g/dL — AB (ref 3.5–5.0)
ALT: 2200 U/L — ABNORMAL HIGH (ref 0–44)
AST: 5226 U/L — ABNORMAL HIGH (ref 15–41)
Alkaline Phosphatase: 54 U/L (ref 38–126)
Bilirubin, Direct: 1.2 mg/dL — ABNORMAL HIGH (ref 0.0–0.2)
Indirect Bilirubin: 1.6 mg/dL — ABNORMAL HIGH (ref 0.3–0.9)
TOTAL PROTEIN: 5.2 g/dL — AB (ref 6.5–8.1)
Total Bilirubin: 2.8 mg/dL — ABNORMAL HIGH (ref 0.3–1.2)

## 2017-12-24 LAB — CBC WITH DIFFERENTIAL/PLATELET
Basophils Absolute: 0 10*3/uL (ref 0.0–0.1)
Basophils Relative: 0 %
EOS PCT: 0 %
Eosinophils Absolute: 0 10*3/uL (ref 0.0–0.7)
HCT: 41 % (ref 36.0–46.0)
Hemoglobin: 13.3 g/dL (ref 12.0–15.0)
LYMPHS ABS: 1.2 10*3/uL (ref 0.7–4.0)
Lymphocytes Relative: 10 %
MCH: 30.8 pg (ref 26.0–34.0)
MCHC: 32.4 g/dL (ref 30.0–36.0)
MCV: 94.9 fL (ref 78.0–100.0)
MONO ABS: 1.3 10*3/uL — AB (ref 0.1–1.0)
MONOS PCT: 11 %
Neutro Abs: 9.8 10*3/uL — ABNORMAL HIGH (ref 1.7–7.7)
Neutrophils Relative %: 79 %
PLATELETS: 188 10*3/uL (ref 150–400)
RBC: 4.32 MIL/uL (ref 3.87–5.11)
RDW: 15.8 % — AB (ref 11.5–15.5)
WBC: 12.4 10*3/uL — ABNORMAL HIGH (ref 4.0–10.5)

## 2017-12-24 LAB — HEMOGLOBIN A1C
Hgb A1c MFr Bld: 5.4 % (ref 4.8–5.6)
Mean Plasma Glucose: 108 mg/dL

## 2017-12-24 LAB — URINE CULTURE: Culture: NO GROWTH

## 2017-12-24 LAB — BASIC METABOLIC PANEL
ANION GAP: 14 (ref 5–15)
BUN: 19 mg/dL (ref 8–23)
CALCIUM: 7.5 mg/dL — AB (ref 8.9–10.3)
CO2: 16 mmol/L — ABNORMAL LOW (ref 22–32)
Chloride: 102 mmol/L (ref 98–111)
Creatinine, Ser: 2.03 mg/dL — ABNORMAL HIGH (ref 0.44–1.00)
GFR, EST AFRICAN AMERICAN: 26 mL/min — AB (ref >60)
GFR, EST NON AFRICAN AMERICAN: 22 mL/min — AB (ref >60)
Glucose, Bld: 103 mg/dL — ABNORMAL HIGH (ref 70–99)
Potassium: 4.2 mmol/L (ref 3.5–5.1)
Sodium: 132 mmol/L — ABNORMAL LOW (ref 135–145)

## 2017-12-24 LAB — COMPREHENSIVE METABOLIC PANEL
ALBUMIN: 2.8 g/dL — AB (ref 3.5–5.0)
ALK PHOS: 55 U/L (ref 38–126)
ALT: 1547 U/L — AB (ref 0–44)
ANION GAP: 12 (ref 5–15)
AST: 3441 U/L — ABNORMAL HIGH (ref 15–41)
BUN: 16 mg/dL (ref 8–23)
CALCIUM: 7.5 mg/dL — AB (ref 8.9–10.3)
CO2: 15 mmol/L — AB (ref 22–32)
CREATININE: 1.86 mg/dL — AB (ref 0.44–1.00)
Chloride: 104 mmol/L (ref 98–111)
GFR calc Af Amer: 29 mL/min — ABNORMAL LOW (ref >60)
GFR calc non Af Amer: 25 mL/min — ABNORMAL LOW (ref >60)
GLUCOSE: 112 mg/dL — AB (ref 70–99)
Potassium: 5.1 mmol/L (ref 3.5–5.1)
SODIUM: 131 mmol/L — AB (ref 135–145)
Total Bilirubin: 2.7 mg/dL — ABNORMAL HIGH (ref 0.3–1.2)
Total Protein: 5.2 g/dL — ABNORMAL LOW (ref 6.5–8.1)

## 2017-12-24 LAB — PROTIME-INR
INR: 6.65 — AB
PROTHROMBIN TIME: 57.6 s — AB (ref 11.4–15.2)

## 2017-12-24 LAB — TROPONIN I
Troponin I: 0.03 ng/mL (ref <0.03)
Troponin I: 0.03 ng/mL (ref <0.03)

## 2017-12-24 LAB — CORTISOL-AM, BLOOD: Cortisol - AM: 28.8 ug/dL — ABNORMAL HIGH (ref 6.7–22.6)

## 2017-12-24 LAB — PROCALCITONIN
Procalcitonin: <0.1 ng/mL
Procalcitonin: <0.1 ng/mL

## 2017-12-24 MED ORDER — FUROSEMIDE 10 MG/ML IJ SOLN
40.0000 mg | Freq: Once | INTRAMUSCULAR | Status: AC
  Administered 2017-12-24: 40 mg via INTRAVENOUS
  Filled 2017-12-24: qty 4

## 2017-12-24 MED ORDER — IPRATROPIUM-ALBUTEROL 0.5-2.5 (3) MG/3ML IN SOLN
3.0000 mL | Freq: Three times a day (TID) | RESPIRATORY_TRACT | Status: DC
  Administered 2017-12-24: 3 mL via RESPIRATORY_TRACT
  Filled 2017-12-24: qty 3

## 2017-12-24 MED ORDER — VITAMIN K1 10 MG/ML IJ SOLN: 2.0000 mg | Freq: Once | INTRAMUSCULAR | Status: DC

## 2017-12-24 MED ORDER — CHLORHEXIDINE GLUCONATE 0.12 % MT SOLN
15.0000 mL | Freq: Two times a day (BID) | OROMUCOSAL | Status: DC
  Administered 2017-12-24 – 2017-12-27 (×6): 15 mL via OROMUCOSAL
  Filled 2017-12-24 (×6): qty 15

## 2017-12-24 MED ORDER — SODIUM BICARBONATE 8.4 % IV SOLN
INTRAVENOUS | Status: DC
  Administered 2017-12-24 – 2017-12-25 (×2): via INTRAVENOUS
  Filled 2017-12-24 (×4): qty 150

## 2017-12-24 MED ORDER — PHYTONADIONE 5 MG PO TABS
2.5000 mg | ORAL_TABLET | Freq: Once | ORAL | Status: DC
  Filled 2017-12-24: qty 1

## 2017-12-24 MED ORDER — SODIUM BICARBONATE 8.4 % IV SOLN
50.0000 meq | Freq: Once | INTRAVENOUS | Status: AC
  Administered 2017-12-24: 50 meq via INTRAVENOUS
  Filled 2017-12-24: qty 50

## 2017-12-24 MED ORDER — PHYTONADIONE 5 MG PO TABS
5.0000 mg | ORAL_TABLET | Freq: Once | ORAL | Status: AC
  Administered 2017-12-24: 5 mg via ORAL
  Filled 2017-12-24: qty 1

## 2017-12-24 MED ORDER — IPRATROPIUM-ALBUTEROL 0.5-2.5 (3) MG/3ML IN SOLN
3.0000 mL | Freq: Three times a day (TID) | RESPIRATORY_TRACT | Status: DC
  Administered 2017-12-25 – 2018-01-01 (×22): 3 mL via RESPIRATORY_TRACT
  Filled 2017-12-24 (×22): qty 3

## 2017-12-24 MED ORDER — ORAL CARE MOUTH RINSE
15.0000 mL | Freq: Two times a day (BID) | OROMUCOSAL | Status: DC
  Administered 2017-12-26 – 2017-12-29 (×5): 15 mL via OROMUCOSAL

## 2017-12-24 NOTE — Progress Notes (Signed)
PROGRESS NOTE  Karina Tran HYW:[id number] DOB: 17-Jun-1940 DOA: 12/22/2017 PCP: Curly Rim, MD  Brief History:  77 year old female with a history of persistent atrial fibrillation, hypertension, interstitial lung disease presenting with acute onset of nausea, vomiting, generalized weakness, diaphoresis that began around 2 PM on 12/22/2017.  The patient had been in her usual state of health up until 2 PM on 12/22/2017.  The patient decided to go take a nap after brunch on 12/22/2017.  She woke up to go to the bathroom.  At that time, the patient felt lightheaded and dizzy.  She subsequently had a loose stool without any hematochezia or melena.  When she went back to her bedroom, the patient had generalized weakness with diaphoresis, but she denied any chest discomfort, shortness of breath, visual disturbance, focal extremity weakness.  As result, EMS was activated.  The patient denied any recent travels, exotic or undercooked foods, or sick contacts.  She lives with her daughter and son-in-law and her grandchildren all of whom are in good health.  She denies any fevers, chills, abdominal pain, dysuria, hematuria, hematochezia, melena, rashes, synovitis.  She has not had any recent travels.  She has not had any recent changes in any of her medications.  The patient had numerous episodes of dry heaves on 12/22/2017 which continued in the emergency department. In the emergency department, the patient was noted to be afebrile but hypotensive with blood pressure of 77/58.  She was saturating 100% on room air.  She was noted to have elevated LFTs with AST 97, ALT 51, alk phosphatase 85, total bilirubin 1.1 at the time of admission.  WBC was 10.2.  Lactic acid peaked at 3.9.  She was started on vancomycin and Zosyn after blood cultures and urine cultures were obtained.  Chest x-ray showed pulmonary fibrosis without any consolidations.  Notably, the patient developed vague right shoulder pain that  she described as moderate, intermittent, with a dull ache sensation.  Assessment/Plan: Sepsis -concerned about cholecystitis -general surgery consulted--discussed with Dr. Aviva Signs -continue zosyn -blood cultures remain neg -d/c vanco -PCT <0.10 -lactic acid peaked 4.2 -continue IVF -check Coags although this will be elevated due to pt being on Xarelto -hold xarelto for now as pt may need invasive procedures -check cortisol  Transaminasemia -multifactorial including ischemic hepatitis, cholecystitis, possible viral hepatitis -12/23/17 RUQ US--GB wall thickening with cholelithiasis, possible GB neck stone -12/24/17 CT abd--GB wall thickening with surrounding fluid--?cholecystitis vs ascites/anasarca -suspect main driver is ischemic hepatitis; pt had no prodrome for viral hepatitis -GI consult -viral hepatitis serologies ordered, but may be neg in acute setting -check hep C RNA, hep B DNA, CMV DNA, EBV DNA -lipase 26  AKI -due to sepsis and hemodynamic changes -renal US although CT abd did not show hydronephrosis -check Coags although this will be elevated due to pt being on Xarelto -Baseline creatinine 0.6-0.8 -d/c vanco  Elevated troponin -no chest pain -personally reviewed EKG--sinus, nonspecific T wave changes -due to demand ischemia -12/24/17 Echo--EF 45%, diffuse HK, G2DD, PASP 33  Right Shoulder pain/Transaminasemia -concerned about cholecystitis -work up as above  Fluid overload -lasix IV x 1  Cardiomyopathy -suspect related to Etoh, stress -12/24/17 Echo--EF 45%, diffuse HK, G2DD, PASP 33 -will ultimately need cardiology follow up  Pyuria -urine culture neg  Persistent atrial fibrillation -Holding atenolol secondary to hypotension -Continue flecainide -Hold rivaroxaban  Hyperglycemia -Hemoglobin A1c  Interstitial Lung Disease -stable on RA  Etoh Dependence -  12/24/17 pt revealed she drinks 6 pack of beer on sat and  sun -CIWA  Hyponatremia -due to AKI and defect in Na reabsorption    Disposition Plan:   remain in ICU  Family Communication:  No Family at bedside  Consultants:  none  Code Status:  FULL  DVT Prophylaxis:  rivaroxaban   Procedures: As Listed in Progress Note Above  Antibiotics: vanco 9/14>>> Zosyn 9/14>>>  The patient is critically ill with multiple organ systems failure and requires high complexity decision making for assessment and support, frequent evaluation and titration of therapies, application of advanced monitoring technologies and extensive interpretation of multiple databases.  Critical care time - 45 mins.     Subjective: Pt c/o nausea and epigastric pain.  No vomiting.  C/o sob without cough, hemoptysis, headache.  No f/c, dysuria, hematuria, diarrhea, hematochezia.  No rashes  Objective: Vitals:   12/24/17 0000 12/24/17 0400 12/24/17 0500 12/24/17 0600  BP:  133/79 120/81 (!) 103/57  Pulse:  75  70  Resp:  (!) _0 Temp: (!) 94.1 F (34.5 C)  97.6 F (36.4 C)   TempSrc: Axillary  Axillary   SpO2:  100%  98%  Weight:   84.9 kg   Height:        Intake/Output Summary (Last 24 hours) at 12/24/2017 0803 Last data filed at 12/24/2017 0701 Gross per 24 hour  Intake 4291.9 ml  Output 10 ml  Net 4281.9 ml   Weight change: 15 kg Exam:   General:  Pt is alert, follows commands appropriately, not in acute distress  HEENT: No icterus, No thrush, No neck mass, [place]/AT  Cardiovascular: RRR, S1/S2, no rubs, no gallops  Respiratory: bibasilar crackles, no wheeze  Abdomen: Soft/+BS, epigastric tender, non distended, no guarding  Extremities: 1 + LE edema, No lymphangitis, No petechiae, No rashes, no synovitis   Data Reviewed: I have personally reviewed following labs and imaging studies Basic Metabolic Panel: Recent Labs  Lab 12/22/17 1745 12/23/17 0405 12/23/17 2008 12/24/17 0346  NA 136 136 134* 131*  K 3.6 4.5 3.7 5.1  CL  103 108 108 104  CO2 21* 20* 16* 15*  GLUCOSE 162* 123* 142* 112*  BUN _1 CREATININE 1.15* 1.19* 1.86* 1.86*  CALCIUM 9.0 7.8* 7.5* 7.5*   Liver Function Tests: Recent Labs  Lab 12/22/17 1745 12/23/17 0405 12/23/17 2008 12/24/17 0346  AST 97* 270* 1,376* 3,441*  ALT 51* 187* 753* 1,547*  ALKPHOS 85 63 56 55  BILITOT 1.1 1.3* 1.2 2.7*  PROT 6.2* 5.0* 5.1* 5.2*  ALBUMIN 3.3* 2.7* 2.7* 2.8*   Recent Labs  Lab 12/23/17 0901  LIPASE 26   Recent Labs  Lab 12/23/17 2255  AMMONIA 20   Coagulation Profile: No results for input(s): INR, PROTIME in the last 168 hours. CBC: Recent Labs  Lab 12/22/17 1745 12/23/17 0405 12/23/17 2008 12/24/17 0126  WBC 10.2 8.2 10.7* 12.4*  NEUTROABS 7.6  --  7.6 9.8*  HGB 14.5 12.9 12.9 13.3  HCT 43.9 40.6 40.6 41.0  MCV 94.2 93.8 95.8 94.9  PLT 223 205 191 188   Cardiac Enzymes: Recent Labs  Lab 12/22/17 2033 12/23/17 0405 12/23/17 0901 12/23/17 2008 12/24/17 0126  CKTOTAL  --   --  22*  --   --   TROPONINI <0.03 <0.03 0.03* 0.03* 0.03*   BNP: Invalid input(s): POCBNP CBG: Recent Labs  Lab 12/23/17 1901  GLUCAP 126*   HbA1C: No results for  input(s): HGBA1C in the last 72 hours. Urine analysis:    Component Value Date/Time   COLORURINE AMBER (A) 12/22/2017 1838   APPEARANCEUR CLOUDY (A) 12/22/2017 1838   LABSPEC 1.017 12/22/2017 1838   PHURINE 5.0 12/22/2017 1838   GLUCOSEU 50 (A) 12/22/2017 1838   HGBUR NEGATIVE 12/22/2017 1838   BILIRUBINUR NEGATIVE 12/22/2017 1838   KETONESUR NEGATIVE 12/22/2017 1838   PROTEINUR >=300 (A) 12/22/2017 1838   UROBILINOGEN 1.0 02/04/2015 1533   NITRITE NEGATIVE 12/22/2017 1838   LEUKOCYTESUR NEGATIVE 12/22/2017 1838   Sepsis Labs: _0 (procalcitonin:4,lacticidven:4) ) Recent Results (from the past 240 hour(s))  Blood culture (routine x 2)     Status: None (Preliminary result)   Collection Time: 12/22/17  6:45 PM  Result Value Ref Range Status   Specimen  Description RIGHT ANTECUBITAL  Final   Special Requests   Final    BOTTLES DRAWN AEROBIC AND ANAEROBIC Blood Culture adequate volume   Culture   Final    NO GROWTH 2 DAYS Performed at [facility], [address], [place], [place] [address]    Report Status PENDING  Incomplete  Blood culture (routine x 2)     Status: None (Preliminary result)   Collection Time: 12/22/17  7:30 PM  Result Value Ref Range Status   Specimen Description RIGHT ANTECUBITAL  Final   Special Requests   Final    BOTTLES DRAWN AEROBIC ONLY Blood Culture adequate volume   Culture   Final    NO GROWTH 2 DAYS Performed at [facility], [address] [place], [place] [address]    Report Status PENDING  Incomplete  MRSA PCR Screening     Status: None   Collection Time: 12/22/17 11:18 PM  Result Value Ref Range Status   MRSA by PCR NEGATIVE NEGATIVE Final    Comment:        The GeneXpert MRSA Assay (FDA approved for NASAL specimens only), is one component of a comprehensive MRSA colonization surveillance program. It is not intended to diagnose MRSA infection nor to guide or monitor treatment for MRSA infections. Performed at [facility], [address], [place], [place] [address]   Culture, blood (x 2)     Status: None (Preliminary result)   Collection Time: 12/23/17 10:57 PM  Result Value Ref Range Status   Specimen Description BLOOD RIGHT HAND  Final   Special Requests   Final    BOTTLES DRAWN AEROBIC ONLY Blood Culture adequate volume   Culture   Final    NO GROWTH < 12 HOURS Performed at [facility], [address], [place], [place] [address]    Report Status PENDING  Incomplete  Culture, blood (x 2)     Status: None (Preliminary result)   Collection Time: 12/23/17 10:57 PM  Result Value Ref Range Status   Specimen Description RIGHT ANTECUBITAL  Final   Special Requests   Final    BOTTLES DRAWN AEROBIC AND ANAEROBIC Blood Culture results may not be optimal due to an inadequate volume of blood  received in culture bottles   Culture   Final    NO GROWTH < 12 HOURS Performed at [facility], [address], [place], [place] [address]    Report Status PENDING  Incomplete     Scheduled Meds: . flecainide  50 mg Oral Q12H  . metoCLOPramide (REGLAN) injection  5 mg Intravenous Q8H  . pantoprazole  40 mg Oral q morning - 10a  . sodium bicarbonate  50 mEq Intravenous Once  Continuous Infusions: . sodium chloride 75 mL/hr at 12/24/17 0801  . piperacillin-tazobactam (ZOSYN)  IV 3.375 g (12/24/17 0659)  .  sodium bicarbonate  infusion 1000 mL    . vancomycin Stopped (12/23/17 2123)    Procedures/Studies: Ct Abdomen Pelvis Wo Contrast  Result Date: 12/24/2017 CLINICAL DATA:  Cirrhosis fatty liver nausea vomiting and loose stools EXAM: CT ABDOMEN AND PELVIS WITHOUT CONTRAST TECHNIQUE: Multidetector CT imaging of the abdomen and pelvis was performed following the standard protocol without IV contrast. COMPARISON:  Ultrasound 12/23/2017, 03/09/2015 CT FINDINGS: Lower chest: Lung bases demonstrate small bilateral pleural effusions. Bibasilar atelectasis. Septal thickening suggesting mild edema. Postsurgical changes of the left breast. Cardiomegaly. Hepatobiliary: No focal hepatic abnormality. No biliary dilatation. Slight increased intraluminal density at the gallbladder. Wall thickening and surrounding fluid. Pancreas: Unremarkable. No pancreatic ductal dilatation or surrounding inflammatory changes. Spleen: Normal in size without focal abnormality. Adrenals/Urinary Tract: Adrenal glands are normal. No hydronephrosis. Perinephric fat stranding. Bladder contains a Foley catheter Stomach/Bowel: Stomach nonenlarged. No dilated small bowel. No colon wall thickening. Appendix not clearly identified. Vascular/Lymphatic: Moderate aortic atherosclerosis without aneurysm. No significantly enlarged lymph nodes. Reproductive: Uterus and bilateral adnexa are unremarkable. Other: No free air. Small amount of  ascites within the abdomen and pelvis. Diffuse body wall edema consistent with anasarca. Musculoskeletal: Scoliosis of the spine. Treated compression deformity at T11. Partial fusion L1-L2. Degenerative changes of the lumbar spine. Left hip replacement. IMPRESSION: 1. Negative for bowel obstruction or bowel wall thickening. 2. Gallbladder wall thickening with surrounding edema and or fluid, corresponding to thickened wall noted on sonography. Findings are nonspecific and could be seen in the setting of liver disease, acute or chronic cholecystitis, and edema forming states. If acute gallbladder disease remains a concern, further evaluation with hepatobiliary nuclear medicine imaging could be obtained. 3. Anasarca with small amount of ascites in the abdomen and pelvis. 4. Small pleural effusions and bibasilar atelectasis. Septal thickening at the bases suggesting edema. Electronically Signed   By: Donavan Foil M.D.   On: 12/24/2017 00:22   Dg Chest 2 View  Result Date: 12/22/2017 CLINICAL DATA:  77 y/o  F; chest pain with intermittent nausea. EXAM: CHEST - 2 VIEW COMPARISON:  03/28/2016 CT chest.  11/16/2014 chest radiograph. FINDINGS: Mild cardiomegaly. Calcific aortic atherosclerosis. Coarse reticular opacities greatest at the lung bases compatible pulmonary fibrosis. No consolidation, effusion, or pneumothorax. T12 chronic compression deformity. Bones are otherwise unremarkable. IMPRESSION: 1. Mild cardiomegaly. 2. Basilar predominant pulmonary fibrosis.  No focal consolidation. 3.  Aortic Atherosclerosis (ICD10-I70.0). 4. Chronic T12 compression deformity. Electronically Signed   By: Kristine Garbe M.D.   On: 12/22/2017 19:34   Ct Head Wo Contrast  Result Date: 12/23/2017 CLINICAL DATA:  Acute confusion. EXAM: CT HEAD WITHOUT CONTRAST TECHNIQUE: Contiguous axial images were obtained from the base of the skull through the vertex without intravenous contrast. COMPARISON:  None. FINDINGS: Brain:  Generalized atrophy. No sign of acute infarction. 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 Chronic small-vessel changes of the deep white matter. No mass lesion, hemorrhage, hydrocephalus or extra-axial collection. Vascular: There is atherosclerotic calcification of the major vessels at the base of the brain. Skull: Negative Sinuses/Orbits: Clear/normal Other: None IMPRESSION: No acute finding by CT. Atrophy and chronic small-vessel ischemic change. 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 Electronically Signed   By: Nelson Chimes M.D.   On: 12/23/2017 19:59   Dg Chest Port 1 View  Result Date: 12/23/2017 CLINICAL DATA:  Dyspnea and confusion  EXAM: PORTABLE CHEST 1 VIEW COMPARISON:  12/22/2017, 02/04/2015, CT chest 03/28/2016 FINDINGS: Cardiomegaly with vascular congestion, small pleural effusion and interstitial edema. Confluent opacity at the left base. Enlarged central pulmonary vessels. Aortic atherosclerosis. No pneumothorax. IMPRESSION: 1. Cardiomegaly with vascular congestion, small pleural effusion and mild increased interstitial opacity consistent with edema 2. Patchy consolidation at the left base may reflect atelectasis or superimposed pneumonia Electronically Signed   By: Donavan Foil M.D.   On: 12/23/2017 19:32   US Abdomen Limited Ruq  Result Date: 12/23/2017 CLINICAL DATA:  Nausea and vomiting for 1 day EXAM: ULTRASOUND ABDOMEN LIMITED RIGHT UPPER QUADRANT COMPARISON:  Abdominal CT 03/09/2015 FINDINGS: Gallbladder: Shadowing gallstone in the neck. There is generalized gallbladder wall thickening to 10 mm (this measurement includes pericholecystic fat) without focal tenderness or wall striation. Gallbladder is incompletely distended. Common bile duct: Diameter: 4 mm Liver: No focal lesion identified. Within normal limits in parenchymal echogenicity. Portal vein is patent on color Doppler imaging with normal direction of blood flow towards the  liver. Trace right pleural effusion IMPRESSION: Cholelithiasis. There is gallbladder wall thickening but this may be related to under distention of the gallbladder. No focal tenderness as would be expected for acute cholecystitis. Electronically Signed   By: Monte Fantasia M.D.   On: 12/23/2017 11:09    Orson Eva, DO  [facility] Pager [phone]  If 7PM-7AM, please contact night-coverage [email or web address] Password [email or web address] 12/24/2017, 8:03 AM   LOS: 2 days

## 2017-12-24 NOTE — Progress Notes (Signed)
CRITICAL VALUE ALERT  Critical Value: PT: 57.6,  INR: 6.65  Date & Time Notied: 12/24/17 @1110   Provider Notified: Dr. Carles Collet  Orders Received/Actions taken: No new orders currently

## 2017-12-24 NOTE — Care Management Important Message (Signed)
Important Message  Patient Details  Name: Karina Tran MRN: [id number] Date of Birth: 1941/02/17   Medicare Important Message Given:  Yes    [name] 12/24/2017, 10:35 AM

## 2017-12-24 NOTE — Consult Note (Signed)
Referring Provider: Dr. Carles Collet  Primary Care Physician:  Curly Rim, MD Primary Gastroenterologist:  Dr. Gala Romney    Date of Admission: 12/22/17 Date of Consultation: 12/24/17  Reason for Consultation:  Elevated LFTs   HPI:  Karina Tran is a 77 y.o. year old female presenting with sudden onset of weakness, nausea, vomiting, hypotensive in the 70 range in the ED, admitted with acute kidney injury, possible UTI, lactic acidosis, mildly elevated transaminases on admission with creatinine 1.15. Transaminases now with AST in the 5000s, ALT 2000s, bilirubin elevated at 2.8 along with direct/indirect elevated. Creatinine has trended up to 1.86. Decreasing urine output. Hypotension resolved.   States she has an [place] on Saturday and will be cancelling it. Was at a meeting for the cruise and laid down thereafter with weakness, felt "awful", dry heaves but no vomiting, diarrhea. +nausea has continued but improving overall. No abdominal pain. No fever but did note chills prior to admission. Feels sleepy and short of breath currently. No tylenol products prior to admission, no NSAIDs. Denies any OTC herbal supplements but takes fish oil and coQ10. No prior history of liver disease.   Only pertinent significant GI history in past was episodes of rectal bleeding in 2017 at [facility], s/p flex sig, colonoscopy, EGD per her report. She received blood transfusions at that time and underwent hemorrhoidectomy with resolution of bleeding.   Prior to acute illness, she has no similar symptoms. Drinks beer a few per week and sometimes will drink a 6 pack at a time, socially.   Events overnight reviewed. Hallucinating and delirious overnight, SOB. Today she is alert and oriented X 4.   Past Medical History:  Diagnosis Date  . Allergic rhinitis   . Breast cancer ([place])       . Dyspnea    chronic with exertion   . GERD (gastroesophageal reflux disease)   . 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 History of GI  bleed    last episode 02-02-16; was due to hemorroids   . History of kidney infection   . History of pulmonary hypertension    mild ; (see ECHO result in LOV with Dr. Kela Millin 06-18-17 on chart )  . HLD (hyperlipidemia)    denies   . Hypertension   . ILD (interstitial lung disease) ([place])   . Iron deficiency anemia    gets iv infusions  . Persistent atrial fibrillation ([place])    a. s/p PVI 2009 in [place]  . Raynaud phenomenon    no issues currently   . Spinal stenosis    lumbar     Past Surgical History:  Procedure Laterality Date  . ABLATION  2009   PVI in [place]  . afib ablation  10/06/2010  . APPENDECTOMY    . BREAST LUMPECTOMY Left 2007   malignant  . BREAST SURGERY Left 2007   partial mastectomy and lumpectomy   . CARDIOVERSION N/A 11/18/2014   Procedure: CARDIOVERSION;  Surgeon: Adrian Prows, MD;  Location: [facility];  Service: Cardiovascular;  Laterality: N/A;  . carpel tunnel    . CESAREAN SECTION    . GANGLION CYST EXCISION    . HEMORRHOID SURGERY    . KYPHOPLASTY  2017  . TONSILLECTOMY    . TOTAL HIP ARTHROPLASTY Left 07/03/2017   Procedure: LEFT TOTAL HIP ARTHROPLASTY ANTERIOR APPROACH;  Surgeon: Paralee Cancel, MD;  Location: [facility];  Service: Orthopedics;  Laterality: Left;  70 mins    Prior to Admission medications  Medication Sig Start Date End Date Taking? Authorizing Provider  amLODipine (NORVASC) 5 MG tablet Take 5 mg by mouth every morning.  02/19/16  Yes [provider]  atenolol (TENORMIN) 50 MG tablet Take 50 mg by mouth daily.   Yes [provider]  Calcium Carb-Cholecalciferol (CALCIUM 600+D3 PO) Take 1 tablet by mouth every morning.    Yes [provider]  Coenzyme Q10 (CO Q10) 100 MG CAPS Take 100 mg by mouth every morning.    Yes [provider]  flecainide (TAMBOCOR) 50 MG tablet Take 1 tablet (50 mg total) by mouth every 12 (twelve) hours. Patient taking differently: Take 100 mg by mouth every morning.  11/18/14   Yes Neldon Labella, NP  gabapentin (NEURONTIN) 300 MG capsule Take 300 mg by mouth at bedtime.    Yes [provider]  hydrochlorothiazide (HYDRODIURIL) 25 MG tablet Take 25 mg by mouth daily.   Yes [provider]  pantoprazole (PROTONIX) 40 MG tablet Take 40 mg by mouth every morning.    Yes [provider]  potassium gluconate 595 (99 K) MG TABS tablet Take 595 mg by mouth daily.   Yes [provider]  rivaroxaban (XARELTO) 20 MG TABS tablet Take 1 tablet (20 mg total) by mouth daily with supper. Patient taking differently: Take 20 mg by mouth every morning.  11/18/14  Yes Neldon Labella, NP  vitamin E 400 UNIT capsule Take 400 Units by mouth every morning.    Yes [provider]    Current Facility-Administered Medications  Medication Dose Route Frequency Provider Last Rate Last Dose  . 0.9 %  sodium chloride infusion   Intravenous Continuous Tat, Shanon Brow, MD 75 mL/hr at 12/24/17 0801    . flecainide (TAMBOCOR) tablet 50 mg  50 mg Oral Q12H Shah, Pratik D, DO   50 mg at 12/24/17 0946  . metoCLOPramide (REGLAN) injection 5 mg  5 mg Intravenous Franco Collet, MD   5 mg at 12/24/17 0659  . ondansetron (ZOFRAN) injection 4 mg  4 mg Intravenous Q6H PRN Manuella Ghazi, Pratik D, DO   4 mg at 12/23/17 2057  . pantoprazole (PROTONIX) EC tablet 40 mg  40 mg Oral q morning - 10a Shah, Pratik D, DO   40 mg at 12/24/17 0946  . piperacillin-tazobactam (ZOSYN) IVPB 3.375 g  3.375 g Intravenous Franco Collet, MD 12.5 mL/hr at 12/24/17 0801    . promethazine (PHENERGAN) injection 12.5 mg  12.5 mg Intravenous Q6H PRN Tat, Shanon Brow, MD   12.5 mg at 12/23/17 1717  . sodium bicarbonate 150 mEq in dextrose 5 % 1,000 mL infusion   Intravenous Continuous Tat, Shanon Brow, MD 50 mL/hr at 12/24/17 0949      Allergies as of 12/22/2017 - Review Complete 12/22/2017  Allergen Reaction Noted  . Aspirin Other (See Comments) 06/25/2014  . Blue dyes (parenteral) Nausea And Vomiting  07/03/2017  . Demerol [meperidine] Nausea And Vomiting 06/25/2014    Family History  Problem Relation Age of Onset  . Lung cancer Sister   . Heart failure Mother   . Brain cancer Father   . Lung disease Neg Hx   . Rheumatologic disease Neg Hx   . Colon cancer Neg Hx   . Colon polyps Neg Hx     Social History   Socioeconomic History  . Marital status: Widowed    Spouse name: Not on file  . Number of children: Not on file  . Years of education: Not on file  .  Highest education level: Not on file  Occupational History  . Not on file  Social Needs  . Financial resource strain: Not on file  . Food insecurity:    Worry: Not on file    Inability: Not on file  . Transportation needs:    Medical: Not on file    Non-medical: Not on file  Tobacco Use  . Smoking status: Passive Smoke Exposure - Never Smoker  . Smokeless tobacco: Never Used  . Tobacco comment: exposed to second-hand smoke her entire life  Substance and Sexual Activity  . Alcohol use: Yes    Comment: social ETOH, beer   . Drug use: No  . Sexual activity: Not on file  Lifestyle  . Physical activity:    Days per week: Not on file    Minutes per session: Not on file  . Stress: Not on file  Relationships  . Social connections:    Talks on phone: Not on file    Gets together: Not on file    Attends religious service: Not on file    Active member of club or organization: Not on file    Attends meetings of clubs or organizations: Not on file    Relationship status: Not on file  . Intimate partner violence:    Fear of current or ex partner: Not on file    Emotionally abused: Not on file    Physically abused: Not on file    Forced sexual activity: Not on file  Other Topics Concern  . Not on file  Social History Narrative   Live with daughter. Retired.       [facility]:   Originally from [place]. Moved to [place] in 2015. Living with daughter & son-in-law. Remote travel to [place]. 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 No pets currently. Remote parakeet exposure. No mold exposure. Previously enjoyed square dancing and now plays on her computer. Also previously enjoyed camping in her camper but has since sold it.     Review of Systems: As mentioned in HPI   Physical Exam: Vital signs in last 24 hours: Temp:  [94.1 F (34.5 C)-98.2 F (36.8 C)] 98.2 F (36.8 C) (09/16 0800) Pulse Rate:  [70-92] 79 (09/16 0800) Resp:  [15-31] 27 (09/16 0800) BP: (82-144)/(49-98) 131/69 (09/16 0800) SpO2:  [98 %-100 %] 100 % (09/16 0800) Weight:  [84.9 kg] 84.9 kg (09/16 0500) Last BM Date: 12/23/17 General:   Alert, well-developed, appears acutely ill but oriented X 4 Head:  Normocephalic and atraumatic. Eyes:  Sclera clear, no icterus.   Ears:  Normal auditory acuity. Nose:  No deformity, discharge,  or lesions. Mouth:  No deformity or lesions, dentition normal. Lungs:  Clear throughout to auscultation.    Heart:  S1 S2 present, irregularly irregular  Abdomen:  Soft, nontender and nondistended. No masses, hepatosplenomegaly or hernias noted. Normal bowel sounds, without guarding, and without rebound.   Rectal:  Deferred  Msk:  Symmetrical without gross deformities. Normal posture. Extremities:  Without edema. Neurologic:  Alert and  oriented x4 Psych:  Alert and cooperative. Normal mood and affect.  Intake/Output from previous day: 09/15 0701 - 09/16 0700 In: 3231.8 [P.O.:300; I.V.:1667.6; IV Piggyback:1264.1] Out: 10 [Urine:10] Intake/Output this shift: Total I/O In: 1187.1 [I.V.:1116.8; IV Piggyback:70.3] Out: -   Lab Results: Recent Labs    12/23/17 0405 12/23/17 2008 12/24/17 0126  WBC 8.2 10.7* 12.4*  HGB 12.9 12.9 13.3  HCT  40.6 40.6 41.0  PLT 205 191 188   BMET Recent Labs    12/23/17 0405 12/23/17 2008 12/24/17 0346  NA 136 134* 131*  K 4.5 3.7 5.1  CL 108 108 104  CO2 20* 16* 15*   GLUCOSE 123* 142* 112*  BUN 12 16 16   CREATININE 1.19* 1.86* 1.86*  CALCIUM 7.8* 7.5* 7.5*   LFT Recent Labs    12/23/17 2008 12/24/17 0346 12/24/17 0725  PROT 5.1* 5.2* 5.2*  ALBUMIN 2.7* 2.8* 2.8*  AST 1,376* 3,441* 5,226*  ALT 753* 1,547* 2,200*  ALKPHOS 56 55 53  BILITOT 1.2 2.7* 2.8*  BILIDIR  --   --  1.2*  IBILI  --   --  1.6*   Lab Results  Component Value Date   INR 6.65 (HH) 12/24/2017   INR 1.17 03/12/2017   INR 1.12 03/09/2015     Studies/Results: Ct Abdomen Pelvis Wo Contrast  Result Date: 12/24/2017 CLINICAL DATA:  Cirrhosis fatty liver nausea vomiting and loose stools EXAM: CT ABDOMEN AND PELVIS WITHOUT CONTRAST TECHNIQUE: Multidetector CT imaging of the abdomen and pelvis was performed following the standard protocol without IV contrast. COMPARISON:  Ultrasound 12/23/2017, 03/09/2015 CT FINDINGS: Lower chest: Lung bases demonstrate small bilateral pleural effusions. Bibasilar atelectasis. Septal thickening suggesting mild edema. Postsurgical changes of the left breast. Cardiomegaly. Hepatobiliary: No focal hepatic abnormality. No biliary dilatation. Slight increased intraluminal density at the gallbladder. Wall thickening and surrounding fluid. Pancreas: Unremarkable. No pancreatic ductal dilatation or surrounding inflammatory changes. Spleen: Normal in size without focal abnormality. Adrenals/Urinary Tract: Adrenal glands are normal. No hydronephrosis. Perinephric fat stranding. Bladder contains a Foley catheter Stomach/Bowel: Stomach nonenlarged. No dilated small bowel. No colon wall thickening. Appendix not clearly identified. Vascular/Lymphatic: Moderate aortic atherosclerosis without aneurysm. No significantly enlarged lymph nodes. Reproductive: Uterus and bilateral adnexa are unremarkable. Other: No free air. Small amount of ascites within the abdomen and pelvis. Diffuse body wall edema consistent with anasarca. Musculoskeletal: Scoliosis of the spine. Treated  compression deformity at T11. Partial fusion L1-L2. Degenerative changes of the lumbar spine. Left hip replacement. IMPRESSION: 1. Negative for bowel obstruction or bowel wall thickening. 2. Gallbladder wall thickening with surrounding edema and or fluid, corresponding to thickened wall noted on sonography. Findings are nonspecific and could be seen in the setting of liver disease, acute or chronic cholecystitis, and edema forming states. If acute gallbladder disease remains a concern, further evaluation with hepatobiliary nuclear medicine imaging could be obtained. 3. Anasarca with small amount of ascites in the abdomen and pelvis. 4. Small pleural effusions and bibasilar atelectasis. Septal thickening at the bases suggesting edema. Electronically Signed   By: Donavan Foil M.D.   On: 12/24/2017 00:22   Dg Chest 2 View  Result Date: 12/22/2017 CLINICAL DATA:  77 y/o  F; chest pain with intermittent nausea. EXAM: CHEST - 2 VIEW COMPARISON:  03/28/2016 CT chest.  11/16/2014 chest radiograph. FINDINGS: Mild cardiomegaly. Calcific aortic atherosclerosis. Coarse reticular opacities greatest at the lung bases compatible pulmonary fibrosis. No consolidation, effusion, or pneumothorax. T12 chronic compression deformity. Bones are otherwise unremarkable. IMPRESSION: 1. Mild cardiomegaly. 2. Basilar predominant pulmonary fibrosis.  No focal consolidation. 3.  Aortic Atherosclerosis (ICD10-I70.0). 4. Chronic T12 compression deformity. Electronically Signed   By: Kristine Garbe M.D.   On: 12/22/2017 19:34   Ct Head Wo Contrast  Result Date: 12/23/2017 CLINICAL DATA:  Acute confusion. EXAM: CT HEAD WITHOUT CONTRAST TECHNIQUE: Contiguous axial images were obtained from the base of the skull through the  vertex without intravenous contrast. COMPARISON:  None. FINDINGS: Brain: Generalized atrophy. No sign of acute infarction. 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 Chronic small-vessel  changes of the deep white matter. No mass lesion, hemorrhage, hydrocephalus or extra-axial collection. Vascular: There is atherosclerotic calcification of the major vessels at the base of the brain. Skull: Negative Sinuses/Orbits: Clear/normal Other: None IMPRESSION: No acute finding by CT. Atrophy and chronic small-vessel ischemic change. 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 Electronically Signed   By: Nelson Chimes M.D.   On: 12/23/2017 19:59   US Renal  Result Date: 12/24/2017 CLINICAL DATA:  Acute renal insufficiency EXAM: RENAL ULTRASOUND COMPARISON:  CT abdomen and pelvis December 23, 2017 FINDINGS: Right Kidney: Length: 10.9 cm. Echogenicity and renal cortical thickness are within normal limits. No mass, perinephric fluid, or hydronephrosis visualized. No sonographically demonstrable calculus or ureterectasis. Left Kidney: Length: 10.7 cm. Echogenicity and renal cortical thickness are within normal limits. No perinephric fluid or hydronephrosis visualized. There is a cyst arising from the lower pole left kidney measuring 1.7 x 1.7 x 1.0 cm. No sonographically demonstrable calculus or ureterectasis. Bladder: Decompressed with Foley catheter and cannot be assessed. There is ascites.  There is a left pleural effusion. IMPRESSION: Small left renal cyst. Kidneys otherwise appear unremarkable bilaterally. Urinary bladder is decompressed and cannot be assessed at this time. There is ascites as well as a left pleural effusion evident. Electronically Signed   By: Lowella Grip III M.D.   On: 12/24/2017 10:41   Dg Chest Port 1 View  Result Date: 12/23/2017 CLINICAL DATA:  Dyspnea and confusion EXAM: PORTABLE CHEST 1 VIEW COMPARISON:  12/22/2017, 02/04/2015, CT chest 03/28/2016 FINDINGS: Cardiomegaly with vascular congestion, small pleural effusion and interstitial edema. Confluent opacity at the left base. Enlarged central pulmonary vessels. Aortic atherosclerosis. No  pneumothorax. IMPRESSION: 1. Cardiomegaly with vascular congestion, small pleural effusion and mild increased interstitial opacity consistent with edema 2. Patchy consolidation at the left base may reflect atelectasis or superimposed pneumonia Electronically Signed   By: Donavan Foil M.D.   On: 12/23/2017 19:32   US Abdomen Limited Ruq  Result Date: 12/23/2017 CLINICAL DATA:  Nausea and vomiting for 1 day EXAM: ULTRASOUND ABDOMEN LIMITED RIGHT UPPER QUADRANT COMPARISON:  Abdominal CT 03/09/2015 FINDINGS: Gallbladder: Shadowing gallstone in the neck. There is generalized gallbladder wall thickening to 10 mm (this measurement includes pericholecystic fat) without focal tenderness or wall striation. Gallbladder is incompletely distended. Common bile duct: Diameter: 4 mm Liver: No focal lesion identified. Within normal limits in parenchymal echogenicity. Portal vein is patent on color Doppler imaging with normal direction of blood flow towards the liver. Trace right pleural effusion IMPRESSION: Cholelithiasis. There is gallbladder wall thickening but this may be related to under distention of the gallbladder. No focal tenderness as would be expected for acute cholecystitis. Electronically Signed   By: Monte Fantasia M.D.   On: 12/23/2017 11:09    Impression: 77 year old admitted with sepsis, found to have increasing transaminases markedly elevated and likely related to ischemic hepatitis but further viral serologies pending; notably, her SBP was in the 70s on admission. She was on Xarelto prior to admission for history of afib, but coagulopathy noted with INR first checked this morning and 6.65. No known history of liver disease. Although she drinks alcohol, likely ischemic etiology in setting of acute illness.  Will go ahead and give Vit K now, rechecking INR in am. Follow LFTs and expect to plateau but may have a lag  in bilirubin. Not a transplant candidate at this time. Await pending viral serologies. May  need to check autoimmune labs as well although less likely the case. Does not appear to be biliary etiology.   Plan: Follow INR, give Vit K now  Monitor for encephalopathy, acute mental status changes Follow-up on pending hepatitis serologies; may need further evaluation with autoimmune work-up Empiric antibiotics on board Repeat HFP, INR in am Will continue to follow with you  Annitta Needs, PhD, ANP-BC [facility]     LOS: 2 days    12/24/2017, 12:46 PM

## 2017-12-24 NOTE — Consult Note (Signed)
Reason for Consult: Cholelithiasis, elevated liver enzyme tests Referring Physician: Dr. Riesa Pope Karina Tran is an 77 y.o. female.  HPI: Patient is a 77 year old white female who I saw her earlier today at the request of Dr. Carles Collet for cholelithiasis.  She was admitted to the hospital yesterday with generalized malaise.  She denies any right upper quadrant abdominal pain, fever, chills, nausea, vomiting.  She states she has never had right upper quadrant abdominal pain or fatty food intolerance.  She does drink a sixpack of beer a day.  She was noted to have significantly elevated liver enzyme tests.  Ultrasound gallbladder reveals cholelithiasis with one stone towards the neck of the gallbladder.  The common bile duct was within normal limits.  Gallbladder wall thickening was noted, but no pericholecystic fluid was seen.  Since her admission, her liver enzyme tests have significantly increased.  She now has an INR of 6.65.  She has had elevated lactic acid levels.  She denies any abdominal pain.  Past Medical History:  Diagnosis Date  . Allergic rhinitis   . Breast cancer ([place])       . Dyspnea    chronic with exertion   . GERD (gastroesophageal reflux disease)   . 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 History of GI bleed    last episode 02-02-16; was due to hemorroids   . History of kidney infection   . History of pulmonary hypertension    mild ; (see ECHO result in LOV with Dr. Kela Millin 06-18-17 on chart )  . HLD (hyperlipidemia)    denies   . Hypertension   . ILD (interstitial lung disease) ([place])   . Iron deficiency anemia    gets iv infusions  . Persistent atrial fibrillation ([place])    a. s/p PVI 2009 in [place]  . Raynaud phenomenon    no issues currently   . Spinal stenosis    lumbar     Past Surgical History:  Procedure Laterality Date  . ABLATION  2009   PVI in [place]  . afib ablation  10/06/2010  . APPENDECTOMY    . BREAST LUMPECTOMY Left 2007   malignant  . BREAST SURGERY Left  2007   partial mastectomy and lumpectomy   . CARDIOVERSION N/A 11/18/2014   Procedure: CARDIOVERSION;  Surgeon: Adrian Prows, MD;  Location: [facility];  Service: Cardiovascular;  Laterality: N/A;  . carpel tunnel    . CESAREAN SECTION    . GANGLION CYST EXCISION    . HEMORRHOID SURGERY    . KYPHOPLASTY  2017  . TONSILLECTOMY    . TOTAL HIP ARTHROPLASTY Left 07/03/2017   Procedure: LEFT TOTAL HIP ARTHROPLASTY ANTERIOR APPROACH;  Surgeon: Paralee Cancel, MD;  Location: [facility];  Service: Orthopedics;  Laterality: Left;  70 mins    Family History  Problem Relation Age of Onset  . Lung cancer Sister   . Heart failure Mother   . Brain cancer Father   . Lung disease Neg Hx   . Rheumatologic disease Neg Hx   . Colon cancer Neg Hx   . Colon polyps Neg Hx     Social History:  reports that she is a non-smoker but has been exposed to tobacco smoke. She has never used smokeless tobacco. She reports that she drinks alcohol. She reports that she does not use drugs.  Allergies:  Allergies  Allergen Reactions  . Aspirin Other (See Comments)    Bloody noses  . Blue Dyes (Parenteral) Nausea And  Vomiting  . Demerol [Meperidine] Nausea And Vomiting    Medications: I have reviewed the patient's current medications.  Results for orders placed or performed during the hospital encounter of 12/22/17 (from the past 48 hour(s))  CBC with Differential     Status: None   Collection Time: 12/22/17  5:45 PM  Result Value Ref Range   WBC 10.2 4.0 - 10.5 K/uL   RBC 4.66 3.87 - 5.11 MIL/uL   Hemoglobin 14.5 12.0 - 15.0 g/dL   HCT 43.9 36.0 - 46.0 %   MCV 94.2 78.0 - 100.0 fL   MCH 31.1 26.0 - 34.0 pg   MCHC 33.0 30.0 - 36.0 g/dL   RDW 15.5 11.5 - 15.5 %   Platelets 223 150 - 400 K/uL   Neutrophils Relative % 75 %   Neutro Abs 7.6 1.7 - 7.7 K/uL   Lymphocytes Relative 15 %   Lymphs Abs 1.5 0.7 - 4.0 K/uL   Monocytes Relative 9 %   Monocytes Absolute 1.0 0.1 - 1.0 K/uL   Eosinophils Relative 1 %    Eosinophils Absolute 0.1 0.0 - 0.7 K/uL   Basophils Relative 0 %   Basophils Absolute 0.0 0.0 - 0.1 K/uL    Comment: Performed at [facility] [address], [place], [place] [address]  Comprehensive metabolic panel     Status: Abnormal   Collection Time: 12/22/17  5:45 PM  Result Value Ref Range   Sodium 136 135 - 145 mmol/L   Potassium 3.6 3.5 - 5.1 mmol/L   Chloride 103 98 - 111 mmol/L   CO2 21 (L) 22 - 32 mmol/L   Glucose, Bld 162 (H) 70 - 99 mg/dL   BUN 10 8 - 23 mg/dL   Creatinine, Ser 1.15 (H) 0.44 - 1.00 mg/dL   Calcium 9.0 8.9 - 10.3 mg/dL   Total Protein 6.2 (L) 6.5 - 8.1 g/dL   Albumin 3.3 (L) 3.5 - 5.0 g/dL   AST 97 (H) 15 - 41 U/L   ALT 51 (H) 0 - 44 U/L   Alkaline Phosphatase 85 38 - 126 U/L   Total Bilirubin 1.1 0.3 - 1.2 mg/dL   GFR calc non Af Amer 44 (L) >60 mL/min   GFR calc Af Amer 51 (L) >60 mL/min    Comment: (NOTE) The eGFR has been calculated using the CKD EPI equation. This calculation has not been validated in all clinical situations. eGFR's persistently <60 mL/min signify possible Chronic Kidney Disease.    Anion gap 12 5 - 15    Comment: Performed at [facility], [address] [place], [place] [address]  Troponin I     Status: None   Collection Time: 12/22/17  5:45 PM  Result Value Ref Range   Troponin I <0.03 <0.03 ng/mL    Comment: Performed at [facility], [address], [place],  [address]  Urinalysis, Routine w reflex microscopic     Status: Abnormal   Collection Time: 12/22/17  6:38 PM  Result Value Ref Range   Color, Urine AMBER (A) YELLOW    Comment: BIOCHEMICALS MAY BE AFFECTED BY COLOR   APPearance CLOUDY (A) CLEAR   Specific Gravity, Urine 1.017 1.005 - 1.030   pH 5.0 5.0 - 8.0   Glucose, UA 50 (A) NEGATIVE mg/dL   Hgb urine dipstick NEGATIVE NEGATIVE   Bilirubin Urine NEGATIVE NEGATIVE   Ketones, ur NEGATIVE NEGATIVE mg/dL   Protein, ur >=300 (A) NEGATIVE mg/dL   Nitrite NEGATIVE NEGATIVE  Leukocytes, UA NEGATIVE  NEGATIVE   RBC / HPF 0-5 0 - 5 RBC/hpf   WBC, UA 11-20 0 - 5 WBC/hpf   Bacteria, UA RARE (A) NONE SEEN   Squamous Epithelial / LPF 0-5 0 - 5   WBC Clumps PRESENT    Mucus PRESENT    Hyaline Casts, UA PRESENT     Comment: Performed at [facility], [address] [place], [place] [address]  Lactic acid, plasma     Status: Abnormal   Collection Time: 12/22/17  6:45 PM  Result Value Ref Range   Lactic Acid, Venous 3.3 (HH) 0.5 - 1.9 mmol/L    Comment: CRITICAL RESULT CALLED TO, READ BACK BY AND VERIFIED WITH: POINDEXTER,M AT 2053 ON 9.14.2019 BY ISLEY,B Performed at [facility], [address], [place], [place] [address]   Blood culture (routine x 2)     Status: None (Preliminary result)   Collection Time: 12/22/17  6:45 PM  Result Value Ref Range   Specimen Description RIGHT ANTECUBITAL    Special Requests      BOTTLES DRAWN AEROBIC AND ANAEROBIC Blood Culture adequate volume   Culture      NO GROWTH 2 DAYS Performed at [facility], [address] [place], [place] [address]    Report Status PENDING   Cortisol-am, blood     Status: Abnormal   Collection Time: 12/22/17  6:45 PM  Result Value Ref Range   Cortisol - AM 42.7 (H) 6.7 - 22.6 ug/dL    Comment: Performed at [facility], [place] [address] [place], [place] [address]  Blood culture (routine x 2)     Status: None (Preliminary result)   Collection Time: 12/22/17  7:30 PM  Result Value Ref Range   Specimen Description RIGHT ANTECUBITAL    Special Requests      BOTTLES DRAWN AEROBIC ONLY Blood Culture adequate volume   Culture      NO GROWTH 2 DAYS Performed at [facility], [address], [place], [place] [address]    Report Status PENDING   Lactic acid, plasma     Status: Abnormal   Collection Time: 12/22/17  8:33 PM  Result Value Ref Range   Lactic Acid, Venous 3.9 (HH) 0.5 - 1.9 mmol/L    Comment: CRITICAL RESULT CALLED TO, READ BACK BY AND VERIFIED WITH: TUTTLE,A AT 2106 ON 9.14.2019 BY ISLEY,B Performed at  [facility], [address] [place], [place] [address]   Troponin I     Status: None   Collection Time: 12/22/17  8:33 PM  Result Value Ref Range   Troponin I <0.03 <0.03 ng/mL    Comment: Performed at [facility], [address] [place], [place] [address]  Urine culture     Status: None   Collection Time: 12/22/17  9:31 PM  Result Value Ref Range   Specimen Description      URINE, RANDOM Performed at [facility], [address], [place], [place] [address]    Special Requests      NONE Performed at [facility], [address], [place], [place] [address]    Culture      NO GROWTH Performed at [facility], [place] [address] [place], [place] [address]    Report Status 12/24/2017 FINAL   MRSA PCR Screening     Status: None   Collection Time: 12/22/17 11:18 PM  Result Value Ref Range   MRSA by PCR NEGATIVE NEGATIVE    Comment:  The GeneXpert MRSA Assay (FDA approved for NASAL specimens only), is one component of a comprehensive MRSA colonization surveillance program. It is not intended to diagnose MRSA infection nor to guide or monitor treatment for MRSA infections. Performed at [facility], [address], [place], [place] [address]   Comprehensive metabolic panel     Status: Abnormal   Collection Time: 12/23/17  4:05 AM  Result Value Ref Range   Sodium 136 135 - 145 mmol/L   Potassium 4.5 3.5 - 5.1 mmol/L    Comment: DELTA CHECK NOTED NO VISIBLE HEMOLYSIS    Chloride 108 98 - 111 mmol/L   CO2 20 (L) 22 - 32 mmol/L   Glucose, Bld 123 (H) 70 - 99 mg/dL   BUN 12 8 - 23 mg/dL   Creatinine, Ser 1.19 (H) 0.44 - 1.00 mg/dL   Calcium 7.8 (L) 8.9 - 10.3 mg/dL   Total Protein 5.0 (L) 6.5 - 8.1 g/dL   Albumin 2.7 (L) 3.5 - 5.0 g/dL   AST 270 (H) 15 - 41 U/L   ALT 187 (H) 0 - 44 U/L   Alkaline Phosphatase 63 38 - 126 U/L   Total Bilirubin 1.3 (H) 0.3 - 1.2 mg/dL   GFR calc non Af Amer 43 (L) >60 mL/min   GFR calc Af Amer 49 (L) >60 mL/min    Comment:  (NOTE) The eGFR has been calculated using the CKD EPI equation. This calculation has not been validated in all clinical situations. eGFR's persistently <60 mL/min signify possible Chronic Kidney Disease.    Anion gap 8 5 - 15    Comment: Performed at [facility], [address] [place], [place] [address]  CBC     Status: None   Collection Time: 12/23/17  4:05 AM  Result Value Ref Range   WBC 8.2 4.0 - 10.5 K/uL   RBC 4.33 3.87 - 5.11 MIL/uL   Hemoglobin 12.9 12.0 - 15.0 g/dL   HCT 40.6 36.0 - 46.0 %   MCV 93.8 78.0 - 100.0 fL   MCH 29.8 26.0 - 34.0 pg   MCHC 31.8 30.0 - 36.0 g/dL   RDW 15.4 11.5 - 15.5 %   Platelets 205 150 - 400 K/uL    Comment: Performed at [facility], [address], [place], [place] [address]  Troponin I     Status: None   Collection Time: 12/23/17  4:05 AM  Result Value Ref Range   Troponin I <0.03 <0.03 ng/mL    Comment: Performed at [facility], [address] [place], [place] [address]  Lactic acid, plasma     Status: Abnormal   Collection Time: 12/23/17  4:06 AM  Result Value Ref Range   Lactic Acid, Venous 2.9 (HH) 0.5 - 1.9 mmol/L    Comment: CRITICAL RESULT CALLED TO, READ BACK BY AND VERIFIED WITH: WAGNER,R AT 0525 ON 12/23/2017 BY MOSLEY,J Performed at [facility], [address] [place], [place] [address]   Procalcitonin     Status: None   Collection Time: 12/23/17  4:06 AM  Result Value Ref Range   Procalcitonin <0.10 ng/mL    Comment:        Interpretation: PCT (Procalcitonin) <= 0.5 ng/mL: Systemic infection (sepsis) is not likely. Local bacterial infection is possible. (NOTE)       Sepsis PCT Algorithm           Lower Respiratory Tract  Infection PCT Algorithm    ----------------------------     ----------------------------         PCT < 0.25 ng/mL                PCT < 0.10 ng/mL         Strongly encourage             Strongly discourage   discontinuation of antibiotics    initiation of  antibiotics    ----------------------------     -----------------------------       PCT 0.25 - 0.50 ng/mL            PCT 0.10 - 0.25 ng/mL               OR       >80% decrease in PCT            Discourage initiation of                                            antibiotics      Encourage discontinuation           of antibiotics    ----------------------------     -----------------------------         PCT >= 0.50 ng/mL              PCT 0.26 - 0.50 ng/mL               AND        <80% decrease in PCT             Encourage initiation of                                             antibiotics       Encourage continuation           of antibiotics    ----------------------------     -----------------------------        PCT >= 0.50 ng/mL                  PCT > 0.50 ng/mL               AND         increase in PCT                  Strongly encourage                                      initiation of antibiotics    Strongly encourage escalation           of antibiotics                                     -----------------------------                                           PCT <= 0.25 ng/mL  OR                                        > 80% decrease in PCT                                     Discontinue / Do not initiate                                             antibiotics Performed at [facility], [address], [place], [place] [address]   Troponin I     Status: Abnormal   Collection Time: 12/23/17  9:01 AM  Result Value Ref Range   Troponin I 0.03 (HH) <0.03 ng/mL    Comment: CRITICAL RESULT CALLED TO, READ BACK BY AND VERIFIED WITH: SHELTON,A AT [place] ON 12/23/2017 BY MOSLEY,J Performed at [facility], [address] [place], [place] [address]   Hemoglobin A1c     Status: None   Collection Time: 12/23/17  9:01 AM  Result Value Ref Range   Hgb A1c MFr Bld 5.4 4.8 - 5.6 %    Comment: (NOTE)         Prediabetes: 5.7 - 6.4          Diabetes: >6.4         Glycemic control for adults with diabetes: <7.0    Mean Plasma Glucose 108 mg/dL    Comment: (NOTE) Performed At: [facility] [place], [place] [id number] Rush Farmer MD BS:[id number]   CK     Status: Abnormal   Collection Time: 12/23/17  9:01 AM  Result Value Ref Range   Total CK 22 (L) 38 - 234 U/L    Comment: Performed at [facility], [address], [place], [place] [address]  Lactic acid, plasma     Status: Abnormal   Collection Time: 12/23/17  9:01 AM  Result Value Ref Range   Lactic Acid, Venous 3.0 (HH) 0.5 - 1.9 mmol/L    Comment: CRITICAL VALUE NOTED.  VALUE IS CONSISTENT WITH PREVIOUSLY REPORTED AND CALLED VALUE. Performed at [facility], [address] [place] [address] [place], [place] [address]   Lipase, blood     Status: None   Collection Time: 12/23/17  9:01 AM  Result Value Ref Range   Lipase 26 11 - 51 U/L    Comment: Performed at [facility], [address] [place],  [address]  Glucose, capillary     Status: Abnormal   Collection Time: 12/23/17  7:01 PM  Result Value Ref Range   Glucose-Capillary 126 (H) 70 - 99 mg/dL  CBC with Differential/Platelet     Status: Abnormal   Collection Time: 12/23/17  8:08 PM  Result Value Ref Range   WBC 10.7 (H) 4.0 - 10.5 K/uL   RBC 4.24 3.87 - 5.11 MIL/uL   Hemoglobin 12.9 12.0 - 15.0 g/dL   HCT 40.6 36.0 - 46.0 %   MCV 95.8 78.0 - 100.0 fL   MCH 30.4 26.0 - 34.0 pg   MCHC 31.8 30.0 - 36.0 g/dL   RDW 15.6 (H) 11.5 - 15.5 %   Platelets 191 150 - 400 K/uL   Neutrophils Relative % 71 %  Neutro Abs 7.6 1.7 - 7.7 K/uL   Lymphocytes Relative 15 %   Lymphs Abs 1.6 0.7 - 4.0 K/uL   Monocytes Relative 13 %   Monocytes Absolute 1.3 (H) 0.1 - 1.0 K/uL   Eosinophils Relative 1 %   Eosinophils Absolute 0.1 0.0 - 0.7 K/uL   Basophils Relative 0 %   Basophils Absolute 0.0 0.0 - 0.1 K/uL    Comment: Performed at [facility], [address], [place], [place] [address]  Comprehensive  metabolic panel     Status: Abnormal   Collection Time: 12/23/17  8:08 PM  Result Value Ref Range   Sodium 134 (L) 135 - 145 mmol/L   Potassium 3.7 3.5 - 5.1 mmol/L   Chloride 108 98 - 111 mmol/L   CO2 16 (L) 22 - 32 mmol/L   Glucose, Bld 142 (H) 70 - 99 mg/dL   BUN 16 8 - 23 mg/dL   Creatinine, Ser 1.86 (H) 0.44 - 1.00 mg/dL   Calcium 7.5 (L) 8.9 - 10.3 mg/dL   Total Protein 5.1 (L) 6.5 - 8.1 g/dL   Albumin 2.7 (L) 3.5 - 5.0 g/dL   AST 1,376 (H) 15 - 41 U/L   ALT 753 (H) 0 - 44 U/L   Alkaline Phosphatase 56 38 - 126 U/L   Total Bilirubin 1.2 0.3 - 1.2 mg/dL   GFR calc non Af Amer 25 (L) >60 mL/min   GFR calc Af Amer 29 (L) >60 mL/min    Comment: (NOTE) The eGFR has been calculated using the CKD EPI equation. This calculation has not been validated in all clinical situations. eGFR's persistently <60 mL/min signify possible Chronic Kidney Disease.    Anion gap 10 5 - 15    Comment: Performed at [facility], [address] [place], [place] [address]  Lactic acid, plasma     Status: Abnormal   Collection Time: 12/23/17  8:08 PM  Result Value Ref Range   Lactic Acid, Venous 4.2 (HH) 0.5 - 1.9 mmol/L    Comment: CRITICAL RESULT CALLED TO, READ BACK BY AND VERIFIED WITH: AMBURN,A @ 2113 ON 12/23/17 BY JUW Performed at [facility], [address] [place], [place] [address]   Troponin I (q 6hr x 3)     Status: Abnormal   Collection Time: 12/23/17  8:08 PM  Result Value Ref Range   Troponin I 0.03 (HH) <0.03 ng/mL    Comment: CRITICAL VALUE NOTED.  VALUE IS CONSISTENT WITH PREVIOUSLY REPORTED AND CALLED VALUE. Performed at [facility], [address], [place], [place] [address]   Blood gas, arterial     Status: Abnormal   Collection Time: 12/23/17 10:40 PM  Result Value Ref Range   FIO2 21.00    O2 Content ROOM AIR L/min   Delivery systems ROOM AIR    pH, Arterial 7.341 (L) 7.350 - 7.450   pCO2 arterial 22.6 (L) 32.0 - 48.0 mmHg   pO2, Arterial 93.6 83.0 - 108.0 mmHg    Bicarbonate 15.2 (L) 20.0 - 28.0 mmol/L   Acid-base deficit 12.8 (H) 0.0 - 2.0 mmol/L   O2 Saturation 95.5 %   Collection site RIGHT RADIAL    Drawn by 2223    Sample type ARTERIAL    Allens test (pass/fail) PASS PASS    Comment: Performed at [facility], [address], [place], [place] [address]  Lactic acid, plasma     Status: Abnormal   Collection Time: 12/23/17 10:55 PM  Result Value Ref Range  Lactic Acid, Venous 3.7 (HH) 0.5 - 1.9 mmol/L    Comment: CRITICAL RESULT CALLED TO, READ BACK BY AND VERIFIED WITH: AMBURN,A. AT 2352 ON 12/23/2017 BY EVA Performed at [facility], [address], [place], [place] [address]   Procalcitonin     Status: None   Collection Time: 12/23/17 10:55 PM  Result Value Ref Range   Procalcitonin <0.10 ng/mL    Comment:        Interpretation: PCT (Procalcitonin) <= 0.5 ng/mL: Systemic infection (sepsis) is not likely. Local bacterial infection is possible. (NOTE)       Sepsis PCT Algorithm           Lower Respiratory Tract                                      Infection PCT Algorithm    ----------------------------     ----------------------------         PCT < 0.25 ng/mL                PCT < 0.10 ng/mL         Strongly encourage             Strongly discourage   discontinuation of antibiotics    initiation of antibiotics    ----------------------------     -----------------------------       PCT 0.25 - 0.50 ng/mL            PCT 0.10 - 0.25 ng/mL               OR       >80% decrease in PCT            Discourage initiation of                                            antibiotics      Encourage discontinuation           of antibiotics    ----------------------------     -----------------------------         PCT >= 0.50 ng/mL              PCT 0.26 - 0.50 ng/mL               AND        <80% decrease in PCT             Encourage initiation of                                             antibiotics       Encourage continuation           of  antibiotics    ----------------------------     -----------------------------        PCT >= 0.50 ng/mL                  PCT > 0.50 ng/mL               AND         increase in PCT  Strongly encourage                                      initiation of antibiotics    Strongly encourage escalation           of antibiotics                                     -----------------------------                                           PCT <= 0.25 ng/mL                                                 OR                                        > 80% decrease in PCT                                     Discontinue / Do not initiate                                             antibiotics Performed at [facility], [address], [place], [place] [address]   Ammonia     Status: None   Collection Time: 12/23/17 10:55 PM  Result Value Ref Range   Ammonia 20 9 - 35 umol/L    Comment: Performed at [facility], [address] [place], [place] [address]  Culture, blood (x 2)     Status: None (Preliminary result)   Collection Time: 12/23/17 10:57 PM  Result Value Ref Range   Specimen Description BLOOD RIGHT HAND    Special Requests      BOTTLES DRAWN AEROBIC ONLY Blood Culture adequate volume   Culture      NO GROWTH < 12 HOURS Performed at [facility], [address] [place], [place] [address]    Report Status PENDING   Culture, blood (x 2)     Status: None (Preliminary result)   Collection Time: 12/23/17 10:57 PM  Result Value Ref Range   Specimen Description RIGHT ANTECUBITAL    Special Requests      BOTTLES DRAWN AEROBIC AND ANAEROBIC Blood Culture results may not be optimal due to an inadequate volume of blood received in culture bottles   Culture      NO GROWTH < 12 HOURS Performed at [facility], [address], [place], [place] [address]    Report Status PENDING   CBC with Differential/Platelet     Status: Abnormal   Collection Time: 12/24/17  1:26 AM  Result Value Ref  Range   WBC 12.4 (H) 4.0 - 10.5 K/uL   RBC 4.32 3.87 - 5.11 MIL/uL   Hemoglobin 13.3 12.0 -  15.0 g/dL   HCT 41.0 36.0 - 46.0 %   MCV 94.9 78.0 - 100.0 fL   MCH 30.8 26.0 - 34.0 pg   MCHC 32.4 30.0 - 36.0 g/dL   RDW 15.8 (H) 11.5 - 15.5 %   Platelets 188 150 - 400 K/uL   Neutrophils Relative % 79 %   Neutro Abs 9.8 (H) 1.7 - 7.7 K/uL   Lymphocytes Relative 10 %   Lymphs Abs 1.2 0.7 - 4.0 K/uL   Monocytes Relative 11 %   Monocytes Absolute 1.3 (H) 0.1 - 1.0 K/uL   Eosinophils Relative 0 %   Eosinophils Absolute 0.0 0.0 - 0.7 K/uL   Basophils Relative 0 %   Basophils Absolute 0.0 0.0 - 0.1 K/uL    Comment: Performed at [facility], [address] [place], [place] [address]  Troponin I (q 6hr x 3)     Status: Abnormal   Collection Time: 12/24/17  1:26 AM  Result Value Ref Range   Troponin I 0.03 (HH) <0.03 ng/mL    Comment: CRITICAL VALUE NOTED.  VALUE IS CONSISTENT WITH PREVIOUSLY REPORTED AND CALLED VALUE. Performed at [facility], [address] [place],  [address]   Procalcitonin     Status: None   Collection Time: 12/24/17  3:46 AM  Result Value Ref Range   Procalcitonin <0.10 ng/mL    Comment:        Interpretation: PCT (Procalcitonin) <= 0.5 ng/mL: Systemic infection (sepsis) is not likely. Local bacterial infection is possible. (NOTE)       Sepsis PCT Algorithm           Lower Respiratory Tract                                      Infection PCT Algorithm    ----------------------------     ----------------------------         PCT < 0.25 ng/mL                PCT < 0.10 ng/mL         Strongly encourage             Strongly discourage   discontinuation of antibiotics    initiation of antibiotics    ----------------------------     -----------------------------       PCT 0.25 - 0.50 ng/mL            PCT 0.10 - 0.25 ng/mL               OR       >80% decrease in PCT            Discourage initiation of                                            antibiotics       Encourage discontinuation           of antibiotics    ----------------------------     -----------------------------         PCT >= 0.50 ng/mL              PCT 0.26 - 0.50 ng/mL               AND        <80%  decrease in PCT             Encourage initiation of                                             antibiotics       Encourage continuation           of antibiotics    ----------------------------     -----------------------------        PCT >= 0.50 ng/mL                  PCT > 0.50 ng/mL               AND         increase in PCT                  Strongly encourage                                      initiation of antibiotics    Strongly encourage escalation           of antibiotics                                     -----------------------------                                           PCT <= 0.25 ng/mL                                                 OR                                        > 80% decrease in PCT                                     Discontinue / Do not initiate                                             antibiotics Performed at [facility], [address] [place], [place] [address]   Comprehensive metabolic panel     Status: Abnormal   Collection Time: 12/24/17  3:46 AM  Result Value Ref Range   Sodium 131 (L) 135 - 145 mmol/L   Potassium 5.1 3.5 - 5.1 mmol/L    Comment: DELTA CHECK NOTED   Chloride 104 98 - 111 mmol/L   CO2 15 (L) 22 - 32 mmol/L   Glucose, Bld 112 (H) 70 - 99 mg/dL   BUN 16 8 - 23 mg/dL   Creatinine, Ser 1.86 (H) 0.44 - 1.00 mg/dL   Calcium 7.5 (L) 8.9 -  10.3 mg/dL   Total Protein 5.2 (L) 6.5 - 8.1 g/dL   Albumin 2.8 (L) 3.5 - 5.0 g/dL   AST 3,441 (H) 15 - 41 U/L    Comment: RESULTS CONFIRMED BY MANUAL DILUTION   ALT 1,547 (H) 0 - 44 U/L   Alkaline Phosphatase 55 38 - 126 U/L   Total Bilirubin 2.7 (H) 0.3 - 1.2 mg/dL   GFR calc non Af Amer 25 (L) >60 mL/min   GFR calc Af Amer 29 (L) >60 mL/min    Comment: (NOTE) The eGFR has  been calculated using the CKD EPI equation. This calculation has not been validated in all clinical situations. eGFR's persistently <60 mL/min signify possible Chronic Kidney Disease.    Anion gap 12 5 - 15    Comment: Performed at [facility], [address], [place], [place] [address]  Troponin I (q 6hr x 3)     Status: Abnormal   Collection Time: 12/24/17  7:25 AM  Result Value Ref Range   Troponin I 0.03 (HH) <0.03 ng/mL    Comment: CRITICAL VALUE NOTED.  VALUE IS CONSISTENT WITH PREVIOUSLY REPORTED AND CALLED VALUE. Performed at [facility], [address] [place], [place] [address]   Hepatic function panel     Status: Abnormal   Collection Time: 12/24/17  7:25 AM  Result Value Ref Range   Total Protein 5.2 (L) 6.5 - 8.1 g/dL   Albumin 2.8 (L) 3.5 - 5.0 g/dL   AST 5,226 (H) 15 - 41 U/L    Comment: RESULTS CONFIRMED BY MANUAL DILUTION   ALT 2,200 (H) 0 - 44 U/L   Alkaline Phosphatase 54 38 - 126 U/L   Total Bilirubin 2.8 (H) 0.3 - 1.2 mg/dL   Bilirubin, Direct 1.2 (H) 0.0 - 0.2 mg/dL   Indirect Bilirubin 1.6 (H) 0.3 - 0.9 mg/dL    Comment: Performed at [facility], [address], [place], [place] [address]  Protime-INR     Status: Abnormal   Collection Time: 12/24/17  8:36 AM  Result Value Ref Range   Prothrombin Time 57.6 (H) 11.4 - 15.2 seconds    Comment: REPEATED TO VERIFY CRITICAL RESULT CALLED TO, READ BACK BY AND VERIFIED WITH: JSMITH AT 1110 BY HFLYNT 12/24/17    INR 6.65 (HH)     Comment: REPEATED TO VERIFY CRITICAL RESULT CALLED TO, READ BACK BY AND VERIFIED WITH: JSMITH AT 1110 BY HFLYNT 12/24/17 Performed at [facility], [address], [place], [place] [address]     Ct Abdomen Pelvis Wo Contrast  Result Date: 12/24/2017 CLINICAL DATA:  Cirrhosis fatty liver nausea vomiting and loose stools EXAM: CT ABDOMEN AND PELVIS WITHOUT CONTRAST TECHNIQUE: Multidetector CT imaging of the abdomen and pelvis was performed following the standard protocol without IV  contrast. COMPARISON:  Ultrasound 12/23/2017, 03/09/2015 CT FINDINGS: Lower chest: Lung bases demonstrate small bilateral pleural effusions. Bibasilar atelectasis. Septal thickening suggesting mild edema. Postsurgical changes of the left breast. Cardiomegaly. Hepatobiliary: No focal hepatic abnormality. No biliary dilatation. Slight increased intraluminal density at the gallbladder. Wall thickening and surrounding fluid. Pancreas: Unremarkable. No pancreatic ductal dilatation or surrounding inflammatory changes. Spleen: Normal in size without focal abnormality. Adrenals/Urinary Tract: Adrenal glands are normal. No hydronephrosis. Perinephric fat stranding. Bladder contains a Foley catheter Stomach/Bowel: Stomach nonenlarged. No dilated small bowel. No colon wall thickening. Appendix not clearly identified. Vascular/Lymphatic: Moderate aortic atherosclerosis without aneurysm. No significantly enlarged lymph nodes. Reproductive: Uterus and bilateral adnexa are unremarkable. Other: No free air. Small amount  of ascites within the abdomen and pelvis. Diffuse body wall edema consistent with anasarca. Musculoskeletal: Scoliosis of the spine. Treated compression deformity at T11. Partial fusion L1-L2. Degenerative changes of the lumbar spine. Left hip replacement. IMPRESSION: 1. Negative for bowel obstruction or bowel wall thickening. 2. Gallbladder wall thickening with surrounding edema and or fluid, corresponding to thickened wall noted on sonography. Findings are nonspecific and could be seen in the setting of liver disease, acute or chronic cholecystitis, and edema forming states. If acute gallbladder disease remains a concern, further evaluation with hepatobiliary nuclear medicine imaging could be obtained. 3. Anasarca with small amount of ascites in the abdomen and pelvis. 4. Small pleural effusions and bibasilar atelectasis. Septal thickening at the bases suggesting edema. Electronically Signed   By: Donavan Foil  M.D.   On: 12/24/2017 00:22   Dg Chest 2 View  Result Date: 12/22/2017 CLINICAL DATA:  77 y/o  F; chest pain with intermittent nausea. EXAM: CHEST - 2 VIEW COMPARISON:  03/28/2016 CT chest.  11/16/2014 chest radiograph. FINDINGS: Mild cardiomegaly. Calcific aortic atherosclerosis. Coarse reticular opacities greatest at the lung bases compatible pulmonary fibrosis. No consolidation, effusion, or pneumothorax. T12 chronic compression deformity. Bones are otherwise unremarkable. IMPRESSION: 1. Mild cardiomegaly. 2. Basilar predominant pulmonary fibrosis.  No focal consolidation. 3.  Aortic Atherosclerosis (ICD10-I70.0). 4. Chronic T12 compression deformity. Electronically Signed   By: Kristine Garbe M.D.   On: 12/22/2017 19:34   Ct Head Wo Contrast  Result Date: 12/23/2017 CLINICAL DATA:  Acute confusion. EXAM: CT HEAD WITHOUT CONTRAST TECHNIQUE: Contiguous axial images were obtained from the base of the skull through the vertex without intravenous contrast. COMPARISON:  None. FINDINGS: Brain: Generalized atrophy. No sign of acute infarction. 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 Chronic small-vessel changes of the deep white matter. No mass lesion, hemorrhage, hydrocephalus or extra-axial collection. Vascular: There is atherosclerotic calcification of the major vessels at the base of the brain. Skull: Negative Sinuses/Orbits: Clear/normal Other: None IMPRESSION: No acute finding by CT. Atrophy and chronic small-vessel ischemic change. 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 Electronically Signed   By: Nelson Chimes M.D.   On: 12/23/2017 19:59   US Renal  Result Date: 12/24/2017 CLINICAL DATA:  Acute renal insufficiency EXAM: RENAL ULTRASOUND COMPARISON:  CT abdomen and pelvis December 23, 2017 FINDINGS: Right Kidney: Length: 10.9 cm. Echogenicity and renal cortical thickness are within normal limits. No mass, perinephric fluid, or  hydronephrosis visualized. No sonographically demonstrable calculus or ureterectasis. Left Kidney: Length: 10.7 cm. Echogenicity and renal cortical thickness are within normal limits. No perinephric fluid or hydronephrosis visualized. There is a cyst arising from the lower pole left kidney measuring 1.7 x 1.7 x 1.0 cm. No sonographically demonstrable calculus or ureterectasis. Bladder: Decompressed with Foley catheter and cannot be assessed. There is ascites.  There is a left pleural effusion. IMPRESSION: Small left renal cyst. Kidneys otherwise appear unremarkable bilaterally. Urinary bladder is decompressed and cannot be assessed at this time. There is ascites as well as a left pleural effusion evident. Electronically Signed   By: Lowella Grip III M.D.   On: 12/24/2017 10:41   Dg Chest Port 1 View  Result Date: 12/23/2017 CLINICAL DATA:  Dyspnea and confusion EXAM: PORTABLE CHEST 1 VIEW COMPARISON:  12/22/2017, 02/04/2015, CT chest 03/28/2016 FINDINGS: Cardiomegaly with vascular congestion, small pleural effusion and interstitial edema. Confluent opacity at the left base. Enlarged central pulmonary vessels. Aortic atherosclerosis. No pneumothorax. IMPRESSION: 1. Cardiomegaly with vascular congestion, small  pleural effusion and mild increased interstitial opacity consistent with edema 2. Patchy consolidation at the left base may reflect atelectasis or superimposed pneumonia Electronically Signed   By: Donavan Foil M.D.   On: 12/23/2017 19:32   US Abdomen Limited Ruq  Result Date: 12/23/2017 CLINICAL DATA:  Nausea and vomiting for 1 day EXAM: ULTRASOUND ABDOMEN LIMITED RIGHT UPPER QUADRANT COMPARISON:  Abdominal CT 03/09/2015 FINDINGS: Gallbladder: Shadowing gallstone in the neck. There is generalized gallbladder wall thickening to 10 mm (this measurement includes pericholecystic fat) without focal tenderness or wall striation. Gallbladder is incompletely distended. Common bile duct: Diameter: 4 mm  Liver: No focal lesion identified. Within normal limits in parenchymal echogenicity. Portal vein is patent on color Doppler imaging with normal direction of blood flow towards the liver. Trace right pleural effusion IMPRESSION: Cholelithiasis. There is gallbladder wall thickening but this may be related to under distention of the gallbladder. No focal tenderness as would be expected for acute cholecystitis. Electronically Signed   By: Monte Fantasia M.D.   On: 12/23/2017 11:09    ROS:  Pertinent items are noted in HPI.  Blood pressure 131/69, pulse 79, temperature 98.2 F (36.8 C), resp. rate (!) 27, height 5' 4" (1.626 m), weight 84.9 kg, SpO2 100 %. Physical Exam: Pleasant white female no acute distress Head is normocephalic, atraumatic Eyes without scleral icterus Lungs clear to auscultation without significant wheezing Heart examination reveals a regular rate and rhythm without S3, S4, murmurs Abdomen is soft without right upper quadrant abdominal pain.  I could not palpate the liver.  No rigidity noted.  CT scan images personally reviewed  Assessment/Plan: Impression: Cholelithiasis in the face of progressive liver failure.  Doubtful that the gallbladder is related to the patient's liver condition.  No need for cholecystectomy at this time.  Did discuss this with GI and Dr. Carles Collet.  Will follow peripherally with you.  Aviva Signs 12/24/2017, 1:32 PM

## 2017-12-24 NOTE — Progress Notes (Signed)
Spoke with pharmacy. With Xarelto, would not normally see an INR this supratherapeutic. Recommending Vit k orally 5 mg with recheck tomorrow.

## 2017-12-25 DIAGNOSIS — R945 Abnormal results of liver function studies: Secondary | ICD-10-CM

## 2017-12-25 DIAGNOSIS — R7989 Other specified abnormal findings of blood chemistry: Secondary | ICD-10-CM

## 2017-12-25 DIAGNOSIS — D689 Coagulation defect, unspecified: Secondary | ICD-10-CM

## 2017-12-25 LAB — COMPREHENSIVE METABOLIC PANEL
ALT: 2826 U/L — ABNORMAL HIGH (ref 0–44)
AST: 4785 U/L — ABNORMAL HIGH (ref 15–41)
Albumin: 2.6 g/dL — ABNORMAL LOW (ref 3.5–5.0)
Alkaline Phosphatase: 53 U/L (ref 38–126)
Anion gap: 10 (ref 5–15)
BUN: 23 mg/dL (ref 8–23)
CALCIUM: 7.4 mg/dL — AB (ref 8.9–10.3)
CHLORIDE: 103 mmol/L (ref 98–111)
CO2: 22 mmol/L (ref 22–32)
Creatinine, Ser: 2.27 mg/dL — ABNORMAL HIGH (ref 0.44–1.00)
GFR calc Af Amer: 23 mL/min — ABNORMAL LOW (ref >60)
GFR, EST NON AFRICAN AMERICAN: 20 mL/min — AB (ref >60)
GLUCOSE: 117 mg/dL — AB (ref 70–99)
Potassium: 3.5 mmol/L (ref 3.5–5.1)
Sodium: 135 mmol/L (ref 135–145)
TOTAL PROTEIN: 4.9 g/dL — AB (ref 6.5–8.1)
Total Bilirubin: 1.4 mg/dL — ABNORMAL HIGH (ref 0.3–1.2)

## 2017-12-25 LAB — HEPATITIS A ANTIBODY, IGM: HEP A IGM: NEGATIVE

## 2017-12-25 LAB — CBC
HEMATOCRIT: 35.9 % — AB (ref 36.0–46.0)
Hemoglobin: 12.4 g/dL (ref 12.0–15.0)
MCH: 31.7 pg (ref 26.0–34.0)
MCHC: 34.5 g/dL (ref 30.0–36.0)
MCV: 91.8 fL (ref 78.0–100.0)
Platelets: 155 10*3/uL (ref 150–400)
RBC: 3.91 MIL/uL (ref 3.87–5.11)
RDW: 15.7 % — AB (ref 11.5–15.5)
WBC: 11.3 10*3/uL — ABNORMAL HIGH (ref 4.0–10.5)

## 2017-12-25 LAB — HEPATITIS B DNA, ULTRAQUANTITATIVE, PCR
HBV DNA SERPL PCR-ACNC: NOT DETECTED [IU]/mL
HBV DNA SERPL PCR-LOG IU: UNDETERMINED log10 IU/mL

## 2017-12-25 LAB — GLUCOSE, CAPILLARY: Glucose-Capillary: 109 mg/dL — ABNORMAL HIGH (ref 70–99)

## 2017-12-25 LAB — HCV RNA QUANT RFLX ULTRA OR GENOTYP
HCV RNA QNT(LOG COPY/ML): UNDETERMINED {Log_IU}/mL
HEPATITIS C QUANTITATION: NOT DETECTED [IU]/mL

## 2017-12-25 LAB — PROTIME-INR
INR: 3.44
PROTHROMBIN TIME: 34.4 s — AB (ref 11.4–15.2)

## 2017-12-25 LAB — HEPATITIS PANEL, ACUTE
HCV Ab: <0.1 s/co ratio (ref 0.0–0.9)
Hep A IgM: NEGATIVE
Hep B C IgM: NEGATIVE
Hepatitis B Surface Ag: NEGATIVE

## 2017-12-25 LAB — EPSTEIN BARR VRS(EBV DNA BY PCR)
EBV DNA QN by PCR: NEGATIVE copies/mL
LOG10 EBV DNA QN PCR: UNDETERMINED {Log_copies}/mL

## 2017-12-25 LAB — HEPATITIS C ANTIBODY

## 2017-12-25 LAB — HEPATITIS B SURFACE ANTIGEN: Hepatitis B Surface Ag: NEGATIVE

## 2017-12-25 MED ORDER — SODIUM CHLORIDE 0.9 % IV SOLN
INTRAVENOUS | Status: DC | PRN
  Administered 2017-12-25: 500 mL via INTRAVENOUS

## 2017-12-25 MED ORDER — PANTOPRAZOLE SODIUM 40 MG PO TBEC
40.0000 mg | DELAYED_RELEASE_TABLET | Freq: Every morning | ORAL | Status: DC
  Administered 2017-12-26 – 2018-01-01 (×7): 40 mg via ORAL
  Filled 2017-12-25 (×8): qty 1

## 2017-12-25 MED ORDER — GUAIFENESIN 100 MG/5ML PO SOLN
5.0000 mL | ORAL | Status: DC | PRN
  Administered 2017-12-25 – 2017-12-30 (×9): 100 mg via ORAL
  Filled 2017-12-25 (×9): qty 5

## 2017-12-25 MED ORDER — FUROSEMIDE 10 MG/ML IJ SOLN
20.0000 mg | Freq: Once | INTRAMUSCULAR | Status: AC
  Administered 2017-12-25: 20 mg via INTRAVENOUS
  Filled 2017-12-25: qty 2

## 2017-12-25 NOTE — Progress Notes (Signed)
PROGRESS NOTE  Karina Tran NOT:[id number] DOB: [birth year] DOA: 12/22/2017 PCP: Curly Rim, MD  Brief History: 77 year old female with a history of persistent atrial fibrillation, hypertension, interstitial lung disease presenting with acute onset of nausea, vomiting, generalized weakness, diaphoresis that began around 2 PM on 12/22/2017. The patient had been in her usual state of health up until 2 PM on 12/22/2017. The patient decided to go take a nap after brunch on 12/22/2017. She woke up to go to the bathroom. At that time, the patient felt lightheaded and dizzy. She subsequently had a loose stool without any hematochezia or melena. When she went back to her bedroom, the patient had generalized weakness with diaphoresis, but she denied any chest discomfort, shortness of breath, visual disturbance, focal extremity weakness. As result, EMS was activated. The patient denied any recent travels, exotic or undercooked foods, or sick contacts. She lives with her daughter and son-in-law and her grandchildren all of whom are in good health. She denies any fevers, chills, abdominal pain, dysuria, hematuria, hematochezia, melena, rashes, synovitis. She has not had any recent travels. She has not had any recent changes in any of her medications. The patient had numerous episodes of dry heaves on 12/22/2017 which continued in the emergency department. In the emergency department, the patient was noted to be afebrile but hypotensive with blood pressure of 77/58. She was saturating 100% on room air. She was noted to have elevated LFTs with AST 97, ALT 51, alk phosphatase 85, total bilirubin 1.1 at the time of admission. WBC was 10.2. Lactic acid peaked at 3.9. She was started on vancomycin and Zosyn after blood cultures and urine cultures were obtained. Chest x-ray showed pulmonary fibrosis without any consolidations. Notably, the patient developed vague right shoulder pain that  she described as moderate, intermittent, with a dull ache sensation.  Assessment/Plan: Sepsis -concerned about cholecystitis -general surgery consulted--discussed with Dr. Aviva Signs -continue zosyn -blood cultures remain neg -d/c vanco -PCT <0.10 -lactic acid peaked 4.2 -continue IVF -check Coags although this will be elevated partly due to pt being on Xarelto -hold xarelto for now as pt may need invasive procedures and due to coagulopathy -check cortisol--28.8  Transaminasemia -multifactorial including ischemic hepatitis, cholecystitis, possible viral hepatitis -12/23/17 RUQ US--GB wall thickening with cholelithiasis, possible GB neck stone -12/24/17 CT abd--GB wall thickening with surrounding fluid--?cholecystitis vs ascites/anasarca -suspect main driver is ischemic hepatitis; pt had no prodrome for viral hepatitis -GI consult appreciated -viral hepatitis serologies ordered, but may be neg in acute setting -check hep C RNA, hep B DNA, CMV DNA, EBV DNA -lipase 26  AKI -due to sepsis and hemodynamic changes -renal US--no hydronephrosis -Baseline creatinine 0.6-0.8 -d/c vanco -expecting serum creatinine to reach plateau 12/26/17 -stop bicarbonate drip 12/25/17 and recheck BMP in am  Coagulopathy -due to rivaroxaban and liver injury -vitamin K x 1 on9/16/19 -daily INR  Elevated troponin -no chest pain -personally reviewed EKG--sinus, nonspecific T wave changes -due to demand ischemia -12/24/17 Echo--EF 45%, diffuse HK, G2DD, PASP 33  Right Shoulder pain/Transaminasemia -concerned about cholecystitis -work up as above  Fluid overload -lasix IV 40 mg on 9/16 -lasix IV 20 mg on 12/25/17  Cardiomyopathy -suspect related to Etoh, stress -12/24/17 Echo--EF 45%, diffuse HK, G2DD, PASP 33 -will ultimately need cardiology follow up  Pyuria -urine culture neg  Persistent atrial fibrillation -Holding atenolol secondary to hypotension -Continue flecainide -Hold  rivaroxaban  Hyperglycemia -Hemoglobin A1c--5.4  Interstitial Lung Disease -  stable on RA  Etoh Dependence -12/24/17 pt revealed she drinks 6 pack of beer on sat and sun -CIWA  Hyponatremia -due to AKI and defect in Na reabsorption    Disposition Plan: remain in ICU  Family Communication:NoFamily at bedside--left VM for daughter x 3 days  Consultants:none  Code Status: FULL  DVT Prophylaxis:rivaroxaban   Procedures: As Listed in Progress Note Above  Antibiotics: vanco 9/14>>> Zosyn 9/14>>>9/16  The patient is critically ill with multiple organ systems failure and requires high complexity decision making for assessment and support, frequent evaluation and titration of therapies, application of advanced monitoring technologies and extensive interpretation of multiple databases.  Critical care time -66mns.     Subjective: Pt still c/o sob but improving.  Denies cp, n/v/d.  C/o epigastric pain.  Tolerated soft diet today.  No dysuria, headache.  No rash, no cp. No f/c  Objective: Vitals:   12/25/17 0800 12/25/17 0900 12/25/17 1000 12/25/17 1100  BP: 102/67 138/79 (!) 95/44 121/75  Pulse: 71 74 75 74  Resp: 13 (!) 21 (!) 21 18  Temp: 98.8 F (37.1 C) 98.6 F (37 C) 98.5 F (36.9 C) 98.7 F (37.1 C)  TempSrc:      SpO2:  97% 99% 98%  Weight:      Height:        Intake/Output Summary (Last 24 hours) at 12/25/2017 1326 Last data filed at 12/25/2017 0700 Gross per 24 hour  Intake 2033.38 ml  Output 950 ml  Net 1083.38 ml   Weight change: 0 kg Exam:   General:  Pt is alert, follows commands appropriately, not in acute distress  HEENT: No icterus, No thrush, No neck mass, [place]/AT  Cardiovascular: RRR, S1/S2, no rubs, no gallops  Respiratory: bibasilar crackles, upper airway wheeze  Abdomen: Soft/+BS, non tender, non distended, no guarding  Extremities: trace LE edema, No lymphangitis, No petechiae, No rashes, no  synovitis   Data Reviewed: I have personally reviewed following labs and imaging studies Basic Metabolic Panel: Recent Labs  Lab 12/23/17 0405 12/23/17 2008 12/24/17 0346 12/24/17 1349 12/25/17 0410  NA 136 134* 131* 132* 135  K 4.5 3.7 5.1 4.2 3.5  CL 108 108 104 102 103  CO2 20* 16* 15* 16* 22  GLUCOSE 123* 142* 112* 103* 117*  BUN _0 CREATININE 1.19* 1.86* 1.86* 2.03* 2.27*  CALCIUM 7.8* 7.5* 7.5* 7.5* 7.4*   Liver Function Tests: Recent Labs  Lab 12/23/17 0405 12/23/17 2008 12/24/17 0346 12/24/17 0725 12/25/17 0410  AST 270* 1,376* 3,441* 5,226* 4,785*  ALT 187* 753* 1,547* 2,200* 2,826*  ALKPHOS 63 56 55 54 53  BILITOT 1.3* 1.2 2.7* 2.8* 1.4*  PROT 5.0* 5.1* 5.2* 5.2* 4.9*  ALBUMIN 2.7* 2.7* 2.8* 2.8* 2.6*   Recent Labs  Lab 12/23/17 0901  LIPASE 26   Recent Labs  Lab 12/23/17 2255  AMMONIA 20   Coagulation Profile: Recent Labs  Lab 12/24/17 0836 12/25/17 0846  INR 6.65* 3.44   CBC: Recent Labs  Lab 12/22/17 1745 12/23/17 0405 12/23/17 2008 12/24/17 0126 12/25/17 0410  WBC 10.2 8.2 10.7* 12.4* 11.3*  NEUTROABS 7.6  --  7.6 9.8*  --   HGB 14.5 12.9 12.9 13.3 12.4  HCT 43.9 40.6 40.6 41.0 35.9*  MCV 94.2 93.8 95.8 94.9 91.8  PLT 223 205 191 188 155   Cardiac Enzymes: Recent Labs  Lab 12/23/17 0405 12/23/17 0901 12/23/17 2008 12/24/17 0126 12/24/17 0725  CKTOTAL  --  22*  --   --   --   TROPONINI <0.03 0.03* 0.03* 0.03* 0.03*   BNP: Invalid input(s): POCBNP CBG: Recent Labs  Lab 12/23/17 1901 12/25/17 0409  GLUCAP 126* 109*   HbA1C: Recent Labs    12/23/17 0901  HGBA1C 5.4   Urine analysis:    Component Value Date/Time   COLORURINE AMBER (A) 12/22/2017 1838   APPEARANCEUR CLOUDY (A) 12/22/2017 1838   LABSPEC 1.017 12/22/2017 1838   PHURINE 5.0 12/22/2017 1838   GLUCOSEU 50 (A) 12/22/2017 1838   HGBUR NEGATIVE 12/22/2017 1838   BILIRUBINUR NEGATIVE 12/22/2017 1838   KETONESUR NEGATIVE 12/22/2017  1838   PROTEINUR >=300 (A) 12/22/2017 1838   UROBILINOGEN 1.0 02/04/2015 1533   NITRITE NEGATIVE 12/22/2017 1838   LEUKOCYTESUR NEGATIVE 12/22/2017 1838   Sepsis Labs: _0 (procalcitonin:4,lacticidven:4) ) Recent Results (from the past 240 hour(s))  Blood culture (routine x 2)     Status: None (Preliminary result)   Collection Time: 12/22/17  6:45 PM  Result Value Ref Range Status   Specimen Description RIGHT ANTECUBITAL  Final   Special Requests   Final    BOTTLES DRAWN AEROBIC AND ANAEROBIC Blood Culture adequate volume   Culture   Final    NO GROWTH 3 DAYS Performed at [facility], [address] [place], [place] [address]    Report Status PENDING  Incomplete  Blood culture (routine x 2)     Status: None (Preliminary result)   Collection Time: 12/22/17  7:30 PM  Result Value Ref Range Status   Specimen Description RIGHT ANTECUBITAL  Final   Special Requests   Final    BOTTLES DRAWN AEROBIC ONLY Blood Culture adequate volume   Culture   Final    NO GROWTH 3 DAYS Performed at [facility], [address], [place], [place] [address]    Report Status PENDING  Incomplete  Urine culture     Status: None   Collection Time: 12/22/17  9:31 PM  Result Value Ref Range Status   Specimen Description   Final    URINE, RANDOM Performed at [facility], [address] [place], [place] [address]    Special Requests   Final    NONE Performed at [facility], [address], [place], [place] [address]    Culture   Final    NO GROWTH Performed at [facility], [place] [address], [place], [place] [address]    Report Status 12/24/2017 FINAL  Final  MRSA PCR Screening     Status: None   Collection Time: 12/22/17 11:18 PM  Result Value Ref Range Status   MRSA by PCR NEGATIVE NEGATIVE Final    Comment:        The GeneXpert MRSA Assay (FDA approved for NASAL specimens only), is one component of a comprehensive MRSA colonization surveillance program. It is not intended to  diagnose MRSA infection nor to guide or monitor treatment for MRSA infections. Performed at [facility], [address] [place], [place] [address]   Culture, blood (x 2)     Status: None (Preliminary result)   Collection Time: 12/23/17 10:57 PM  Result Value Ref Range Status   Specimen Description BLOOD RIGHT HAND  Final   Special Requests   Final    BOTTLES DRAWN AEROBIC ONLY Blood Culture adequate volume   Culture   Final    NO GROWTH 2 DAYS Performed at [facility], [address], [place],  [address]    Report Status PENDING  Incomplete  Culture, blood (x 2)     Status: None (Preliminary result)   Collection Time: 12/23/17 10:57 PM  Result Value Ref Range Status   Specimen Description RIGHT ANTECUBITAL  Final   Special Requests   Final    BOTTLES DRAWN AEROBIC AND ANAEROBIC Blood Culture results may not be optimal due to an inadequate volume of blood received in culture bottles   Culture   Final    NO GROWTH 2 DAYS Performed at [facility], [address], [place], [place] [address]    Report Status PENDING  Incomplete     Scheduled Meds: . chlorhexidine  15 mL Mouth Rinse BID  . flecainide  50 mg Oral Q12H  . ipratropium-albuterol  3 mL Nebulization TID  . mouth rinse  15 mL Mouth Rinse q12n4p  . metoCLOPramide (REGLAN) injection  5 mg Intravenous Q8H  . pantoprazole  40 mg Oral q morning - 10a   Continuous Infusions: . piperacillin-tazobactam (ZOSYN)  IV 12.5 mL/hr at 12/25/17 8099  .  sodium bicarbonate  infusion 1000 mL 75 mL/hr at 12/25/17 8338    Procedures/Studies: Ct Abdomen Pelvis Wo Contrast  Result Date: 12/24/2017 CLINICAL DATA:  Cirrhosis fatty liver nausea vomiting and loose stools EXAM: CT ABDOMEN AND PELVIS WITHOUT CONTRAST TECHNIQUE: Multidetector CT imaging of the abdomen and pelvis was performed following the standard protocol without IV contrast. COMPARISON:  Ultrasound 12/23/2017, 03/09/2015 CT FINDINGS: Lower chest: Lung bases  demonstrate small bilateral pleural effusions. Bibasilar atelectasis. Septal thickening suggesting mild edema. Postsurgical changes of the left breast. Cardiomegaly. Hepatobiliary: No focal hepatic abnormality. No biliary dilatation. Slight increased intraluminal density at the gallbladder. Wall thickening and surrounding fluid. Pancreas: Unremarkable. No pancreatic ductal dilatation or surrounding inflammatory changes. Spleen: Normal in size without focal abnormality. Adrenals/Urinary Tract: Adrenal glands are normal. No hydronephrosis. Perinephric fat stranding. Bladder contains a Foley catheter Stomach/Bowel: Stomach nonenlarged. No dilated small bowel. No colon wall thickening. Appendix not clearly identified. Vascular/Lymphatic: Moderate aortic atherosclerosis without aneurysm. No significantly enlarged lymph nodes. Reproductive: Uterus and bilateral adnexa are unremarkable. Other: No free air. Small amount of ascites within the abdomen and pelvis. Diffuse body wall edema consistent with anasarca. Musculoskeletal: Scoliosis of the spine. Treated compression deformity at T11. Partial fusion L1-L2. Degenerative changes of the lumbar spine. Left hip replacement. IMPRESSION: 1. Negative for bowel obstruction or bowel wall thickening. 2. Gallbladder wall thickening with surrounding edema and or fluid, corresponding to thickened wall noted on sonography. Findings are nonspecific and could be seen in the setting of liver disease, acute or chronic cholecystitis, and edema forming states. If acute gallbladder disease remains a concern, further evaluation with hepatobiliary nuclear medicine imaging could be obtained. 3. Anasarca with small amount of ascites in the abdomen and pelvis. 4. Small pleural effusions and bibasilar atelectasis. Septal thickening at the bases suggesting edema. Electronically Signed   By: Donavan Foil M.D.   On: 12/24/2017 00:22   Dg Chest 2 View  Result Date: 12/22/2017 CLINICAL DATA:  77  y/o  F; chest pain with intermittent nausea. EXAM: CHEST - 2 VIEW COMPARISON:  03/28/2016 CT chest.  11/16/2014 chest radiograph. FINDINGS: Mild cardiomegaly. Calcific aortic atherosclerosis. Coarse reticular opacities greatest at the lung bases compatible pulmonary fibrosis. No consolidation, effusion, or pneumothorax. T12 chronic compression deformity. Bones are otherwise unremarkable. IMPRESSION: 1. Mild cardiomegaly. 2. Basilar predominant pulmonary fibrosis.  No focal consolidation. 3.  Aortic Atherosclerosis (ICD10-I70.0). 4. Chronic T12 compression deformity. Electronically Signed   By: Kristine Garbe  M.D.   On: 12/22/2017 19:34   Ct Head Wo Contrast  Result Date: 12/23/2017 CLINICAL DATA:  Acute confusion. EXAM: CT HEAD WITHOUT CONTRAST TECHNIQUE: Contiguous axial images were obtained from the base of the skull through the vertex without intravenous contrast. COMPARISON:  None. FINDINGS: Brain: Generalized atrophy. No sign of acute infarction. 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 Chronic small-vessel changes of the deep white matter. No mass lesion, hemorrhage, hydrocephalus or extra-axial collection. Vascular: There is atherosclerotic calcification of the major vessels at the base of the brain. Skull: Negative Sinuses/Orbits: Clear/normal Other: None IMPRESSION: No acute finding by CT. Atrophy and chronic small-vessel ischemic change. 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 Electronically Signed   By: Nelson Chimes M.D.   On: 12/23/2017 19:59   US Renal  Result Date: 12/24/2017 CLINICAL DATA:  Acute renal insufficiency EXAM: RENAL ULTRASOUND COMPARISON:  CT abdomen and pelvis December 23, 2017 FINDINGS: Right Kidney: Length: 10.9 cm. Echogenicity and renal cortical thickness are within normal limits. No mass, perinephric fluid, or hydronephrosis visualized. No sonographically demonstrable calculus or ureterectasis. Left Kidney: Length:  10.7 cm. Echogenicity and renal cortical thickness are within normal limits. No perinephric fluid or hydronephrosis visualized. There is a cyst arising from the lower pole left kidney measuring 1.7 x 1.7 x 1.0 cm. No sonographically demonstrable calculus or ureterectasis. Bladder: Decompressed with Foley catheter and cannot be assessed. There is ascites.  There is a left pleural effusion. IMPRESSION: Small left renal cyst. Kidneys otherwise appear unremarkable bilaterally. Urinary bladder is decompressed and cannot be assessed at this time. There is ascites as well as a left pleural effusion evident. Electronically Signed   By: Lowella Grip III M.D.   On: 12/24/2017 10:41   Dg Chest Port 1 View  Result Date: 12/23/2017 CLINICAL DATA:  Dyspnea and confusion EXAM: PORTABLE CHEST 1 VIEW COMPARISON:  12/22/2017, 02/04/2015, CT chest 03/28/2016 FINDINGS: Cardiomegaly with vascular congestion, small pleural effusion and interstitial edema. Confluent opacity at the left base. Enlarged central pulmonary vessels. Aortic atherosclerosis. No pneumothorax. IMPRESSION: 1. Cardiomegaly with vascular congestion, small pleural effusion and mild increased interstitial opacity consistent with edema 2. Patchy consolidation at the left base may reflect atelectasis or superimposed pneumonia Electronically Signed   By: Donavan Foil M.D.   On: 12/23/2017 19:32   US Abdomen Limited Ruq  Result Date: 12/23/2017 CLINICAL DATA:  Nausea and vomiting for 1 day EXAM: ULTRASOUND ABDOMEN LIMITED RIGHT UPPER QUADRANT COMPARISON:  Abdominal CT 03/09/2015 FINDINGS: Gallbladder: Shadowing gallstone in the neck. There is generalized gallbladder wall thickening to 10 mm (this measurement includes pericholecystic fat) without focal tenderness or wall striation. Gallbladder is incompletely distended. Common bile duct: Diameter: 4 mm Liver: No focal lesion identified. Within normal limits in parenchymal echogenicity. Portal vein is patent on  color Doppler imaging with normal direction of blood flow towards the liver. Trace right pleural effusion IMPRESSION: Cholelithiasis. There is gallbladder wall thickening but this may be related to under distention of the gallbladder. No focal tenderness as would be expected for acute cholecystitis. Electronically Signed   By: Monte Fantasia M.D.   On: 12/23/2017 11:09    Orson Eva, DO  [facility] Pager [phone]  If 7PM-7AM, please contact night-coverage [email or web address] Password [email or web address] 12/25/2017, 1:26 PM   LOS: 3 days

## 2017-12-25 NOTE — Progress Notes (Signed)
Subjective: Less shortness of breath. No abdominal pain unless coughing a lot. Notes "coughing and hacking". No other complaints.   Objective: Vital signs in last 24 hours: Temp:  [98.3 F (36.8 C)-99.5 F (37.5 C)] 98.7 F (37.1 C) (09/17 0600) Pulse Rate:  [63-79] 73 (09/17 0700) Resp:  [11-25] 19 (09/17 0700) BP: (103-136)/(54-83) 103/66 (09/17 0700) SpO2:  [93 %-100 %] 97 % (09/17 0600) Weight:  [84.9 kg] 84.9 kg (09/17 0700) Last BM Date: 12/23/17 General:   Alert and oriented, pleasant Head:  Normocephalic and atraumatic. Abdomen:  Bowel sounds present, soft, non-tender, non-distended. No HSM or hernias noted. No rebound or guarding. No masses appreciated  Neurologic:  Alert and  oriented x4 Psych:  Alert and cooperative. Normal mood and affect.  Intake/Output from previous day: 09/16 0701 - 09/17 0700 In: 3220.4 [P.O.:50; I.V.:2903.3; IV Piggyback:267.1] Out: 950 [Urine:950] Intake/Output this shift: No intake/output data recorded.  Lab Results: Recent Labs    12/23/17 2008 12/24/17 0126 12/25/17 0410  WBC 10.7* 12.4* 11.3*  HGB 12.9 13.3 12.4  HCT 40.6 41.0 35.9*  PLT 191 188 155   BMET Recent Labs    12/24/17 0346 12/24/17 1349 12/25/17 0410  NA 131* 132* 135  K 5.1 4.2 3.5  CL 104 102 103  CO2 15* 16* 22  GLUCOSE 112* 103* 117*  BUN 16 19 23   CREATININE 1.86* 2.03* 2.27*  CALCIUM 7.5* 7.5* 7.4*   LFT Recent Labs    12/24/17 0346 12/24/17 0725 12/25/17 0410  PROT 5.2* 5.2* 4.9*  ALBUMIN 2.8* 2.8* 2.6*  AST 3,441* 5,226* 4,785*  ALT 1,547* 2,200* 2,826*  ALKPHOS 55 54 86  BILITOT 2.7* 2.8* 1.4*  BILIDIR  --  1.2*  --   IBILI  --  1.6*  --    PT/INR Recent Labs    12/24/17 0836  LABPROT 57.6*  INR 6.65*   Hepatitis Panel Recent Labs    12/23/17 2255  HEPBSAG Negative  HCVAB <0.1  HEPAIGM Negative  HEPBIGM Negative    Studies/Results: Ct Abdomen Pelvis Wo Contrast  Result Date: 12/24/2017 CLINICAL DATA:  Cirrhosis  fatty liver nausea vomiting and loose stools EXAM: CT ABDOMEN AND PELVIS WITHOUT CONTRAST TECHNIQUE: Multidetector CT imaging of the abdomen and pelvis was performed following the standard protocol without IV contrast. COMPARISON:  Ultrasound 12/23/2017, 03/09/2015 CT FINDINGS: Lower chest: Lung bases demonstrate small bilateral pleural effusions. Bibasilar atelectasis. Septal thickening suggesting mild edema. Postsurgical changes of the left breast. Cardiomegaly. Hepatobiliary: No focal hepatic abnormality. No biliary dilatation. Slight increased intraluminal density at the gallbladder. Wall thickening and surrounding fluid. Pancreas: Unremarkable. No pancreatic ductal dilatation or surrounding inflammatory changes. Spleen: Normal in size without focal abnormality. Adrenals/Urinary Tract: Adrenal glands are normal. No hydronephrosis. Perinephric fat stranding. Bladder contains a Foley catheter Stomach/Bowel: Stomach nonenlarged. No dilated small bowel. No colon wall thickening. Appendix not clearly identified. Vascular/Lymphatic: Moderate aortic atherosclerosis without aneurysm. No significantly enlarged lymph nodes. Reproductive: Uterus and bilateral adnexa are unremarkable. Other: No free air. Small amount of ascites within the abdomen and pelvis. Diffuse body wall edema consistent with anasarca. Musculoskeletal: Scoliosis of the spine. Treated compression deformity at T11. Partial fusion L1-L2. Degenerative changes of the lumbar spine. Left hip replacement. IMPRESSION: 1. Negative for bowel obstruction or bowel wall thickening. 2. Gallbladder wall thickening with surrounding edema and or fluid, corresponding to thickened wall noted on sonography. Findings are nonspecific and could be seen in the setting of liver disease, acute or chronic cholecystitis,  and edema forming states. If acute gallbladder disease remains a concern, further evaluation with hepatobiliary nuclear medicine imaging could be obtained. 3.  Anasarca with small amount of ascites in the abdomen and pelvis. 4. Small pleural effusions and bibasilar atelectasis. Septal thickening at the bases suggesting edema. Electronically Signed   By: Donavan Foil M.D.   On: 12/24/2017 00:22   Ct Head Wo Contrast  Result Date: 12/23/2017 CLINICAL DATA:  Acute confusion. EXAM: CT HEAD WITHOUT CONTRAST TECHNIQUE: Contiguous axial images were obtained from the base of the skull through the vertex without intravenous contrast. COMPARISON:  None. FINDINGS: Brain: Generalized atrophy. No sign of acute infarction. 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 Chronic small-vessel changes of the deep white matter. No mass lesion, hemorrhage, hydrocephalus or extra-axial collection. Vascular: There is atherosclerotic calcification of the major vessels at the base of the brain. Skull: Negative Sinuses/Orbits: Clear/normal Other: None IMPRESSION: No acute finding by CT. Atrophy and chronic small-vessel ischemic change. 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 Electronically Signed   By: Nelson Chimes M.D.   On: 12/23/2017 19:59   US Renal  Result Date: 12/24/2017 CLINICAL DATA:  Acute renal insufficiency EXAM: RENAL ULTRASOUND COMPARISON:  CT abdomen and pelvis December 23, 2017 FINDINGS: Right Kidney: Length: 10.9 cm. Echogenicity and renal cortical thickness are within normal limits. No mass, perinephric fluid, or hydronephrosis visualized. No sonographically demonstrable calculus or ureterectasis. Left Kidney: Length: 10.7 cm. Echogenicity and renal cortical thickness are within normal limits. No perinephric fluid or hydronephrosis visualized. There is a cyst arising from the lower pole left kidney measuring 1.7 x 1.7 x 1.0 cm. No sonographically demonstrable calculus or ureterectasis. Bladder: Decompressed with Foley catheter and cannot be assessed. There is ascites.  There is a left pleural effusion. IMPRESSION: Small left  renal cyst. Kidneys otherwise appear unremarkable bilaterally. Urinary bladder is decompressed and cannot be assessed at this time. There is ascites as well as a left pleural effusion evident. Electronically Signed   By: Lowella Grip III M.D.   On: 12/24/2017 10:41   Dg Chest Port 1 View  Result Date: 12/23/2017 CLINICAL DATA:  Dyspnea and confusion EXAM: PORTABLE CHEST 1 VIEW COMPARISON:  12/22/2017, 02/04/2015, CT chest 03/28/2016 FINDINGS: Cardiomegaly with vascular congestion, small pleural effusion and interstitial edema. Confluent opacity at the left base. Enlarged central pulmonary vessels. Aortic atherosclerosis. No pneumothorax. IMPRESSION: 1. Cardiomegaly with vascular congestion, small pleural effusion and mild increased interstitial opacity consistent with edema 2. Patchy consolidation at the left base may reflect atelectasis or superimposed pneumonia Electronically Signed   By: Donavan Foil M.D.   On: 12/23/2017 19:32   US Abdomen Limited Ruq  Result Date: 12/23/2017 CLINICAL DATA:  Nausea and vomiting for 1 day EXAM: ULTRASOUND ABDOMEN LIMITED RIGHT UPPER QUADRANT COMPARISON:  Abdominal CT 03/09/2015 FINDINGS: Gallbladder: Shadowing gallstone in the neck. There is generalized gallbladder wall thickening to 10 mm (this measurement includes pericholecystic fat) without focal tenderness or wall striation. Gallbladder is incompletely distended. Common bile duct: Diameter: 4 mm Liver: No focal lesion identified. Within normal limits in parenchymal echogenicity. Portal vein is patent on color Doppler imaging with normal direction of blood flow towards the liver. Trace right pleural effusion IMPRESSION: Cholelithiasis. There is gallbladder wall thickening but this may be related to under distention of the gallbladder. No focal tenderness as would be expected for acute cholecystitis. Electronically Signed   By: Monte Fantasia M.D.   On: 12/23/2017 11:09    Assessment:  77 year old admitted  with sepsis, found to have increasing transaminases markedly elevated and clinically most consistent with likely related to ischemic hepatitis. Viral hepatitis serologies negative as expected. She was on Xarelto prior to admission for history of afib, but coagulopathy noted with INR yesterday morning 6.65, receiving 5 mg oral Vit K yesterday. Repeat INR pending. Transaminases reviewed today, with AST improved and ALT slightly increased. Bilirubin overall decreased. Follow LFTs, INR, rechecking tomorrow. If no significant improvement in next 24-48 hours, further serologies will be drawn. Anticipate these will start falling soon. Clinically, she feels better overall.     Plan: INR pending this morning HFP, INR tomorrow morning Further serologies if no improvement in LFTs  Start soft diet  Annitta Needs, PhD, ANP-BC [facility]    LOS: 3 days    12/25/2017, 8:49 AM

## 2017-12-26 ENCOUNTER — Inpatient Hospital Stay (HOSPITAL_COMMUNITY): Payer: Medicare Other

## 2017-12-26 DIAGNOSIS — I10 Essential (primary) hypertension: Secondary | ICD-10-CM

## 2017-12-26 DIAGNOSIS — A419 Sepsis, unspecified organism: Secondary | ICD-10-CM

## 2017-12-26 DIAGNOSIS — I9589 Other hypotension: Secondary | ICD-10-CM

## 2017-12-26 DIAGNOSIS — D689 Coagulation defect, unspecified: Secondary | ICD-10-CM

## 2017-12-26 DIAGNOSIS — J849 Interstitial pulmonary disease, unspecified: Secondary | ICD-10-CM

## 2017-12-26 DIAGNOSIS — N179 Acute kidney failure, unspecified: Secondary | ICD-10-CM

## 2017-12-26 DIAGNOSIS — K8020 Calculus of gallbladder without cholecystitis without obstruction: Secondary | ICD-10-CM

## 2017-12-26 LAB — CMV DNA, QUANTITATIVE, PCR
CMV DNA QUANT: NEGATIVE [IU]/mL
Log10 CMV Qn DNA Pl: UNDETERMINED log10 IU/mL

## 2017-12-26 LAB — COMPREHENSIVE METABOLIC PANEL
ALT: 2039 U/L — ABNORMAL HIGH (ref 0–44)
AST: 1724 U/L — ABNORMAL HIGH (ref 15–41)
Albumin: 2.7 g/dL — ABNORMAL LOW (ref 3.5–5.0)
Alkaline Phosphatase: 60 U/L (ref 38–126)
Anion gap: 14 (ref 5–15)
BUN: 22 mg/dL (ref 8–23)
CO2: 25 mmol/L (ref 22–32)
Calcium: 7.8 mg/dL — ABNORMAL LOW (ref 8.9–10.3)
Chloride: 98 mmol/L (ref 98–111)
Creatinine, Ser: 2.07 mg/dL — ABNORMAL HIGH (ref 0.44–1.00)
GFR calc Af Amer: 25 mL/min — ABNORMAL LOW (ref >60)
GFR, EST NON AFRICAN AMERICAN: 22 mL/min — AB (ref >60)
Glucose, Bld: 100 mg/dL — ABNORMAL HIGH (ref 70–99)
POTASSIUM: 2.4 mmol/L — AB (ref 3.5–5.1)
Sodium: 137 mmol/L (ref 135–145)
TOTAL PROTEIN: 5.2 g/dL — AB (ref 6.5–8.1)
Total Bilirubin: 1.3 mg/dL — ABNORMAL HIGH (ref 0.3–1.2)

## 2017-12-26 LAB — CBC
HEMATOCRIT: 36.8 % (ref 36.0–46.0)
HEMOGLOBIN: 12.3 g/dL (ref 12.0–15.0)
MCH: 30.8 pg (ref 26.0–34.0)
MCHC: 33.4 g/dL (ref 30.0–36.0)
MCV: 92.2 fL (ref 78.0–100.0)
Platelets: 162 10*3/uL (ref 150–400)
RBC: 3.99 MIL/uL (ref 3.87–5.11)
RDW: 15.8 % — ABNORMAL HIGH (ref 11.5–15.5)
WBC: 9.6 10*3/uL (ref 4.0–10.5)

## 2017-12-26 LAB — MAGNESIUM: Magnesium: 1.4 mg/dL — ABNORMAL LOW (ref 1.7–2.4)

## 2017-12-26 LAB — PROTIME-INR
INR: 1.87
PROTHROMBIN TIME: 21.4 s — AB (ref 11.4–15.2)

## 2017-12-26 MED ORDER — FUROSEMIDE 10 MG/ML IJ SOLN
20.0000 mg | Freq: Two times a day (BID) | INTRAMUSCULAR | Status: DC
  Administered 2017-12-26 – 2017-12-28 (×4): 20 mg via INTRAVENOUS
  Filled 2017-12-26 (×5): qty 2

## 2017-12-26 MED ORDER — MAGNESIUM SULFATE 4 GM/100ML IV SOLN
4.0000 g | Freq: Once | INTRAVENOUS | Status: AC
  Administered 2017-12-26: 4 g via INTRAVENOUS
  Filled 2017-12-26: qty 100

## 2017-12-26 MED ORDER — ALBUTEROL SULFATE (2.5 MG/3ML) 0.083% IN NEBU: 2.5000 mg | INHALATION_SOLUTION | RESPIRATORY_TRACT | Status: DC | PRN

## 2017-12-26 MED ORDER — FUROSEMIDE 10 MG/ML IJ SOLN
20.0000 mg | Freq: Once | INTRAMUSCULAR | Status: AC
  Administered 2017-12-26: 20 mg via INTRAVENOUS
  Filled 2017-12-26: qty 2

## 2017-12-26 MED ORDER — POTASSIUM CHLORIDE CRYS ER 20 MEQ PO TBCR
40.0000 meq | EXTENDED_RELEASE_TABLET | Freq: Three times a day (TID) | ORAL | Status: AC
  Administered 2017-12-26 (×3): 40 meq via ORAL
  Filled 2017-12-26 (×4): qty 2

## 2017-12-26 MED ORDER — METHYLPREDNISOLONE SODIUM SUCC 125 MG IJ SOLR
60.0000 mg | Freq: Two times a day (BID) | INTRAMUSCULAR | Status: DC
  Administered 2017-12-26 – 2017-12-29 (×7): 60 mg via INTRAVENOUS
  Filled 2017-12-26 (×7): qty 2

## 2017-12-26 NOTE — Consult Note (Addendum)
Subjective:  Wheezing this morning. When coughs, has diffuse abd pain. Some loose stool with coughing. No melena, brbpr. No n/v. Eating but only "a little".   Objective: Vital signs in last 24 hours: Temp:  [98.5 F (36.9 C)-99.2 F (37.3 C)] 98.9 F (37.2 C) (09/18 0736) Pulse Rate:  [69-81] 74 (09/18 0736) Resp:  [15-26] 22 (09/18 0736) BP: (87-151)/(44-81) 135/79 (09/18 0000) SpO2:  [93 %-100 %] 100 % (09/18 0736) Weight:  [84 kg] 84 kg (09/18 0500) Last BM Date: 12/24/17 General:   Alert,  Well-developed, well-nourished, pleasant and cooperative in NAD Head:  Normocephalic and atraumatic. Eyes:  Sclera clear, no icterus.  Chest: bilaterally wheezing.     Heart:  Regular rate and rhythm; no murmurs, clicks, rubs,  or gallops. Abdomen:  Soft, lower abd nontender and nondistended. Normal bowel sounds, without guarding, and without rebound.   Extremities:  Without clubbing, deformity or edema. Neurologic:  Alert and  oriented x4;  grossly normal neurologically. Skin:  Intact without significant lesions or rashes. Psych:  Alert and cooperative. Normal mood and affect.  Intake/Output from previous day: 09/17 0701 - 09/18 0700 In: 1102.3 [P.O.:480; I.V.:583; IV Piggyback:39.3] Out: 2950 [Urine:2950] Intake/Output this shift: No intake/output data recorded.  Lab Results: CBC Recent Labs    12/24/17 0126 12/25/17 0410 12/26/17 0412  WBC 12.4* 11.3* 9.6  HGB 13.3 12.4 12.3  HCT 41.0 35.9* 36.8  MCV 94.9 91.8 92.2  PLT 188 155 162   BMET Recent Labs    12/24/17 1349 12/25/17 0410 12/26/17 0412  NA 132* 135 137  K 4.2 3.5 2.4*  CL 102 103 98  CO2 16* 22 25  GLUCOSE 103* 117* 100*  BUN 19 23 22   CREATININE 2.03* 2.27* 2.07*  CALCIUM 7.5* 7.4* 7.8*   LFTs Recent Labs    12/24/17 0725 12/25/17 0410 12/26/17 0412  BILITOT 2.8* 1.4* 1.3*  BILIDIR 1.2*  --   --   IBILI 1.6*  --   --   ALKPHOS 54 53 60  AST 5,226* 4,785* 1,724*  ALT 2,200* 2,826* 2,039*   PROT 5.2* 4.9* 5.2*  ALBUMIN 2.8* 2.6* 2.7*   No results for input(s): LIPASE in the last 72 hours. PT/INR Recent Labs    12/24/17 0836 12/25/17 0846 12/26/17 0412  LABPROT 57.6* 34.4* 21.4*  INR 6.65* 3.44 1.87      Imaging Studies: Ct Abdomen Pelvis Wo Contrast  Result Date: 12/24/2017 CLINICAL DATA:  Cirrhosis fatty liver nausea vomiting and loose stools EXAM: CT ABDOMEN AND PELVIS WITHOUT CONTRAST TECHNIQUE: Multidetector CT imaging of the abdomen and pelvis was performed following the standard protocol without IV contrast. COMPARISON:  Ultrasound 12/23/2017, 03/09/2015 CT FINDINGS: Lower chest: Lung bases demonstrate small bilateral pleural effusions. Bibasilar atelectasis. Septal thickening suggesting mild edema. Postsurgical changes of the left breast. Cardiomegaly. Hepatobiliary: No focal hepatic abnormality. No biliary dilatation. Slight increased intraluminal density at the gallbladder. Wall thickening and surrounding fluid. Pancreas: Unremarkable. No pancreatic ductal dilatation or surrounding inflammatory changes. Spleen: Normal in size without focal abnormality. Adrenals/Urinary Tract: Adrenal glands are normal. No hydronephrosis. Perinephric fat stranding. Bladder contains a Foley catheter Stomach/Bowel: Stomach nonenlarged. No dilated small bowel. No colon wall thickening. Appendix not clearly identified. Vascular/Lymphatic: Moderate aortic atherosclerosis without aneurysm. No significantly enlarged lymph nodes. Reproductive: Uterus and bilateral adnexa are unremarkable. Other: No free air. Small amount of ascites within the abdomen and pelvis. Diffuse body wall edema consistent with anasarca. Musculoskeletal: Scoliosis of the spine. Treated compression deformity at  T11. Partial fusion L1-L2. Degenerative changes of the lumbar spine. Left hip replacement. IMPRESSION: 1. Negative for bowel obstruction or bowel wall thickening. 2. Gallbladder wall thickening with surrounding edema and  or fluid, corresponding to thickened wall noted on sonography. Findings are nonspecific and could be seen in the setting of liver disease, acute or chronic cholecystitis, and edema forming states. If acute gallbladder disease remains a concern, further evaluation with hepatobiliary nuclear medicine imaging could be obtained. 3. Anasarca with small amount of ascites in the abdomen and pelvis. 4. Small pleural effusions and bibasilar atelectasis. Septal thickening at the bases suggesting edema. Electronically Signed   By: Donavan Foil M.D.   On: 12/24/2017 00:22   Dg Chest 2 View  Result Date: 12/22/2017 CLINICAL DATA:  77 y/o  F; chest pain with intermittent nausea. EXAM: CHEST - 2 VIEW COMPARISON:  03/28/2016 CT chest.  11/16/2014 chest radiograph. FINDINGS: Mild cardiomegaly. Calcific aortic atherosclerosis. Coarse reticular opacities greatest at the lung bases compatible pulmonary fibrosis. No consolidation, effusion, or pneumothorax. T12 chronic compression deformity. Bones are otherwise unremarkable. IMPRESSION: 1. Mild cardiomegaly. 2. Basilar predominant pulmonary fibrosis.  No focal consolidation. 3.  Aortic Atherosclerosis (ICD10-I70.0). 4. Chronic T12 compression deformity. Electronically Signed   By: Kristine Garbe M.D.   On: 12/22/2017 19:34   Ct Head Wo Contrast  Result Date: 12/23/2017 CLINICAL DATA:  Acute confusion. EXAM: CT HEAD WITHOUT CONTRAST TECHNIQUE: Contiguous axial images were obtained from the base of the skull through the vertex without intravenous contrast. COMPARISON:  None. FINDINGS: Brain: Generalized atrophy. No sign of acute infarction. 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 Chronic small-vessel changes of the deep white matter. No mass lesion, hemorrhage, hydrocephalus or extra-axial collection. Vascular: There is atherosclerotic calcification of the major vessels at the base of the brain. Skull: Negative Sinuses/Orbits: Clear/normal  Other: None IMPRESSION: No acute finding by CT. Atrophy and chronic small-vessel ischemic change. 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 Electronically Signed   By: Nelson Chimes M.D.   On: 12/23/2017 19:59   US Renal  Result Date: 12/24/2017 CLINICAL DATA:  Acute renal insufficiency EXAM: RENAL ULTRASOUND COMPARISON:  CT abdomen and pelvis December 23, 2017 FINDINGS: Right Kidney: Length: 10.9 cm. Echogenicity and renal cortical thickness are within normal limits. No mass, perinephric fluid, or hydronephrosis visualized. No sonographically demonstrable calculus or ureterectasis. Left Kidney: Length: 10.7 cm. Echogenicity and renal cortical thickness are within normal limits. No perinephric fluid or hydronephrosis visualized. There is a cyst arising from the lower pole left kidney measuring 1.7 x 1.7 x 1.0 cm. No sonographically demonstrable calculus or ureterectasis. Bladder: Decompressed with Foley catheter and cannot be assessed. There is ascites.  There is a left pleural effusion. IMPRESSION: Small left renal cyst. Kidneys otherwise appear unremarkable bilaterally. Urinary bladder is decompressed and cannot be assessed at this time. There is ascites as well as a left pleural effusion evident. Electronically Signed   By: Lowella Grip III M.D.   On: 12/24/2017 10:41   Dg Chest Port 1 View  Result Date: 12/23/2017 CLINICAL DATA:  Dyspnea and confusion EXAM: PORTABLE CHEST 1 VIEW COMPARISON:  12/22/2017, 02/04/2015, CT chest 03/28/2016 FINDINGS: Cardiomegaly with vascular congestion, small pleural effusion and interstitial edema. Confluent opacity at the left base. Enlarged central pulmonary vessels. Aortic atherosclerosis. No pneumothorax. IMPRESSION: 1. Cardiomegaly with vascular congestion, small pleural effusion and mild increased interstitial opacity consistent with edema 2. Patchy consolidation at the left base may reflect atelectasis or superimposed pneumonia  Electronically Signed   By: Donavan Foil M.D.   On: 12/23/2017 19:32   US Abdomen Limited Ruq  Result Date: 12/23/2017 CLINICAL DATA:  Nausea and vomiting for 1 day EXAM: ULTRASOUND ABDOMEN LIMITED RIGHT UPPER QUADRANT COMPARISON:  Abdominal CT 03/09/2015 FINDINGS: Gallbladder: Shadowing gallstone in the neck. There is generalized gallbladder wall thickening to 10 mm (this measurement includes pericholecystic fat) without focal tenderness or wall striation. Gallbladder is incompletely distended. Common bile duct: Diameter: 4 mm Liver: No focal lesion identified. Within normal limits in parenchymal echogenicity. Portal vein is patent on color Doppler imaging with normal direction of blood flow towards the liver. Trace right pleural effusion IMPRESSION: Cholelithiasis. There is gallbladder wall thickening but this may be related to under distention of the gallbladder. No focal tenderness as would be expected for acute cholecystitis. Electronically Signed   By: Monte Fantasia M.D.   On: 12/23/2017 11:09  [2 weeks]   Assessment:  77 year old female admitted with sepsis (blood and urine cultures negative), found to have increasing transaminases, markedly elevated, clinically most consistent with ischemic hepatitis.  Viral hepatitis serologies negative.  Numbers continue to improve.  Patient with N/V and right shoulder pain prior to admission. Dr. Arnoldo Morale has evaluated patient, does not feel she has acute cholecystitis. She was on Xarelto prior to admission for A. Fib. She develop coagulopathy with INR 6 two days ago. Likely unrelated to Xarelto. INR much improved today.   Patient complains of lower abdominal pain when coughing. Some loose stool with coughing. C/o swelling. Weight up 30 pounds this admission??? She weighed 150 as outpatient in 07/2017. 154 on admission. 185-187 the past 3 days. Patient feels "tight".   Plan: 1. Recheck LFTs/INR in AM.  2. Monitor for diarrhea.  3. Electrolyte and Fluid  management per attending.  4. If clinical concern for cholecystitis, consider HIDA.   Laureen Ochs. Bernarda Caffey [facility] [phone] 9/18/20199:38 AM     LOS: 4 days

## 2017-12-26 NOTE — Progress Notes (Signed)
Pharmacy Antibiotic Note  Karina Tran is a 77 y.o. female admitted on 12/22/2017 with sepsis.  Pharmacy has been consulted for  zosyn dosing.  Plan: Zosyn 3.375g IV every 8 hours Monitor labs, c/s, and patient improvement   Height: 5\' 4"  (162.6 cm) Weight: 185 lb 3 oz (84 kg) IBW/kg (Calculated) : 54.7  Temp (24hrs), Avg:98.9 F (37.2 C), Min:98.5 F (36.9 C), Max:99.2 F (37.3 C)  Recent Labs  Lab 12/22/17 2033 12/23/17 0405 12/23/17 0406 12/23/17 0901 12/23/17 2008 12/23/17 2255 12/24/17 0126 12/24/17 0346 12/24/17 1349 12/25/17 0410 12/26/17 0412  WBC  --  8.2  --   --  10.7*  --  12.4*  --   --  11.3* 9.6  CREATININE  --  1.19*  --   --  1.86*  --   --  1.86* 2.03* 2.27* 2.07*  LATICACIDVEN 3.9*  --  2.9* 3.0* 4.2* 3.7*  --   --   --   --   --     Estimated Creatinine Clearance: 23.5 mL/min (A) (by C-G formula based on SCr of 2.07 mg/dL (H)).    Allergies  Allergen Reactions  . Aspirin Other (See Comments)    Bloody noses  . Blue Dyes (Parenteral) Nausea And Vomiting  . Demerol [Meperidine] Nausea And Vomiting    Antimicrobials this admission: Vancomycin 9/14 >>9/16 Zosyn 9/14 >>   Dose adjustments this admission: N/A  Microbiology results: 9/14 BCx: ngtd 9/14 UCx: ng 9/14 MRSA PCR: negative  Thank you for allowing pharmacy to be a part of this patient's care.  Margot Ables, PharmD Clinical Pharmacist 12/26/2017 8:19 AM

## 2017-12-26 NOTE — Care Management Important Message (Signed)
Important Message  Patient Details  Name: Stepahnie Campo MRN: [id number] Date of Birth: 1940-11-20   Medicare Important Message Given:  Yes    [name] 12/26/2017, 11:44 AM

## 2017-12-26 NOTE — Progress Notes (Signed)
PROGRESS NOTE  Karina Tran NVB:[id number] DOB: July 27, 1940 DOA: 12/22/2017 PCP: Curly Rim, MD  Brief History: 77 year old female with a history of persistent atrial fibrillation, hypertension, interstitial lung disease presenting with acute onset of nausea, vomiting, generalized weakness, diaphoresis that began around 2 PM on 12/22/2017. The patient had been in her usual state of health up until 2 PM on 12/22/2017. The patient decided to go take a nap after brunch on 12/22/2017. She woke up to go to the bathroom. At that time, the patient felt lightheaded and dizzy. She subsequently had a loose stool without any hematochezia or melena. When she went back to her bedroom, the patient had generalized weakness with diaphoresis, but she denied any chest discomfort, shortness of breath, visual disturbance, focal extremity weakness. As result, EMS was activated. The patient denied any recent travels, exotic or undercooked foods, or sick contacts. She lives with her daughter and son-in-law and her grandchildren all of whom are in good health. She denies any fevers, chills, abdominal pain, dysuria, hematuria, hematochezia, melena, rashes, synovitis. She has not had any recent travels. She has not had any recent changes in any of her medications. The patient had numerous episodes of dry heaves on 12/22/2017 which continued in the emergency department. In the emergency department, the patient was noted to be afebrile but hypotensive with blood pressure of 77/58. She was saturating 100% on room air. She was noted to have elevated LFTs with AST 97, ALT 51, alk phosphatase 85, total bilirubin 1.1 at the time of admission. WBC was 10.2. Lactic acid peaked at 3.9. She was started on vancomycin and Zosyn after blood cultures and urine cultures were obtained. Chest x-ray showed pulmonary fibrosis without any consolidations. Notably, the patient developed vague right shoulder pain that  she described as moderate, intermittent, with a dull ache sensation.  Assessment/Plan: Sepsis -concerned about cholecystitis -general surgery consulted--discussed with Dr. Aviva Signs -blood cultures remain neg -PCT <0.10 -lactic acid peaked 4.2 -check Coags although this will be elevated partly due to pt being on Xarelto -hold xarelto for now due to coagulopathy -check cortisol--28.8 -no convincing evidence of infectious process. Will discontinue further antibiotics  Transaminitis -likely related to ischemic hepatitis -12/23/17 RUQ US--GB wall thickening with cholelithiasis, possible GB neck stone -12/24/17 CT abd--GB wall thickening with surrounding fluid--?cholecystitis vs ascites/anasarca -discussed with general surgery and unlikely this is related to her gall bladder -GI consult appreciated -viral hepatitis panel unrevealing -overall transaminases improving  AKI -due to sepsis and hemodynamic changes -renal US--no hydronephrosis -Baseline creatinine 0.6-0.8 -expecting serum creatinine to reach plateau 12/26/17 -was receiving bicarbonate infusion for metabolic acidosis, which was stopped on 9/17 -continue to monitor.  Coagulopathy -due to rivaroxaban and liver injury -vitamin K x 1 on9/16/19 -daily INR is trending down  Elevated troponin -no chest pain -personally reviewed EKG--sinus, nonspecific T wave changes -due to demand ischemia  Acute on chronic combined CHF.  -patient continues to have evidence of volume overload -chest xray shows persistent CHF -she did receive IV hydration through hospitalization -continue on IV lasix -monitor intake and output -Echo shows EF of 45% with grade 2 DD  Cardiomyopathy -suspect related to Etoh, stress -12/24/17 Echo--EF 45%, diffuse HK, G2DD, PASP 33 -will ultimately need cardiology follow up  Pyuria -urine culture neg  Persistent atrial fibrillation -Holding atenolol secondary to hypotension -Continue  flecainide -Hold rivaroxaban until coagulopathy resolved  Hyperglycemia -Hemoglobin A1c--5.4  Interstitial Lung Disease -stable on RA  Etoh Dependence -12/24/17 pt revealed she drinks 6 pack of beer on sat and sun -CIWA -no signs of withdrawal at present  Hyponatremia -due to AKI and defect in Na reabsorption -resolved    Disposition Plan: remain in stepdown  Family Communication:NoFamily at bedside  Consultants:none  Code Status: FULL  DVT Prophylaxis:SCD   Procedures: As Listed in Progress Note Above  Antibiotics: vanco 9/14>>>9/16 Zosyn 9/14>>>9/18  Subjective: Has some wheezing, coughing. Describes some loose stools which have been frequent.  Objective: Vitals:   12/26/17 1419 12/26/17 1500 12/26/17 1600 12/26/17 1637  BP:  (!) 143/72 119/74   Pulse:  77 71 71  Resp:  (!) 21 (!) 30 (!) 23  Temp:    97.9 F (36.6 C)  TempSrc:    Oral  SpO2: 98% 96% 100% 99%  Weight:      Height:        Intake/Output Summary (Last 24 hours) at 12/26/2017 1847 Last data filed at 12/26/2017 1616 Gross per 24 hour  Intake 276.15 ml  Output 2000 ml  Net -1723.85 ml   Weight change: -0.9 kg Exam:  General exam: Alert, awake, oriented x 3 Respiratory system: bilateral wheezing Respiratory effort normal. Cardiovascular system:RRR. No murmurs, rubs, gallops. Gastrointestinal system: Abdomen is nondistended, soft and nontender. No organomegaly or masses felt. Normal bowel sounds heard. Central nervous system: Alert and oriented. No focal neurological deficits. Extremities: No C/C/E, +pedal pulses Skin: No rashes, lesions or ulcers  Psychiatry: Judgement and insight appear normal. Mood & affect appropriate.      Data Reviewed: I have personally reviewed following labs and imaging studies Basic Metabolic Panel: Recent Labs  Lab 12/23/17 2008 12/24/17 0346 12/24/17 1349 12/25/17 0410 12/26/17 0412  NA 134* 131* 132* 135 137  K 3.7 5.1  4.2 3.5 2.4*  CL 108 104 102 103 98  CO2 16* 15* 16* 22 25  GLUCOSE 142* 112* 103* 117* 100*  BUN _0 CREATININE 1.86* 1.86* 2.03* 2.27* 2.07*  CALCIUM 7.5* 7.5* 7.5* 7.4* 7.8*  MG  --   --   --   --  1.4*   Liver Function Tests: Recent Labs  Lab 12/23/17 2008 12/24/17 0346 12/24/17 0725 12/25/17 0410 12/26/17 0412  AST 1,376* 3,441* 5,226* 4,785* 1,724*  ALT 753* 1,547* 2,200* 2,826* 2,039*  ALKPHOS 56 55 54 53 60  BILITOT 1.2 2.7* 2.8* 1.4* 1.3*  PROT 5.1* 5.2* 5.2* 4.9* 5.2*  ALBUMIN 2.7* 2.8* 2.8* 2.6* 2.7*   Recent Labs  Lab 12/23/17 0901  LIPASE 26   Recent Labs  Lab 12/23/17 2255  AMMONIA 20   Coagulation Profile: Recent Labs  Lab 12/24/17 0836 12/25/17 0846 12/26/17 0412  INR 6.65* 3.44 1.87   CBC: Recent Labs  Lab 12/22/17 1745 12/23/17 0405 12/23/17 2008 12/24/17 0126 12/25/17 0410 12/26/17 0412  WBC 10.2 8.2 10.7* 12.4* 11.3* 9.6  NEUTROABS 7.6  --  7.6 9.8*  --   --   HGB 14.5 12.9 12.9 13.3 12.4 12.3  HCT 43.9 40.6 40.6 41.0 35.9* 36.8  MCV 94.2 93.8 95.8 94.9 91.8 92.2  PLT 223 205 191 188 155 162   Cardiac Enzymes: Recent Labs  Lab 12/23/17 0405 12/23/17 0901 12/23/17 2008 12/24/17 0126 12/24/17 0725  CKTOTAL  --  22*  --   --   --   TROPONINI <0.03 0.03* 0.03* 0.03* 0.03*   BNP: Invalid input(s): POCBNP CBG: Recent Labs  Lab 12/23/17 1901 12/25/17 0409  GLUCAP 126*  109*   HbA1C: No results for input(s): HGBA1C in the last 72 hours. Urine analysis:    Component Value Date/Time   COLORURINE AMBER (A) 12/22/2017 1838   APPEARANCEUR CLOUDY (A) 12/22/2017 1838   LABSPEC 1.017 12/22/2017 1838   PHURINE 5.0 12/22/2017 1838   GLUCOSEU 50 (A) 12/22/2017 1838   HGBUR NEGATIVE 12/22/2017 1838   BILIRUBINUR NEGATIVE 12/22/2017 1838   KETONESUR NEGATIVE 12/22/2017 1838   PROTEINUR >=300 (A) 12/22/2017 1838   UROBILINOGEN 1.0 02/04/2015 1533   NITRITE NEGATIVE 12/22/2017 1838   LEUKOCYTESUR NEGATIVE 12/22/2017  1838   Sepsis Labs: _0 (procalcitonin:4,lacticidven:4) ) Recent Results (from the past 240 hour(s))  Blood culture (routine x 2)     Status: None (Preliminary result)   Collection Time: 12/22/17  6:45 PM  Result Value Ref Range Status   Specimen Description RIGHT ANTECUBITAL  Final   Special Requests   Final    BOTTLES DRAWN AEROBIC AND ANAEROBIC Blood Culture adequate volume   Culture   Final    NO GROWTH 4 DAYS Performed at [facility], [address] [place], [place] [address]    Report Status PENDING  Incomplete  Blood culture (routine x 2)     Status: None (Preliminary result)   Collection Time: 12/22/17  7:30 PM  Result Value Ref Range Status   Specimen Description RIGHT ANTECUBITAL  Final   Special Requests   Final    BOTTLES DRAWN AEROBIC ONLY Blood Culture adequate volume   Culture   Final    NO GROWTH 4 DAYS Performed at [facility], [address], [place], [place] [address]    Report Status PENDING  Incomplete  Urine culture     Status: None   Collection Time: 12/22/17  9:31 PM  Result Value Ref Range Status   Specimen Description   Final    URINE, RANDOM Performed at [facility], [address], [place], [place] [address]    Special Requests   Final    NONE Performed at [facility], [address] [place], [place] [address]    Culture   Final    NO GROWTH Performed at [facility], [place] [address], [place], [place] [address]    Report Status 12/24/2017 FINAL  Final  MRSA PCR Screening     Status: None   Collection Time: 12/22/17 11:18 PM  Result Value Ref Range Status   MRSA by PCR NEGATIVE NEGATIVE Final    Comment:        The GeneXpert MRSA Assay (FDA approved for NASAL specimens only), is one component of a comprehensive MRSA colonization surveillance program. It is not intended to diagnose MRSA infection nor to guide or monitor treatment for MRSA infections. Performed at [facility], [address] [place], [place] [address]     Culture, blood (x 2)     Status: None (Preliminary result)   Collection Time: 12/23/17 10:57 PM  Result Value Ref Range Status   Specimen Description BLOOD RIGHT HAND  Final   Special Requests   Final    BOTTLES DRAWN AEROBIC ONLY Blood Culture adequate volume   Culture   Final    NO GROWTH 3 DAYS Performed at [facility], [address], [place], [place] [address]    Report Status PENDING  Incomplete  Culture, blood (x 2)     Status: None (Preliminary result)   Collection Time: 12/23/17 10:57 PM  Result Value Ref Range Status   Specimen Description RIGHT ANTECUBITAL  Final  Special Requests   Final    BOTTLES DRAWN AEROBIC AND ANAEROBIC Blood Culture results may not be optimal due to an inadequate volume of blood received in culture bottles   Culture   Final    NO GROWTH 3 DAYS Performed at [facility], [address] [place], [place] [address]    Report Status PENDING  Incomplete     Scheduled Meds: . chlorhexidine  15 mL Mouth Rinse BID  . flecainide  50 mg Oral Q12H  . furosemide  20 mg Intravenous BID  . ipratropium-albuterol  3 mL Nebulization TID  . mouth rinse  15 mL Mouth Rinse q12n4p  . methylPREDNISolone (SOLU-MEDROL) injection  60 mg Intravenous Q12H  . metoCLOPramide (REGLAN) injection  5 mg Intravenous Q8H  . pantoprazole  40 mg Oral q morning - 10a  . potassium chloride  40 mEq Oral TID   Continuous Infusions: . sodium chloride Stopped (12/26/17 1417)    Procedures/Studies: Ct Abdomen Pelvis Wo Contrast  Result Date: 12/24/2017 CLINICAL DATA:  Cirrhosis fatty liver nausea vomiting and loose stools EXAM: CT ABDOMEN AND PELVIS WITHOUT CONTRAST TECHNIQUE: Multidetector CT imaging of the abdomen and pelvis was performed following the standard protocol without IV contrast. COMPARISON:  Ultrasound 12/23/2017, 03/09/2015 CT FINDINGS: Lower chest: Lung bases demonstrate small bilateral pleural effusions. Bibasilar atelectasis. Septal thickening suggesting mild  edema. Postsurgical changes of the left breast. Cardiomegaly. Hepatobiliary: No focal hepatic abnormality. No biliary dilatation. Slight increased intraluminal density at the gallbladder. Wall thickening and surrounding fluid. Pancreas: Unremarkable. No pancreatic ductal dilatation or surrounding inflammatory changes. Spleen: Normal in size without focal abnormality. Adrenals/Urinary Tract: Adrenal glands are normal. No hydronephrosis. Perinephric fat stranding. Bladder contains a Foley catheter Stomach/Bowel: Stomach nonenlarged. No dilated small bowel. No colon wall thickening. Appendix not clearly identified. Vascular/Lymphatic: Moderate aortic atherosclerosis without aneurysm. No significantly enlarged lymph nodes. Reproductive: Uterus and bilateral adnexa are unremarkable. Other: No free air. Small amount of ascites within the abdomen and pelvis. Diffuse body wall edema consistent with anasarca. Musculoskeletal: Scoliosis of the spine. Treated compression deformity at T11. Partial fusion L1-L2. Degenerative changes of the lumbar spine. Left hip replacement. IMPRESSION: 1. Negative for bowel obstruction or bowel wall thickening. 2. Gallbladder wall thickening with surrounding edema and or fluid, corresponding to thickened wall noted on sonography. Findings are nonspecific and could be seen in the setting of liver disease, acute or chronic cholecystitis, and edema forming states. If acute gallbladder disease remains a concern, further evaluation with hepatobiliary nuclear medicine imaging could be obtained. 3. Anasarca with small amount of ascites in the abdomen and pelvis. 4. Small pleural effusions and bibasilar atelectasis. Septal thickening at the bases suggesting edema. Electronically Signed   By: Donavan Foil M.D.   On: 12/24/2017 00:22   Dg Chest 2 View  Result Date: 12/22/2017 CLINICAL DATA:  77 y/o  F; chest pain with intermittent nausea. EXAM: CHEST - 2 VIEW COMPARISON:  03/28/2016 CT chest.   11/16/2014 chest radiograph. FINDINGS: Mild cardiomegaly. Calcific aortic atherosclerosis. Coarse reticular opacities greatest at the lung bases compatible pulmonary fibrosis. No consolidation, effusion, or pneumothorax. T12 chronic compression deformity. Bones are otherwise unremarkable. IMPRESSION: 1. Mild cardiomegaly. 2. Basilar predominant pulmonary fibrosis.  No focal consolidation. 3.  Aortic Atherosclerosis (ICD10-I70.0). 4. Chronic T12 compression deformity. Electronically Signed   By: Kristine Garbe M.D.   On: 12/22/2017 19:34   Ct Head Wo Contrast  Result Date: 12/23/2017 CLINICAL DATA:  Acute confusion. EXAM: CT HEAD WITHOUT CONTRAST TECHNIQUE: Contiguous  axial images were obtained from the base of the skull through the vertex without intravenous contrast. COMPARISON:  None. FINDINGS: Brain: Generalized atrophy. No sign of acute infarction. 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 Chronic small-vessel changes of the deep white matter. No mass lesion, hemorrhage, hydrocephalus or extra-axial collection. Vascular: There is atherosclerotic calcification of the major vessels at the base of the brain. Skull: Negative Sinuses/Orbits: Clear/normal Other: None IMPRESSION: No acute finding by CT. Atrophy and chronic small-vessel ischemic change. 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 Electronically Signed   By: Nelson Chimes M.D.   On: 12/23/2017 19:59   US Renal  Result Date: 12/24/2017 CLINICAL DATA:  Acute renal insufficiency EXAM: RENAL ULTRASOUND COMPARISON:  CT abdomen and pelvis December 23, 2017 FINDINGS: Right Kidney: Length: 10.9 cm. Echogenicity and renal cortical thickness are within normal limits. No mass, perinephric fluid, or hydronephrosis visualized. No sonographically demonstrable calculus or ureterectasis. Left Kidney: Length: 10.7 cm. Echogenicity and renal cortical thickness are within normal limits. No perinephric fluid or  hydronephrosis visualized. There is a cyst arising from the lower pole left kidney measuring 1.7 x 1.7 x 1.0 cm. No sonographically demonstrable calculus or ureterectasis. Bladder: Decompressed with Foley catheter and cannot be assessed. There is ascites.  There is a left pleural effusion. IMPRESSION: Small left renal cyst. Kidneys otherwise appear unremarkable bilaterally. Urinary bladder is decompressed and cannot be assessed at this time. There is ascites as well as a left pleural effusion evident. Electronically Signed   By: Lowella Grip III M.D.   On: 12/24/2017 10:41   Dg Chest Port 1 View  Result Date: 12/26/2017 CLINICAL DATA:  Wheezing and cough and pleuritic abdominal pain. History of atrial fibrillation, interstitial lung disease, breast malignancy. EXAM: PORTABLE CHEST 1 VIEW COMPARISON:  Portable chest x-ray of December 23, 2017 FINDINGS: The lungs are mildly hypoinflated. The cardiac silhouette is enlarged. The pulmonary vascularity is engorged. There small bilateral pleural effusions. There is calcification in the wall of the aortic arch. The bony thorax exhibits no acute abnormality. IMPRESSION: CHF with mild interstitial edema and small bilateral pleural effusions. The appearance of the chest has deteriorated slightly since the study of 3 days ago. Thoracic aortic atherosclerosis. Electronically Signed   By: David  Martinique M.D.   On: 12/26/2017 15:43   Dg Chest Port 1 View  Result Date: 12/23/2017 CLINICAL DATA:  Dyspnea and confusion EXAM: PORTABLE CHEST 1 VIEW COMPARISON:  12/22/2017, 02/04/2015, CT chest 03/28/2016 FINDINGS: Cardiomegaly with vascular congestion, small pleural effusion and interstitial edema. Confluent opacity at the left base. Enlarged central pulmonary vessels. Aortic atherosclerosis. No pneumothorax. IMPRESSION: 1. Cardiomegaly with vascular congestion, small pleural effusion and mild increased interstitial opacity consistent with edema 2. Patchy consolidation at  the left base may reflect atelectasis or superimposed pneumonia Electronically Signed   By: Donavan Foil M.D.   On: 12/23/2017 19:32   US Abdomen Limited Ruq  Result Date: 12/23/2017 CLINICAL DATA:  Nausea and vomiting for 1 day EXAM: ULTRASOUND ABDOMEN LIMITED RIGHT UPPER QUADRANT COMPARISON:  Abdominal CT 03/09/2015 FINDINGS: Gallbladder: Shadowing gallstone in the neck. There is generalized gallbladder wall thickening to 10 mm (this measurement includes pericholecystic fat) without focal tenderness or wall striation. Gallbladder is incompletely distended. Common bile duct: Diameter: 4 mm Liver: No focal lesion identified. Within normal limits in parenchymal echogenicity. Portal vein is patent on color Doppler imaging with normal direction of blood flow towards the liver. Trace right pleural effusion IMPRESSION: Cholelithiasis. There is  gallbladder wall thickening but this may be related to under distention of the gallbladder. No focal tenderness as would be expected for acute cholecystitis. Electronically Signed   By: Monte Fantasia M.D.   On: 12/23/2017 11:09    Kathie Dike, MD  [facility] Pager [phone]  If 7PM-7AM, please contact night-coverage [email or web address] Password [facility] 12/26/2017, 6:47 PM   LOS: 4 days

## 2017-12-27 LAB — COMPREHENSIVE METABOLIC PANEL
ALT: 1497 U/L — ABNORMAL HIGH (ref 0–44)
AST: 715 U/L — AB (ref 15–41)
Albumin: 2.9 g/dL — ABNORMAL LOW (ref 3.5–5.0)
Alkaline Phosphatase: 98 U/L (ref 38–126)
Anion gap: 11 (ref 5–15)
BILIRUBIN TOTAL: 1.5 mg/dL — AB (ref 0.3–1.2)
BUN: 20 mg/dL (ref 8–23)
CALCIUM: 8.3 mg/dL — AB (ref 8.9–10.3)
CO2: 28 mmol/L (ref 22–32)
Chloride: 98 mmol/L (ref 98–111)
Creatinine, Ser: 1.69 mg/dL — ABNORMAL HIGH (ref 0.44–1.00)
GFR calc Af Amer: 32 mL/min — ABNORMAL LOW (ref >60)
GFR calc non Af Amer: 28 mL/min — ABNORMAL LOW (ref >60)
Glucose, Bld: 205 mg/dL — ABNORMAL HIGH (ref 70–99)
POTASSIUM: 3.5 mmol/L (ref 3.5–5.1)
Sodium: 137 mmol/L (ref 135–145)
Total Protein: 5.7 g/dL — ABNORMAL LOW (ref 6.5–8.1)

## 2017-12-27 LAB — CULTURE, BLOOD (ROUTINE X 2)
Culture: NO GROWTH
Culture: NO GROWTH
Special Requests: ADEQUATE
Special Requests: ADEQUATE

## 2017-12-27 LAB — PROTIME-INR
INR: 1.43
PROTHROMBIN TIME: 17.3 s — AB (ref 11.4–15.2)

## 2017-12-27 LAB — MAGNESIUM: MAGNESIUM: 2.2 mg/dL (ref 1.7–2.4)

## 2017-12-27 MED ORDER — RIVAROXABAN 15 MG PO TABS
15.0000 mg | ORAL_TABLET | Freq: Every day | ORAL | Status: DC
Start: 2017-12-27 — End: 2018-01-01
  Administered 2017-12-27 – 2018-01-01 (×6): 15 mg via ORAL
  Filled 2017-12-27 (×6): qty 1

## 2017-12-27 MED ORDER — LOPERAMIDE HCL 2 MG PO CAPS
4.0000 mg | ORAL_CAPSULE | Freq: Once | ORAL | Status: AC
  Administered 2017-12-27: 4 mg via ORAL
  Filled 2017-12-27: qty 2

## 2017-12-27 NOTE — Progress Notes (Signed)
Subjective:  Feels much better today. Has pain with coughing, involves entire abd. Appetite better.   Objective: Vital signs in last 24 hours: Temp:  [97.3 F (36.3 C)-99.2 F (37.3 C)] 98.1 F (36.7 C) (09/19 0500) Pulse Rate:  [71-80] 73 (09/18 2100) Resp:  [17-30] 19 (09/18 2100) BP: (119-162)/(58-85) 134/74 (09/18 2100) SpO2:  [94 %-100 %] 100 % (09/19 0831) Weight:  [82.8 kg] 82.8 kg (09/19 0500) Last BM Date: 12/26/17 General:   Alert,  Well-developed, well-nourished, pleasant and cooperative in NAD. Sitting up eating.  Head:  Normocephalic and atraumatic. Eyes:  Sclera clear, no icterus.  Abdomen:  Soft, mild lower abd tenderness and nondistended.  Normal bowel sounds, without guarding, and without rebound.   Extremities:  Without clubbing, deformity. Trace pitting edema LE bilaterally. Neurologic:  Alert and  oriented x4;  grossly normal neurologically. Skin:  Intact without significant lesions or rashes. Psych:  Alert and cooperative. Normal mood and affect.  Intake/Output from previous day: 09/18 0701 - 09/19 0700 In: 286.5 [I.V.:111.9; IV Piggyback:174.5] Out: -  Intake/Output this shift: No intake/output data recorded.  Lab Results: CBC Recent Labs    12/25/17 0410 12/26/17 0412  WBC 11.3* 9.6  HGB 12.4 12.3  HCT 35.9* 36.8  MCV 91.8 92.2  PLT 155 162   BMET Recent Labs    12/25/17 0410 12/26/17 0412 12/27/17 0406  NA 135 137 137  K 3.5 2.4* 3.5  CL 103 98 98  CO2 22 25 28   GLUCOSE 117* 100* 205*  BUN 23 22 20   CREATININE 2.27* 2.07* 1.69*  CALCIUM 7.4* 7.8* 8.3*   LFTs Recent Labs    12/25/17 0410 12/26/17 0412 12/27/17 0406  BILITOT 1.4* 1.3* 1.5*  ALKPHOS 53 60 98  AST 4,785* 1,724* 715*  ALT 2,826* 2,039* 1,497*  PROT 4.9* 5.2* 5.7*  ALBUMIN 2.6* 2.7* 2.9*   No results for input(s): LIPASE in the last 72 hours. PT/INR Recent Labs    12/25/17 0846 12/26/17 0412 12/27/17 0406  LABPROT 34.4* 21.4* 17.3*  INR 3.44 1.87 1.43       Imaging Studies: Ct Abdomen Pelvis Wo Contrast  Result Date: 12/24/2017 CLINICAL DATA:  Cirrhosis fatty liver nausea vomiting and loose stools EXAM: CT ABDOMEN AND PELVIS WITHOUT CONTRAST TECHNIQUE: Multidetector CT imaging of the abdomen and pelvis was performed following the standard protocol without IV contrast. COMPARISON:  Ultrasound 12/23/2017, 03/09/2015 CT FINDINGS: Lower chest: Lung bases demonstrate small bilateral pleural effusions. Bibasilar atelectasis. Septal thickening suggesting mild edema. Postsurgical changes of the left breast. Cardiomegaly. Hepatobiliary: No focal hepatic abnormality. No biliary dilatation. Slight increased intraluminal density at the gallbladder. Wall thickening and surrounding fluid. Pancreas: Unremarkable. No pancreatic ductal dilatation or surrounding inflammatory changes. Spleen: Normal in size without focal abnormality. Adrenals/Urinary Tract: Adrenal glands are normal. No hydronephrosis. Perinephric fat stranding. Bladder contains a Foley catheter Stomach/Bowel: Stomach nonenlarged. No dilated small bowel. No colon wall thickening. Appendix not clearly identified. Vascular/Lymphatic: Moderate aortic atherosclerosis without aneurysm. No significantly enlarged lymph nodes. Reproductive: Uterus and bilateral adnexa are unremarkable. Other: No free air. Small amount of ascites within the abdomen and pelvis. Diffuse body wall edema consistent with anasarca. Musculoskeletal: Scoliosis of the spine. Treated compression deformity at T11. Partial fusion L1-L2. Degenerative changes of the lumbar spine. Left hip replacement. IMPRESSION: 1. Negative for bowel obstruction or bowel wall thickening. 2. Gallbladder wall thickening with surrounding edema and or fluid, corresponding to thickened wall noted on sonography. Findings are nonspecific and could be seen in  the setting of liver disease, acute or chronic cholecystitis, and edema forming states. If acute gallbladder  disease remains a concern, further evaluation with hepatobiliary nuclear medicine imaging could be obtained. 3. Anasarca with small amount of ascites in the abdomen and pelvis. 4. Small pleural effusions and bibasilar atelectasis. Septal thickening at the bases suggesting edema. Electronically Signed   By: Donavan Foil M.D.   On: 12/24/2017 00:22   Dg Chest 2 View  Result Date: 12/22/2017 CLINICAL DATA:  77 y/o  F; chest pain with intermittent nausea. EXAM: CHEST - 2 VIEW COMPARISON:  03/28/2016 CT chest.  11/16/2014 chest radiograph. FINDINGS: Mild cardiomegaly. Calcific aortic atherosclerosis. Coarse reticular opacities greatest at the lung bases compatible pulmonary fibrosis. No consolidation, effusion, or pneumothorax. T12 chronic compression deformity. Bones are otherwise unremarkable. IMPRESSION: 1. Mild cardiomegaly. 2. Basilar predominant pulmonary fibrosis.  No focal consolidation. 3.  Aortic Atherosclerosis (ICD10-I70.0). 4. Chronic T12 compression deformity. Electronically Signed   By: Kristine Garbe M.D.   On: 12/22/2017 19:34   Ct Head Wo Contrast  Result Date: 12/23/2017 CLINICAL DATA:  Acute confusion. EXAM: CT HEAD WITHOUT CONTRAST TECHNIQUE: Contiguous axial images were obtained from the base of the skull through the vertex without intravenous contrast. COMPARISON:  None. FINDINGS: Brain: Generalized atrophy. No sign of acute infarction. 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 Chronic small-vessel changes of the deep white matter. No mass lesion, hemorrhage, hydrocephalus or extra-axial collection. Vascular: There is atherosclerotic calcification of the major vessels at the base of the brain. Skull: Negative Sinuses/Orbits: Clear/normal Other: None IMPRESSION: No acute finding by CT. Atrophy and chronic small-vessel ischemic change. 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 Electronically Signed   By: Nelson Chimes M.D.   On:  12/23/2017 19:59   US Renal  Result Date: 12/24/2017 CLINICAL DATA:  Acute renal insufficiency EXAM: RENAL ULTRASOUND COMPARISON:  CT abdomen and pelvis December 23, 2017 FINDINGS: Right Kidney: Length: 10.9 cm. Echogenicity and renal cortical thickness are within normal limits. No mass, perinephric fluid, or hydronephrosis visualized. No sonographically demonstrable calculus or ureterectasis. Left Kidney: Length: 10.7 cm. Echogenicity and renal cortical thickness are within normal limits. No perinephric fluid or hydronephrosis visualized. There is a cyst arising from the lower pole left kidney measuring 1.7 x 1.7 x 1.0 cm. No sonographically demonstrable calculus or ureterectasis. Bladder: Decompressed with Foley catheter and cannot be assessed. There is ascites.  There is a left pleural effusion. IMPRESSION: Small left renal cyst. Kidneys otherwise appear unremarkable bilaterally. Urinary bladder is decompressed and cannot be assessed at this time. There is ascites as well as a left pleural effusion evident. Electronically Signed   By: Lowella Grip III M.D.   On: 12/24/2017 10:41   Dg Chest Port 1 View  Result Date: 12/26/2017 CLINICAL DATA:  Wheezing and cough and pleuritic abdominal pain. History of atrial fibrillation, interstitial lung disease, breast malignancy. EXAM: PORTABLE CHEST 1 VIEW COMPARISON:  Portable chest x-ray of December 23, 2017 FINDINGS: The lungs are mildly hypoinflated. The cardiac silhouette is enlarged. The pulmonary vascularity is engorged. There small bilateral pleural effusions. There is calcification in the wall of the aortic arch. The bony thorax exhibits no acute abnormality. IMPRESSION: CHF with mild interstitial edema and small bilateral pleural effusions. The appearance of the chest has deteriorated slightly since the study of 3 days ago. Thoracic aortic atherosclerosis. Electronically Signed   By: David  Martinique M.D.   On: 12/26/2017 15:43   Dg Chest Port 1  View  Result Date: 12/23/2017 CLINICAL DATA:  Dyspnea and confusion EXAM: PORTABLE CHEST 1 VIEW COMPARISON:  12/22/2017, 02/04/2015, CT chest 03/28/2016 FINDINGS: Cardiomegaly with vascular congestion, small pleural effusion and interstitial edema. Confluent opacity at the left base. Enlarged central pulmonary vessels. Aortic atherosclerosis. No pneumothorax. IMPRESSION: 1. Cardiomegaly with vascular congestion, small pleural effusion and mild increased interstitial opacity consistent with edema 2. Patchy consolidation at the left base may reflect atelectasis or superimposed pneumonia Electronically Signed   By: Donavan Foil M.D.   On: 12/23/2017 19:32   US Abdomen Limited Ruq  Result Date: 12/23/2017 CLINICAL DATA:  Nausea and vomiting for 1 day EXAM: ULTRASOUND ABDOMEN LIMITED RIGHT UPPER QUADRANT COMPARISON:  Abdominal CT 03/09/2015 FINDINGS: Gallbladder: Shadowing gallstone in the neck. There is generalized gallbladder wall thickening to 10 mm (this measurement includes pericholecystic fat) without focal tenderness or wall striation. Gallbladder is incompletely distended. Common bile duct: Diameter: 4 mm Liver: No focal lesion identified. Within normal limits in parenchymal echogenicity. Portal vein is patent on color Doppler imaging with normal direction of blood flow towards the liver. Trace right pleural effusion IMPRESSION: Cholelithiasis. There is gallbladder wall thickening but this may be related to under distention of the gallbladder. No focal tenderness as would be expected for acute cholecystitis. Electronically Signed   By: Monte Fantasia M.D.   On: 12/23/2017 11:09  [2 weeks]   Assessment: 77 year old female admitted with sepsis, found to have increasing transaminitis, markedly elevated, clinically most consistent with ischemic hepatitis.  Viral markers negative.  She developed coagulopathy with an INR greater than 6 but this has improved as well as LFTs.  Clinically she is much  improved.  Denies any postprandial abdominal pain.  Plan: 1. Would follow LFTs to normal.  Given significant tremor downward, LFTs can be checked in the next week or so as an outpatient.  She plans to have this done with her PCP and faxed to our office.  Fax number provided.   2. We will make arrangements for outpatient follow-up. 3. Will sign off for now. Call with any questions.   Laureen Ochs. [facility] [phone] [id number]/19/[id number] AM     LOS: 5 days

## 2017-12-27 NOTE — Progress Notes (Signed)
PROGRESS NOTE  Karina Tran QMG:[id number] DOB: 1941/02/22 DOA: 12/22/2017 PCP: Curly Rim, MD  Brief History: 77 year old female with a history of persistent atrial fibrillation, hypertension, interstitial lung disease presenting with acute onset of nausea, vomiting, generalized weakness, diaphoresis that began around 2 PM on 12/22/2017. The patient had been in her usual state of health up until 2 PM on 12/22/2017. The patient decided to go take a nap after brunch on 12/22/2017. She woke up to go to the bathroom. At that time, the patient felt lightheaded and dizzy. She subsequently had a loose stool without any hematochezia or melena. When she went back to her bedroom, the patient had generalized weakness with diaphoresis, but she denied any chest discomfort, shortness of breath, visual disturbance, focal extremity weakness. As result, EMS was activated. The patient denied any recent travels, exotic or undercooked foods, or sick contacts. She lives with her daughter and son-in-law and her grandchildren all of whom are in good health. She denies any fevers, chills, abdominal pain, dysuria, hematuria, hematochezia, melena, rashes, synovitis. She has not had any recent travels. She has not had any recent changes in any of her medications. The patient had numerous episodes of dry heaves on 12/22/2017 which continued in the emergency department. In the emergency department, the patient was noted to be afebrile but hypotensive with blood pressure of 77/58. She was saturating 100% on room air. She was noted to have elevated LFTs with AST 97, ALT 51, alk phosphatase 85, total bilirubin 1.1 at the time of admission. WBC was 10.2. Lactic acid peaked at 3.9. She was started on vancomycin and Zosyn after blood cultures and urine cultures were obtained. Chest x-ray showed pulmonary fibrosis without any consolidations.   Assessment/Plan: Sepsis -general surgery consulted for  concerns for cholecystitis--discussed with Dr. Aviva Signs, unlikely to be related to her gall bladder -blood cultures remain neg -PCT <0.10 -lactic acid peaked 4.2, but has since improved -check cortisol--28.8 -no convincing evidence of infectious process. Antibiotics discontinued on 9/18  Transaminitis -likely related to ischemic hepatitis -12/23/17 RUQ US--GB wall thickening with cholelithiasis, possible GB neck stone -12/24/17 CT abd--GB wall thickening with surrounding fluid--?cholecystitis vs ascites/anasarca -discussed with general surgery and unlikely this is related to her gall bladder -GI consult appreciated -viral hepatitis panel unrevealing -overall transaminases improving  AKI -due to sepsis and hemodynamic changes -renal US--no hydronephrosis -Baseline creatinine 0.6-0.8 -expecting serum creatinine to reach plateau 12/26/17 -was receiving bicarbonate infusion for metabolic acidosis, which was stopped on 9/17 - creatinine has been trending down -continue to monitor.  Coagulopathy -due to rivaroxaban and liver injury -vitamin K x 1 on9/16/19 -daily INR is trending down  Elevated troponin -no chest pain -personally reviewed EKG--sinus, nonspecific T wave changes -due to demand ischemia  Acute on chronic combined CHF.  -patient continues to have evidence of volume overload -chest xray showed persistent CHF -she did receive IV hydration through hospitalization -continue on IV lasix -monitor intake and output -Echo shows EF of 45% with grade 2 DD  Cardiomyopathy -suspect related to Etoh, stress -12/24/17 Echo--EF 45%, diffuse HK, G2DD, PASP 33 -will ultimately need cardiology follow up  Pyuria -urine culture neg  Persistent atrial fibrillation -Holding atenolol secondary to hypotension -Continue flecainide -resume xarelto since INR has trended down  Hyperglycemia -Hemoglobin A1c--5.4  Interstitial Lung Disease -stable on RA  Etoh  Dependence -12/24/17 pt revealed she drinks 6 pack of beer on sat and sun -CIWA -no  signs of withdrawal at present  Hyponatremia -due to AKI and defect in Na reabsorption -resolved    Disposition Plan: transfer to medical floor Family Communication:NoFamily at bedside  Consultants:GI  Code Status: FULL  DVT Prophylaxis:xarelto   Procedures: As Listed in Progress Note Above  Antibiotics: vanco 9/14>>>9/16 Zosyn 9/14>>>9/18  Subjective: Feels breathing is improving. Still has some wheezing, but this is better. Still has some loose stools when passing urine.  Objective: Vitals:   12/27/17 0831 12/27/17 0900 12/27/17 1000 12/27/17 1100  BP:   (!) 157/84   Pulse:  97 78 84  Resp:  (!) '27 14 15  '$ Temp:      TempSrc:      SpO2: 100%  99%   Weight:      Height:        Intake/Output Summary (Last 24 hours) at 12/27/2017 1127 Last data filed at 12/27/2017 0056 Gross per 24 hour  Intake 286.47 ml  Output -  Net 286.47 ml   Weight change: -1.2 kg Exam:  General exam: Alert, awake, oriented x 3 Respiratory system: mild crackles at bases. Wheezing is better today. Respiratory effort normal. Cardiovascular system:RRR. No murmurs, rubs, gallops. Gastrointestinal system: Abdomen is nondistended, soft and nontender. No organomegaly or masses felt. Normal bowel sounds heard. Central nervous system: Alert and oriented. No focal neurological deficits. Extremities: 1+ edema bilaterally Skin: No rashes, lesions or ulcers Psychiatry: Judgement and insight appear normal. Mood & affect appropriate.    Data Reviewed: I have personally reviewed following labs and imaging studies Basic Metabolic Panel: Recent Labs  Lab 12/24/17 0346 12/24/17 1349 12/25/17 0410 12/26/17 0412 12/27/17 0406  NA 131* 132* 135 137 137  K 5.1 4.2 3.5 2.4* 3.5  CL 104 102 103 98 98  CO2 15* 16* '22 25 28  '$ GLUCOSE 112* 103* 117* 100* 205*  BUN '16 19 23 22 20  '$ CREATININE 1.86*  2.03* 2.27* 2.07* 1.69*  CALCIUM 7.5* 7.5* 7.4* 7.8* 8.3*  MG  --   --   --  1.4* 2.2   Liver Function Tests: Recent Labs  Lab 12/24/17 0346 12/24/17 0725 12/25/17 0410 12/26/17 0412 12/27/17 0406  AST 3,441* 5,226* 4,785* 1,724* 715*  ALT 1,547* 2,200* 2,826* 2,039* 1,497*  ALKPHOS 55 54 53 60 98  BILITOT 2.7* 2.8* 1.4* 1.3* 1.5*  PROT 5.2* 5.2* 4.9* 5.2* 5.7*  ALBUMIN 2.8* 2.8* 2.6* 2.7* 2.9*   Recent Labs  Lab 12/23/17 0901  LIPASE 26   Recent Labs  Lab 12/23/17 2255  AMMONIA 20   Coagulation Profile: Recent Labs  Lab 12/24/17 0836 12/25/17 0846 12/26/17 0412 12/27/17 0406  INR 6.65* 3.44 1.87 1.43   CBC: Recent Labs  Lab 12/22/17 1745 12/23/17 0405 12/23/17 2008 12/24/17 0126 12/25/17 0410 12/26/17 0412  WBC 10.2 8.2 10.7* 12.4* 11.3* 9.6  NEUTROABS 7.6  --  7.6 9.8*  --   --   HGB 14.5 12.9 12.9 13.3 12.4 12.3  HCT 43.9 40.6 40.6 41.0 35.9* 36.8  MCV 94.2 93.8 95.8 94.9 91.8 92.2  PLT 223 205 191 188 155 162   Cardiac Enzymes: Recent Labs  Lab 12/23/17 0405 12/23/17 0901 12/23/17 2008 12/24/17 0126 12/24/17 0725  CKTOTAL  --  22*  --   --   --   TROPONINI <0.03 0.03* 0.03* 0.03* 0.03*   BNP: Invalid input(s): POCBNP CBG: Recent Labs  Lab 12/23/17 1901 12/25/17 0409  GLUCAP 126* 109*   HbA1C: No results for input(s): HGBA1C in the last  72 hours. Urine analysis:    Component Value Date/Time   COLORURINE AMBER (A) 12/22/2017 1838   APPEARANCEUR CLOUDY (A) 12/22/2017 1838   LABSPEC 1.017 12/22/2017 1838   PHURINE 5.0 12/22/2017 1838   GLUCOSEU 50 (A) 12/22/2017 1838   HGBUR NEGATIVE 12/22/2017 1838   BILIRUBINUR NEGATIVE 12/22/2017 1838   KETONESUR NEGATIVE 12/22/2017 1838   PROTEINUR >=300 (A) 12/22/2017 1838   UROBILINOGEN 1.0 02/04/2015 1533   NITRITE NEGATIVE 12/22/2017 1838   LEUKOCYTESUR NEGATIVE 12/22/2017 1838   Sepsis Labs: '@LABRCNTIP'$ (procalcitonin:4,lacticidven:4) ) Recent Results (from the past 240 hour(s))   Blood culture (routine x 2)     Status: None   Collection Time: 12/22/17  6:45 PM  Result Value Ref Range Status   Specimen Description RIGHT ANTECUBITAL  Final   Special Requests   Final    BOTTLES DRAWN AEROBIC AND ANAEROBIC Blood Culture adequate volume   Culture   Final    NO GROWTH 5 DAYS Performed at [facility], [address], [place], [place] [address]    Report Status 12/27/2017 FINAL  Final  Blood culture (routine x 2)     Status: None   Collection Time: 12/22/17  7:30 PM  Result Value Ref Range Status   Specimen Description RIGHT ANTECUBITAL  Final   Special Requests   Final    BOTTLES DRAWN AEROBIC ONLY Blood Culture adequate volume   Culture   Final    NO GROWTH 5 DAYS Performed at [facility], [address], [place], [place] [address]    Report Status 12/27/2017 FINAL  Final  Urine culture     Status: None   Collection Time: 12/22/17  9:31 PM  Result Value Ref Range Status   Specimen Description   Final    URINE, RANDOM Performed at [facility], [address], [place], [place] [address]    Special Requests   Final    NONE Performed at [facility], [address], [place], [place] [address]    Culture   Final    NO GROWTH Performed at [facility], [place] [address], [place], [place] [address]    Report Status 12/24/2017 FINAL  Final  MRSA PCR Screening     Status: None   Collection Time: 12/22/17 11:18 PM  Result Value Ref Range Status   MRSA by PCR NEGATIVE NEGATIVE Final    Comment:        The GeneXpert MRSA Assay (FDA approved for NASAL specimens only), is one component of a comprehensive MRSA colonization surveillance program. It is not intended to diagnose MRSA infection nor to guide or monitor treatment for MRSA infections. Performed at [facility], [address] [place], [place] [address]   Culture, blood (x 2)     Status: None (Preliminary result)   Collection Time: 12/23/17 10:57 PM  Result Value Ref Range Status   Specimen  Description BLOOD RIGHT HAND  Final   Special Requests   Final    BOTTLES DRAWN AEROBIC ONLY Blood Culture adequate volume   Culture   Final    NO GROWTH 4 DAYS Performed at [facility], [address] [place], [place] [address]    Report Status PENDING  Incomplete  Culture, blood (x 2)     Status: None (Preliminary result)   Collection Time: 12/23/17 10:57 PM  Result Value Ref Range Status   Specimen Description RIGHT ANTECUBITAL  Final   Special Requests   Final    BOTTLES DRAWN AEROBIC AND ANAEROBIC  Blood Culture results may not be optimal due to an inadequate volume of blood received in culture bottles   Culture   Final    NO GROWTH 4 DAYS Performed at [facility], [address], [place], [place] [address]    Report Status PENDING  Incomplete     Scheduled Meds: . chlorhexidine  15 mL Mouth Rinse BID  . flecainide  50 mg Oral Q12H  . furosemide  20 mg Intravenous BID  . ipratropium-albuterol  3 mL Nebulization TID  . loperamide  4 mg Oral Once  . mouth rinse  15 mL Mouth Rinse q12n4p  . methylPREDNISolone (SOLU-MEDROL) injection  60 mg Intravenous Q12H  . metoCLOPramide (REGLAN) injection  5 mg Intravenous Q8H  . pantoprazole  40 mg Oral q morning - 10a  . rivaroxaban  20 mg Oral Daily   Continuous Infusions: . sodium chloride Stopped (12/26/17 1948)    Procedures/Studies: Ct Abdomen Pelvis Wo Contrast  Result Date: 12/24/2017 CLINICAL DATA:  Cirrhosis fatty liver nausea vomiting and loose stools EXAM: CT ABDOMEN AND PELVIS WITHOUT CONTRAST TECHNIQUE: Multidetector CT imaging of the abdomen and pelvis was performed following the standard protocol without IV contrast. COMPARISON:  Ultrasound 12/23/2017, 03/09/2015 CT FINDINGS: Lower chest: Lung bases demonstrate small bilateral pleural effusions. Bibasilar atelectasis. Septal thickening suggesting mild edema. Postsurgical changes of the left breast. Cardiomegaly. Hepatobiliary: No focal hepatic abnormality. No biliary  dilatation. Slight increased intraluminal density at the gallbladder. Wall thickening and surrounding fluid. Pancreas: Unremarkable. No pancreatic ductal dilatation or surrounding inflammatory changes. Spleen: Normal in size without focal abnormality. Adrenals/Urinary Tract: Adrenal glands are normal. No hydronephrosis. Perinephric fat stranding. Bladder contains a Foley catheter Stomach/Bowel: Stomach nonenlarged. No dilated small bowel. No colon wall thickening. Appendix not clearly identified. Vascular/Lymphatic: Moderate aortic atherosclerosis without aneurysm. No significantly enlarged lymph nodes. Reproductive: Uterus and bilateral adnexa are unremarkable. Other: No free air. Small amount of ascites within the abdomen and pelvis. Diffuse body wall edema consistent with anasarca. Musculoskeletal: Scoliosis of the spine. Treated compression deformity at T11. Partial fusion L1-L2. Degenerative changes of the lumbar spine. Left hip replacement. IMPRESSION: 1. Negative for bowel obstruction or bowel wall thickening. 2. Gallbladder wall thickening with surrounding edema and or fluid, corresponding to thickened wall noted on sonography. Findings are nonspecific and could be seen in the setting of liver disease, acute or chronic cholecystitis, and edema forming states. If acute gallbladder disease remains a concern, further evaluation with hepatobiliary nuclear medicine imaging could be obtained. 3. Anasarca with small amount of ascites in the abdomen and pelvis. 4. Small pleural effusions and bibasilar atelectasis. Septal thickening at the bases suggesting edema. Electronically Signed   By: Donavan Foil M.D.   On: 12/24/2017 00:22   Dg Chest 2 View  Result Date: 12/22/2017 CLINICAL DATA:  77 y/o  F; chest pain with intermittent nausea. EXAM: CHEST - 2 VIEW COMPARISON:  03/28/2016 CT chest.  11/16/2014 chest radiograph. FINDINGS: Mild cardiomegaly. Calcific aortic atherosclerosis. Coarse reticular opacities  greatest at the lung bases compatible pulmonary fibrosis. No consolidation, effusion, or pneumothorax. T12 chronic compression deformity. Bones are otherwise unremarkable. IMPRESSION: 1. Mild cardiomegaly. 2. Basilar predominant pulmonary fibrosis.  No focal consolidation. 3.  Aortic Atherosclerosis (ICD10-I70.0). 4. Chronic T12 compression deformity. Electronically Signed   By: Kristine Garbe M.D.   On: 12/22/2017 19:34   Ct Head Wo Contrast  Result Date: 12/23/2017 CLINICAL DATA:  Acute confusion. EXAM: CT HEAD WITHOUT CONTRAST TECHNIQUE: Contiguous axial images were obtained from the  base of the skull through the vertex without intravenous contrast. COMPARISON:  None. FINDINGS: Brain: Generalized atrophy. No sign of acute infarction. 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 Chronic small-vessel changes of the deep white matter. No mass lesion, hemorrhage, hydrocephalus or extra-axial collection. Vascular: There is atherosclerotic calcification of the major vessels at the base of the brain. Skull: Negative Sinuses/Orbits: Clear/normal Other: None IMPRESSION: No acute finding by CT. Atrophy and chronic small-vessel ischemic change. 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 Electronically Signed   By: Nelson Chimes M.D.   On: 12/23/2017 19:59   US Renal  Result Date: 12/24/2017 CLINICAL DATA:  Acute renal insufficiency EXAM: RENAL ULTRASOUND COMPARISON:  CT abdomen and pelvis December 23, 2017 FINDINGS: Right Kidney: Length: 10.9 cm. Echogenicity and renal cortical thickness are within normal limits. No mass, perinephric fluid, or hydronephrosis visualized. No sonographically demonstrable calculus or ureterectasis. Left Kidney: Length: 10.7 cm. Echogenicity and renal cortical thickness are within normal limits. No perinephric fluid or hydronephrosis visualized. There is a cyst arising from the lower pole left kidney measuring 1.7 x 1.7 x 1.0 cm. No  sonographically demonstrable calculus or ureterectasis. Bladder: Decompressed with Foley catheter and cannot be assessed. There is ascites.  There is a left pleural effusion. IMPRESSION: Small left renal cyst. Kidneys otherwise appear unremarkable bilaterally. Urinary bladder is decompressed and cannot be assessed at this time. There is ascites as well as a left pleural effusion evident. Electronically Signed   By: Lowella Grip III M.D.   On: 12/24/2017 10:41   Dg Chest Port 1 View  Result Date: 12/26/2017 CLINICAL DATA:  Wheezing and cough and pleuritic abdominal pain. History of atrial fibrillation, interstitial lung disease, breast malignancy. EXAM: PORTABLE CHEST 1 VIEW COMPARISON:  Portable chest x-ray of December 23, 2017 FINDINGS: The lungs are mildly hypoinflated. The cardiac silhouette is enlarged. The pulmonary vascularity is engorged. There small bilateral pleural effusions. There is calcification in the wall of the aortic arch. The bony thorax exhibits no acute abnormality. IMPRESSION: CHF with mild interstitial edema and small bilateral pleural effusions. The appearance of the chest has deteriorated slightly since the study of 3 days ago. Thoracic aortic atherosclerosis. Electronically Signed   By: David  Martinique M.D.   On: 12/26/2017 15:43   Dg Chest Port 1 View  Result Date: 12/23/2017 CLINICAL DATA:  Dyspnea and confusion EXAM: PORTABLE CHEST 1 VIEW COMPARISON:  12/22/2017, 02/04/2015, CT chest 03/28/2016 FINDINGS: Cardiomegaly with vascular congestion, small pleural effusion and interstitial edema. Confluent opacity at the left base. Enlarged central pulmonary vessels. Aortic atherosclerosis. No pneumothorax. IMPRESSION: 1. Cardiomegaly with vascular congestion, small pleural effusion and mild increased interstitial opacity consistent with edema 2. Patchy consolidation at the left base may reflect atelectasis or superimposed pneumonia Electronically Signed   By: Donavan Foil M.D.   On:  12/23/2017 19:32   US Abdomen Limited Ruq  Result Date: 12/23/2017 CLINICAL DATA:  Nausea and vomiting for 1 day EXAM: ULTRASOUND ABDOMEN LIMITED RIGHT UPPER QUADRANT COMPARISON:  Abdominal CT 03/09/2015 FINDINGS: Gallbladder: Shadowing gallstone in the neck. There is generalized gallbladder wall thickening to 10 mm (this measurement includes pericholecystic fat) without focal tenderness or wall striation. Gallbladder is incompletely distended. Common bile duct: Diameter: 4 mm Liver: No focal lesion identified. Within normal limits in parenchymal echogenicity. Portal vein is patent on color Doppler imaging with normal direction of blood flow towards the liver. Trace right pleural effusion IMPRESSION: Cholelithiasis. There is gallbladder wall thickening but this may  be related to under distention of the gallbladder. No focal tenderness as would be expected for acute cholecystitis. Electronically Signed   By: Monte Fantasia M.D.   On: 12/23/2017 11:09    Kathie Dike, MD  [facility] Pager [phone]  If 7PM-7AM, please contact night-coverage [email or web address] Password [email or web address] 12/27/2017, 11:27 AM   LOS: 5 days

## 2017-12-27 NOTE — Care Management Note (Signed)
Case Management Note  Patient Details  Name: Karina Tran MRN: [id number] Date of Birth: November 01, 1940  Subjective/Objective:      Sepsis, transaminitis. From home with daughter.   Independent pta.         Action/Plan: Requested PT eval when appropriate.   Expected Discharge Date:  12/25/17               Expected Discharge Plan:     In-House Referral:     Discharge planning Services  CM Consult  Post Acute Care Choice:    Choice offered to:     DME Arranged:    DME Agency:     HH Arranged:    HH Agency:     Status of Service:  In process, will continue to follow  If discussed at Long Length of Stay Meetings, dates discussed:    Additional Comments:  Rasa Degrazia, Chauncey Reading, RN 12/27/2017, 10:48 AM

## 2017-12-27 NOTE — Plan of Care (Signed)
  Problem: Acute Rehab PT Goals(only PT should resolve) Goal: Patient Will Transfer Sit To/From Stand Outcome: Progressing Flowsheets (Taken 12/27/2017 1504) Patient will transfer sit to/from stand: Independently Goal: Pt Will Transfer Bed To Chair/Chair To Bed Outcome: Progressing Flowsheets (Taken 12/27/2017 1504) Pt will Transfer Bed to Chair/Chair to Bed: Independently Goal: Pt Will Ambulate Outcome: Progressing Flowsheets (Taken 12/27/2017 1504) Pt will Ambulate: with modified independence; > 125 feet   3:04 PM, 12/27/17 Lonell Grandchild, MPT Physical Therapist with [facility] [phone] office [phone] mobile phone w

## 2017-12-27 NOTE — Evaluation (Signed)
Physical Therapy Evaluation Patient Details Name: Karina Tran MRN: [id number] DOB: [birth year] Today's Date: 12/27/2017   History of Present Illness  Karina Tran is a 77 y.o. female with medical history significant for atrial flutter/fibrillation on Xarelto, ILD, hypertension, prior pancreatitis, and thoracic compression fracture who presented to the emergency department with sudden onset weakness as well as nausea, vomiting, diaphoresis as well as some presyncope that began at approximately 2 PM this afternoon as she was going to go used to bathroom.  She denies any chest pain, palpitations, shortness of breath, fever, chills, diarrhea, or abdominal pain.  She had her daughter called EMS and upon arrival to the ED also developed an ache to her right shoulder.  She states that she has been taking her medications as prescribed and has not had any other symptoms.  She denies any particular aggravating or alleviating factors.    Clinical Impression  Patient functioning near baseline for functional mobility and gait, able to ambulate in room and hallway without loss of balance, mostly limited due to c/o mild SOB and fatigue and tolerated sitting up at bedside after therapy.  Patient will benefit from continued physical therapy in hospital to increase strength, balance, endurance for safe ADLs and gait.    Follow Up Recommendations No PT follow up    Equipment Recommendations  None recommended by PT    Recommendations for Other Services       Precautions / Restrictions Precautions Precautions: None Restrictions Weight Bearing Restrictions: No      Mobility  Bed Mobility Overal bed mobility: Modified Independent                Transfers Overall transfer level: Modified independent Equipment used: None                Ambulation/Gait Ambulation/Gait assistance: Supervision Gait Distance (Feet): 50 Feet Assistive device: None Gait Pattern/deviations:  WFL(Within Functional Limits) Gait velocity: decreased   General Gait Details: grossly WFL except once fatigued and SOB had to lean on siderail to turnaround   Stairs            Wheelchair Mobility    Modified Rankin (Stroke Patients Only)       Balance Overall balance assessment: No apparent balance deficits (not formally assessed)                                           Pertinent Vitals/Pain Pain Assessment: No/denies pain    Home Living Family/patient expects to be discharged to:: Private residence Living Arrangements: Children(daughter and son-n-law) Available Help at Discharge: Family Type of Home: House Home Access: Stairs to enter Entrance Stairs-Rails: Right;Left;Can reach both Entrance Stairs-Number of Steps: 4 Home Layout: Laundry or work area in basement;Two level Home Equipment: Walker - 2 wheels;Cane - single point;Bedside commode;Shower seat - built in;Hand held shower head;Grab bars - tub/shower      Prior Function Level of Independence: Independent         Comments: community ambulator, drives     Journalist, newspaper        Extremity/Trunk Assessment   Upper Extremity Assessment Upper Extremity Assessment: Overall WFL for tasks assessed    Lower Extremity Assessment Lower Extremity Assessment: Generalized weakness    Cervical / Trunk Assessment Cervical / Trunk Assessment: Normal  Communication   Communication: No difficulties  Cognition Arousal/Alertness: Awake/alert Behavior During Therapy: [facility]  for tasks assessed/performed Overall Cognitive Status: Within Functional Limits for tasks assessed                                        General Comments      Exercises     Assessment/Plan    PT Assessment Patient needs continued PT services  PT Problem List Decreased strength;Decreased activity tolerance;Decreased balance;Decreased mobility       PT Treatment Interventions Gait training;Stair  training;Functional mobility training;Therapeutic activities;Therapeutic exercise;Patient/family education    PT Goals (Current goals can be found in the Care Plan section)  Acute Rehab PT Goals Patient Stated Goal: return home PT Goal Formulation: With patient Time For Goal Achievement: 01/03/18 Potential to Achieve Goals: Good    Frequency Min 3X/week   Barriers to discharge        Co-evaluation               AM-PAC PT "6 Clicks" Daily Activity  Outcome Measure Difficulty turning over in bed (including adjusting bedclothes, sheets and blankets)?: None Difficulty moving from lying on back to sitting on the side of the bed? : None Difficulty sitting down on and standing up from a chair with arms (e.g., wheelchair, bedside commode, etc,.)?: None Help needed moving to and from a bed to chair (including a wheelchair)?: None Help needed walking in hospital room?: A Little Help needed climbing 3-5 steps with a railing? : A Little 6 Click Score: 22    End of Session   Activity Tolerance: Patient tolerated treatment well;Patient limited by fatigue(limited by mild SOB) Patient left: in bed;Other (comment)(RT in room to give breathing treatment) Nurse Communication: Mobility status PT Visit Diagnosis: Unsteadiness on feet (R26.81);Other abnormalities of gait and mobility (R26.89);Muscle weakness (generalized) (M62.81)    Time: 1572-6203 PT Time Calculation (min) (ACUTE ONLY): 28 min   Charges:   PT Evaluation $PT Eval Moderate Complexity: 1 Mod PT Treatments $Therapeutic Activity: 23-37 mins        3:02 PM, 12/27/17 Lonell Grandchild, MPT Physical Therapist with [facility] [phone] office [phone] mobile phone

## 2017-12-28 LAB — COMPREHENSIVE METABOLIC PANEL
ALK PHOS: 84 U/L (ref 38–126)
ALT: 1029 U/L — ABNORMAL HIGH (ref 0–44)
ANION GAP: 11 (ref 5–15)
AST: 265 U/L — AB (ref 15–41)
Albumin: 2.9 g/dL — ABNORMAL LOW (ref 3.5–5.0)
BILIRUBIN TOTAL: 1.6 mg/dL — AB (ref 0.3–1.2)
BUN: 28 mg/dL — AB (ref 8–23)
CALCIUM: 8.6 mg/dL — AB (ref 8.9–10.3)
CO2: 28 mmol/L (ref 22–32)
Chloride: 96 mmol/L — ABNORMAL LOW (ref 98–111)
Creatinine, Ser: 1.42 mg/dL — ABNORMAL HIGH (ref 0.44–1.00)
GFR calc Af Amer: 40 mL/min — ABNORMAL LOW (ref >60)
GFR, EST NON AFRICAN AMERICAN: 34 mL/min — AB (ref >60)
GLUCOSE: 166 mg/dL — AB (ref 70–99)
Potassium: 3.3 mmol/L — ABNORMAL LOW (ref 3.5–5.1)
Sodium: 135 mmol/L (ref 135–145)
TOTAL PROTEIN: 5.7 g/dL — AB (ref 6.5–8.1)

## 2017-12-28 LAB — CULTURE, BLOOD (ROUTINE X 2)
CULTURE: NO GROWTH
Culture: NO GROWTH
Special Requests: ADEQUATE

## 2017-12-28 LAB — C DIFFICILE QUICK SCREEN W PCR REFLEX
C DIFFICLE (CDIFF) ANTIGEN: NEGATIVE
C Diff interpretation: NOT DETECTED
C Diff toxin: NEGATIVE

## 2017-12-28 LAB — PROTIME-INR
INR: 2.51
Prothrombin Time: 26.9 seconds — ABNORMAL HIGH (ref 11.4–15.2)

## 2017-12-28 MED ORDER — POTASSIUM CHLORIDE CRYS ER 20 MEQ PO TBCR
40.0000 meq | EXTENDED_RELEASE_TABLET | Freq: Once | ORAL | Status: AC
  Administered 2017-12-28: 40 meq via ORAL
  Filled 2017-12-28: qty 2

## 2017-12-28 MED ORDER — LOPERAMIDE HCL 2 MG PO CAPS
2.0000 mg | ORAL_CAPSULE | Freq: Four times a day (QID) | ORAL | Status: DC | PRN
  Administered 2017-12-28 – 2017-12-29 (×3): 2 mg via ORAL
  Filled 2017-12-28 (×3): qty 1

## 2017-12-28 MED ORDER — FUROSEMIDE 10 MG/ML IJ SOLN
40.0000 mg | Freq: Two times a day (BID) | INTRAMUSCULAR | Status: DC
  Administered 2017-12-28 – 2017-12-29 (×2): 40 mg via INTRAVENOUS
  Filled 2017-12-28 (×2): qty 4

## 2017-12-28 MED ORDER — ATENOLOL 25 MG PO TABS
50.0000 mg | ORAL_TABLET | Freq: Every day | ORAL | Status: DC
  Administered 2017-12-28 – 2018-01-01 (×5): 50 mg via ORAL
  Filled 2017-12-28 (×5): qty 2

## 2017-12-28 NOTE — Care Management Important Message (Signed)
Important Message  Patient Details  Name: Karina Tran MRN: [id number] Date of Birth: 1940-08-24   Medicare Important Message Given:  Yes    [name] 12/28/2017, 10:36 AM

## 2017-12-28 NOTE — Progress Notes (Signed)
PT Cancellation Note  Patient Details Name: Karina Tran MRN: [id number] DOB: Jun 26, 1940   Cancelled Treatment:    Reason Eval/Treat Not Completed: Other (comment).  Declined therapy after PT returned the second time due to her perception that her edema in ankles would not be good to walk with in her room.  States she is being tested for c-diff and does not want to move for that reason as well.  Declined walk in her room.  Try again next week.   Ramond Dial 12/28/2017, 5:36 PM   Mee Hives, PT MS Acute Rehab Dept. Number: [place]

## 2017-12-28 NOTE — Progress Notes (Signed)
         PROGRESS NOTE  Karina Tran MRN:[id number] DOB: 02/04/1941 DOA: 12/22/2017 PCP: Corrington, Kip A, MD  Brief History: 77-year-old female with a history of persistent atrial fibrillation, hypertension, interstitial lung disease presenting with acute onset of nausea, vomiting, generalized weakness, diaphoresis that began around 2 PM on 12/22/2017. The patient had been in her usual state of health up until 2 PM on 12/22/2017. The patient decided to go take a nap after brunch on 12/22/2017. She woke up to go to the bathroom. At that time, the patient felt lightheaded and dizzy. She subsequently had a loose stool without any hematochezia or melena. When she went back to her bedroom, the patient had generalized weakness with diaphoresis, but she denied any chest discomfort, shortness of breath, visual disturbance, focal extremity weakness. As result, EMS was activated. The patient denied any recent travels, exotic or undercooked foods, or sick contacts. She lives with her daughter and son-in-law and her grandchildren all of whom are in good health. She denies any fevers, chills, abdominal pain, dysuria, hematuria, hematochezia, melena, rashes, synovitis. She has not had any recent travels. She has not had any recent changes in any of her medications. The patient had numerous episodes of dry heaves on 12/22/2017 which continued in the emergency department. In the emergency department, the patient was noted to be afebrile but hypotensive with blood pressure of 77/58. She was saturating 100% on room air. She was noted to have elevated LFTs with AST 97, ALT 51, alk phosphatase 85, total bilirubin 1.1 at the time of admission. WBC was 10.2. Lactic acid peaked at 3.9. She was started on vancomycin and Zosyn after blood cultures and urine cultures were obtained. Chest x-ray showed pulmonary fibrosis without any consolidations.   Assessment/Plan: Sepsis -general surgery consulted for  concerns for cholecystitis--discussed with Dr. Mark Jenkins, unlikely to be related to her gall bladder -blood cultures remain neg -PCT <0.10 -lactic acid peaked 4.2, but has since improved -check cortisol--28.8 -no convincing evidence of infectious process. Antibiotics discontinued on 9/18  Transaminitis -likely related to ischemic hepatitis -12/23/17 RUQ US--GB wall thickening with cholelithiasis, possible GB neck stone -12/24/17 CT abd--GB wall thickening with surrounding fluid--?cholecystitis vs ascites/anasarca -discussed with general surgery and unlikely this is related to her gall bladder -GI consult appreciated -viral hepatitis panel unrevealing -overall transaminases improving  AKI -due to sepsis and hemodynamic changes -renal US--no hydronephrosis -Baseline creatinine 0.6-0.8 -expecting serum creatinine to reach plateau 12/26/17 -was receiving bicarbonate infusion for metabolic acidosis, which was stopped on 9/17 - creatinine has been trending down -continue to monitor.  Coagulopathy -due to rivaroxaban and liver injury -vitamin K x 1 on9/16/19 -daily INR is trending down  Elevated troponin -no chest pain -personally reviewed EKG--sinus, nonspecific T wave changes -due to demand ischemia  Acute on chronic combined CHF.  -patient continues to have evidence of volume overload -chest xray showed persistent CHF -she did receive IV hydration through hospitalization -continue on IV lasix -monitor intake and output -Echo shows EF of 45% with grade 2 DD  Cardiomyopathy -suspect related to Etoh, stress -12/24/17 Echo--EF 45%, diffuse HK, G2DD, PASP 33 -will ultimately need cardiology follow up  Pyuria -urine culture neg  Persistent atrial fibrillation -Resume atenolol since blood pressure has improved. -Continue flecainide -Anticoagulated with Xarelto  Hyperglycemia -Hemoglobin A1c--5.4  Interstitial Lung Disease -stable on RA  Etoh  Dependence -12/24/17 pt revealed she drinks 6 pack of beer on sat and sun -CIWA -no signs of   withdrawal at present  Hyponatremia -due to AKI and defect in Na reabsorption -resolved  Diarrhea -C. difficile negative. -Treat supportively with Imodium as needed.    Disposition Plan: Discharge home once bowel movements have improved and overall volume status has improved  Family Communication:Discussed with daughter at the bedside  Consultants:GI  Code Status: FULL  DVT Prophylaxis:xarelto   Procedures: As Listed in Progress Note Above  Antibiotics: vanco 9/14>>>9/16 Zosyn 9/14>>>9/18  Subjective: Reports continued loose stools.  Has approximately 6 loose bowel movements today.  Still has some wheezing and shortness of breath.  Objective: Vitals:   12/28/17 0509 12/28/17 0729 12/28/17 1343 12/28/17 1640  BP: (!) 151/80   (!) 167/80  Pulse: 85   82  Resp: 18   18  Temp: (!) 97.5 F (36.4 C)   98.1 F (36.7 C)  TempSrc: Oral     SpO2: 94% 97% 98% 95%  Weight:      Height:        Intake/Output Summary (Last 24 hours) at 12/28/2017 1706 Last data filed at 12/28/2017 1400 Gross per 24 hour  Intake 720 ml  Output 1850 ml  Net -1130 ml   Weight change:  Exam:  General exam: Alert, awake, oriented x 3 Respiratory system: Bibasilar crackles. Respiratory effort normal. Cardiovascular system:RRR. No murmurs, rubs, gallops. Gastrointestinal system: Abdomen is nondistended, soft and nontender. No organomegaly or masses felt. Normal bowel sounds heard. Central nervous system: Alert and oriented. No focal neurological deficits. Extremities: 1+ edema bilaterally Skin: No rashes, lesions or ulcers Psychiatry: Judgement and insight appear normal. Mood & affect appropriate.    Data Reviewed: I have personally reviewed following labs and imaging studies Basic Metabolic Panel: Recent Labs  Lab 12/24/17 1349 12/25/17 0410 12/26/17 0412 12/27/17 0406  12/28/17 0507  NA 132* 135 137 137 135  K 4.2 3.5 2.4* 3.5 3.3*  CL 102 103 98 98 96*  CO2 16* _0 GLUCOSE 103* 117* 100* 205* 166*  BUN _1 28*  CREATININE 2.03* 2.27* 2.07* 1.69* 1.42*  CALCIUM 7.5* 7.4* 7.8* 8.3* 8.6*  MG  --   --  1.4* 2.2  --    Liver Function Tests: Recent Labs  Lab 12/24/17 0725 12/25/17 0410 12/26/17 0412 12/27/17 0406 12/28/17 0507  AST 5,226* 4,785* 1,724* 715* 265*  ALT 2,200* 2,826* 2,039* 1,497* 1,029*  ALKPHOS 54 53 60 98 84  BILITOT 2.8* 1.4* 1.3* 1.5* 1.6*  PROT 5.2* 4.9* 5.2* 5.7* 5.7*  ALBUMIN 2.8* 2.6* 2.7* 2.9* 2.9*   Recent Labs  Lab 12/23/17 0901  LIPASE 26   Recent Labs  Lab 12/23/17 2255  AMMONIA 20   Coagulation Profile: Recent Labs  Lab 12/24/17 0836 12/25/17 0846 12/26/17 0412 12/27/17 0406 12/28/17 0507  INR 6.65* 3.44 1.87 1.43 2.51   CBC: Recent Labs  Lab 12/22/17 1745 12/23/17 0405 12/23/17 2008 12/24/17 0126 12/25/17 0410 12/26/17 0412  WBC 10.2 8.2 10.7* 12.4* 11.3* 9.6  NEUTROABS 7.6  --  7.6 9.8*  --   --   HGB 14.5 12.9 12.9 13.3 12.4 12.3  HCT 43.9 40.6 40.6 41.0 35.9* 36.8  MCV 94.2 93.8 95.8 94.9 91.8 92.2  PLT 223 205 191 188 155 162   Cardiac Enzymes: Recent Labs  Lab 12/23/17 0405 12/23/17 0901 12/23/17 2008 12/24/17 0126 12/24/17 0725  CKTOTAL  --  22*  --   --   --   TROPONINI <0.03 0.03* 0.03* 0.03* 0.03*   BNP:  Invalid input(s): POCBNP CBG: Recent Labs  Lab 12/23/17 1901 12/25/17 0409  GLUCAP 126* 109*   HbA1C: No results for input(s): HGBA1C in the last 72 hours. Urine analysis:    Component Value Date/Time   COLORURINE AMBER (A) 12/22/2017 1838   APPEARANCEUR CLOUDY (A) 12/22/2017 1838   LABSPEC 1.017 12/22/2017 1838   PHURINE 5.0 12/22/2017 1838   GLUCOSEU 50 (A) 12/22/2017 1838   HGBUR NEGATIVE 12/22/2017 1838   BILIRUBINUR NEGATIVE 12/22/2017 1838   KETONESUR NEGATIVE 12/22/2017 1838   PROTEINUR >=300 (A) 12/22/2017 1838   UROBILINOGEN  1.0 02/04/2015 1533   NITRITE NEGATIVE 12/22/2017 1838   LEUKOCYTESUR NEGATIVE 12/22/2017 1838   Sepsis Labs: @LABRCNTIP(procalcitonin:4,lacticidven:4) ) Recent Results (from the past 240 hour(s))  Blood culture (routine x 2)     Status: None   Collection Time: 12/22/17  6:45 PM  Result Value Ref Range Status   Specimen Description RIGHT ANTECUBITAL  Final   Special Requests   Final    BOTTLES DRAWN AEROBIC AND ANAEROBIC Blood Culture adequate volume   Culture   Final    NO GROWTH 5 DAYS Performed at [facility], [address] [place], [place] [address]    Report Status 12/27/2017 FINAL  Final  Blood culture (routine x 2)     Status: None   Collection Time: 12/22/17  7:30 PM  Result Value Ref Range Status   Specimen Description RIGHT ANTECUBITAL  Final   Special Requests   Final    BOTTLES DRAWN AEROBIC ONLY Blood Culture adequate volume   Culture   Final    NO GROWTH 5 DAYS Performed at [facility], [address] [place], [place] [address]    Report Status 12/27/2017 FINAL  Final  Urine culture     Status: None   Collection Time: 12/22/17  9:31 PM  Result Value Ref Range Status   Specimen Description   Final    URINE, RANDOM Performed at [facility], [address] [place], [place] [address]    Special Requests   Final    NONE Performed at [facility], [address] [place], [place] [address]    Culture   Final    NO GROWTH Performed at [facility], [address] [place], [place] [address]    Report Status 12/24/2017 FINAL  Final  MRSA PCR Screening     Status: None   Collection Time: 12/22/17 11:18 PM  Result Value Ref Range Status   MRSA by PCR NEGATIVE NEGATIVE Final    Comment:        The GeneXpert MRSA Assay (FDA approved for NASAL specimens only), is one component of a comprehensive MRSA colonization surveillance program. It is not intended to diagnose MRSA infection nor to guide or monitor treatment for MRSA infections. Performed at  [facility], [address] [place], [place] [address]   Culture, blood (x 2)     Status: None   Collection Time: 12/23/17 10:57 PM  Result Value Ref Range Status   Specimen Description BLOOD RIGHT HAND  Final   Special Requests   Final    BOTTLES DRAWN AEROBIC ONLY Blood Culture adequate volume   Culture   Final    NO GROWTH 5 DAYS Performed at  Hospital, [address] , [place] [address]    Report Status 12/28/2017 FINAL  Final  Culture, blood (x 2)     Status: None   Collection Time: 12/23/17 10:57 PM  Result Value Ref Range   Status   Specimen Description RIGHT ANTECUBITAL  Final   Special Requests   Final    BOTTLES DRAWN AEROBIC AND ANAEROBIC Blood Culture results may not be optimal due to an inadequate volume of blood received in culture bottles   Culture   Final    NO GROWTH 5 DAYS Performed at [facility], [address] [place], [place] [address]    Report Status 12/28/2017 FINAL  Final  C difficile quick scan w PCR reflex     Status: None   Collection Time: 12/28/17  2:17 PM  Result Value Ref Range Status   C Diff antigen NEGATIVE NEGATIVE Final   C Diff toxin NEGATIVE NEGATIVE Final   C Diff interpretation No C. difficile detected.  Final    Comment: Performed at [facility], [address] [place], [place] [address]     Scheduled Meds: . flecainide  50 mg Oral Q12H  . furosemide  20 mg Intravenous BID  . ipratropium-albuterol  3 mL Nebulization TID  . mouth rinse  15 mL Mouth Rinse q12n4p  . methylPREDNISolone (SOLU-MEDROL) injection  60 mg Intravenous Q12H  . pantoprazole  40 mg Oral q morning - 10a  . Rivaroxaban  15 mg Oral Daily   Continuous Infusions: . sodium chloride Stopped (12/26/17 1948)    Procedures/Studies: Ct Abdomen Pelvis Wo Contrast  Result Date: 12/24/2017 CLINICAL DATA:  Cirrhosis fatty liver nausea vomiting and loose stools EXAM: CT ABDOMEN AND PELVIS WITHOUT CONTRAST TECHNIQUE: Multidetector CT imaging of the abdomen and  pelvis was performed following the standard protocol without IV contrast. COMPARISON:  Ultrasound 12/23/2017, 03/09/2015 CT FINDINGS: Lower chest: Lung bases demonstrate small bilateral pleural effusions. Bibasilar atelectasis. Septal thickening suggesting mild edema. Postsurgical changes of the left breast. Cardiomegaly. Hepatobiliary: No focal hepatic abnormality. No biliary dilatation. Slight increased intraluminal density at the gallbladder. Wall thickening and surrounding fluid. Pancreas: Unremarkable. No pancreatic ductal dilatation or surrounding inflammatory changes. Spleen: Normal in size without focal abnormality. Adrenals/Urinary Tract: Adrenal glands are normal. No hydronephrosis. Perinephric fat stranding. Bladder contains a Foley catheter Stomach/Bowel: Stomach nonenlarged. No dilated small bowel. No colon wall thickening. Appendix not clearly identified. Vascular/Lymphatic: Moderate aortic atherosclerosis without aneurysm. No significantly enlarged lymph nodes. Reproductive: Uterus and bilateral adnexa are unremarkable. Other: No free air. Small amount of ascites within the abdomen and pelvis. Diffuse body wall edema consistent with anasarca. Musculoskeletal: Scoliosis of the spine. Treated compression deformity at T11. Partial fusion L1-L2. Degenerative changes of the lumbar spine. Left hip replacement. IMPRESSION: 1. Negative for bowel obstruction or bowel wall thickening. 2. Gallbladder wall thickening with surrounding edema and or fluid, corresponding to thickened wall noted on sonography. Findings are nonspecific and could be seen in the setting of liver disease, acute or chronic cholecystitis, and edema forming states. If acute gallbladder disease remains a concern, further evaluation with hepatobiliary nuclear medicine imaging could be obtained. 3. Anasarca with small amount of ascites in the abdomen and pelvis. 4. Small pleural effusions and bibasilar atelectasis. Septal thickening at the  bases suggesting edema. Electronically Signed   By: Kim  Fujinaga M.D.   On: 12/24/2017 00:22   Dg Chest 2 View  Result Date: 12/22/2017 CLINICAL DATA:  77 y/o  F; chest pain with intermittent nausea. EXAM: CHEST - 2 VIEW COMPARISON:  03/28/2016 CT chest.  11/16/2014 chest radiograph. FINDINGS: Mild cardiomegaly. Calcific aortic atherosclerosis. Coarse reticular opacities greatest at the lung bases compatible pulmonary fibrosis. No consolidation, effusion, or pneumothorax. T12 chronic compression deformity. Bones   are otherwise unremarkable. IMPRESSION: 1. Mild cardiomegaly. 2. Basilar predominant pulmonary fibrosis.  No focal consolidation. 3.  Aortic Atherosclerosis (ICD10-I70.0). 4. Chronic T12 compression deformity. Electronically Signed   By: Lance  Furusawa-Stratton M.D.   On: 12/22/2017 19:34   Ct Head Wo Contrast  Result Date: 12/23/2017 CLINICAL DATA:  Acute confusion. EXAM: CT HEAD WITHOUT CONTRAST TECHNIQUE: Contiguous axial images were obtained from the base of the skull through the vertex without intravenous contrast. COMPARISON:  None. FINDINGS: Brain: Generalized atrophy. No sign of acute infarction. 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 Chronic small-vessel changes of the deep white matter. No mass lesion, hemorrhage, hydrocephalus or extra-axial collection. Vascular: There is atherosclerotic calcification of the major vessels at the base of the brain. Skull: Negative Sinuses/Orbits: Clear/normal Other: None IMPRESSION: No acute finding by CT. Atrophy and chronic small-vessel ischemic change. 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 Electronically Signed   By: Mark  Shogry M.D.   On: 12/23/2017 19:59   Us Renal  Result Date: 12/24/2017 CLINICAL DATA:  Acute renal insufficiency EXAM: RENAL ULTRASOUND COMPARISON:  CT abdomen and pelvis December 23, 2017 FINDINGS: Right Kidney: Length: 10.9 cm. Echogenicity and renal cortical thickness are  within normal limits. No mass, perinephric fluid, or hydronephrosis visualized. No sonographically demonstrable calculus or ureterectasis. Left Kidney: Length: 10.7 cm. Echogenicity and renal cortical thickness are within normal limits. No perinephric fluid or hydronephrosis visualized. There is a cyst arising from the lower pole left kidney measuring 1.7 x 1.7 x 1.0 cm. No sonographically demonstrable calculus or ureterectasis. Bladder: Decompressed with Foley catheter and cannot be assessed. There is ascites.  There is a left pleural effusion. IMPRESSION: Small left renal cyst. Kidneys otherwise appear unremarkable bilaterally. Urinary bladder is decompressed and cannot be assessed at this time. There is ascites as well as a left pleural effusion evident. Electronically Signed   By: William  Woodruff III M.D.   On: 12/24/2017 10:41   Dg Chest Port 1 View  Result Date: 12/26/2017 CLINICAL DATA:  Wheezing and cough and pleuritic abdominal pain. History of atrial fibrillation, interstitial lung disease, breast malignancy. EXAM: PORTABLE CHEST 1 VIEW COMPARISON:  Portable chest x-ray of December 23, 2017 FINDINGS: The lungs are mildly hypoinflated. The cardiac silhouette is enlarged. The pulmonary vascularity is engorged. There small bilateral pleural effusions. There is calcification in the wall of the aortic arch. The bony thorax exhibits no acute abnormality. IMPRESSION: CHF with mild interstitial edema and small bilateral pleural effusions. The appearance of the chest has deteriorated slightly since the study of 3 days ago. Thoracic aortic atherosclerosis. Electronically Signed   By: David  Jordan M.D.   On: 12/26/2017 15:43   Dg Chest Port 1 View  Result Date: 12/23/2017 CLINICAL DATA:  Dyspnea and confusion EXAM: PORTABLE CHEST 1 VIEW COMPARISON:  12/22/2017, 02/04/2015, CT chest 03/28/2016 FINDINGS: Cardiomegaly with vascular congestion, small pleural effusion and interstitial edema. Confluent opacity  at the left base. Enlarged central pulmonary vessels. Aortic atherosclerosis. No pneumothorax. IMPRESSION: 1. Cardiomegaly with vascular congestion, small pleural effusion and mild increased interstitial opacity consistent with edema 2. Patchy consolidation at the left base may reflect atelectasis or superimposed pneumonia Electronically Signed   By: Kim  Fujinaga M.D.   On: 12/23/2017 19:32   Us Abdomen Limited Ruq  Result Date: 12/23/2017 CLINICAL DATA:  Nausea and vomiting for 1 day EXAM: ULTRASOUND ABDOMEN LIMITED RIGHT UPPER QUADRANT COMPARISON:  Abdominal CT 03/09/2015 FINDINGS: Gallbladder: Shadowing gallstone in the neck. There is generalized gallbladder   wall thickening to 10 mm (this measurement includes pericholecystic fat) without focal tenderness or wall striation. Gallbladder is incompletely distended. Common bile duct: Diameter: 4 mm Liver: No focal lesion identified. Within normal limits in parenchymal echogenicity. Portal vein is patent on color Doppler imaging with normal direction of blood flow towards the liver. Trace right pleural effusion IMPRESSION: Cholelithiasis. There is gallbladder wall thickening but this may be related to under distention of the gallbladder. No focal tenderness as would be expected for acute cholecystitis. Electronically Signed   By: Jonathon  Watts M.D.   On: 12/23/2017 11:09    Jehanzeb Memon, MD  [facility] Pager [phone]  If 7PM-7AM, please contact night-coverage [email or web address] Password [email or web address] 12/28/2017, 5:06 PM   LOS: 6 days   

## 2017-12-28 NOTE — Care Management Note (Signed)
Case Management Note  Patient Details  Name: Karina Tran MRN: [id number] Date of Birth: December 14, 1940    Expected Discharge Date:  12/25/17               Expected Discharge Plan:  [place]  In-House Referral:  NA  Discharge planning Services  CM Consult  Post Acute Care Choice:  NA Choice offered to:  NA   Status of Service:  Completed, signed off  Additional Comments: PT has seen pt and recommends no f/u. Pt will DC home with daughter. No CM needs noted at this time. Will sign off.   Sherald Barge, RN 12/28/2017, 8:43 AM

## 2017-12-29 LAB — LIPASE, BLOOD: LIPASE: 28 U/L (ref 11–51)

## 2017-12-29 LAB — COMPREHENSIVE METABOLIC PANEL
ALK PHOS: 79 U/L (ref 38–126)
ALT: 751 U/L — AB (ref 0–44)
ANION GAP: 12 (ref 5–15)
AST: 114 U/L — ABNORMAL HIGH (ref 15–41)
Albumin: 3 g/dL — ABNORMAL LOW (ref 3.5–5.0)
BUN: 36 mg/dL — ABNORMAL HIGH (ref 8–23)
CALCIUM: 8.9 mg/dL (ref 8.9–10.3)
CO2: 29 mmol/L (ref 22–32)
CREATININE: 1.31 mg/dL — AB (ref 0.44–1.00)
Chloride: 95 mmol/L — ABNORMAL LOW (ref 98–111)
GFR, EST AFRICAN AMERICAN: 44 mL/min — AB (ref >60)
GFR, EST NON AFRICAN AMERICAN: 38 mL/min — AB (ref >60)
Glucose, Bld: 139 mg/dL — ABNORMAL HIGH (ref 70–99)
Potassium: 4 mmol/L (ref 3.5–5.1)
Sodium: 136 mmol/L (ref 135–145)
TOTAL PROTEIN: 5.8 g/dL — AB (ref 6.5–8.1)
Total Bilirubin: 1.5 mg/dL — ABNORMAL HIGH (ref 0.3–1.2)

## 2017-12-29 MED ORDER — HYDROMORPHONE HCL 1 MG/ML IJ SOLN: 1.0000 mg | INTRAMUSCULAR | Status: DC | PRN

## 2017-12-29 MED ORDER — HYDROMORPHONE HCL 1 MG/ML IJ SOLN
0.5000 mg | INTRAMUSCULAR | Status: DC | PRN
  Administered 2017-12-29 – 2017-12-30 (×5): 0.5 mg via INTRAVENOUS
  Filled 2017-12-29 (×5): qty 0.5

## 2017-12-29 MED ORDER — FUROSEMIDE 10 MG/ML IJ SOLN
60.0000 mg | Freq: Two times a day (BID) | INTRAMUSCULAR | Status: DC
  Administered 2017-12-29 – 2017-12-31 (×4): 60 mg via INTRAVENOUS
  Filled 2017-12-29 (×4): qty 6

## 2017-12-29 MED ORDER — HYDROMORPHONE HCL 1 MG/ML IJ SOLN
1.0000 mg | Freq: Once | INTRAMUSCULAR | Status: AC
  Administered 2017-12-29: 1 mg via INTRAVENOUS
  Filled 2017-12-29: qty 1

## 2017-12-29 NOTE — Progress Notes (Signed)
PROGRESS NOTE  Karina Tran VWU:[id number] DOB: [birth year] DOA: 12/22/2017 PCP: Curly Rim, MD  Brief History: 77 year old female with a history of persistent atrial fibrillation, hypertension, interstitial lung disease presenting with acute onset of nausea, vomiting, generalized weakness, diaphoresis that began around 2 PM on 12/22/2017. The patient had been in her usual state of health up until 2 PM on 12/22/2017. The patient decided to go take a nap after brunch on 12/22/2017. She woke up to go to the bathroom. At that time, the patient felt lightheaded and dizzy. She subsequently had a loose stool without any hematochezia or melena. When she went back to her bedroom, the patient had generalized weakness with diaphoresis, but she denied any chest discomfort, shortness of breath, visual disturbance, focal extremity weakness. As result, EMS was activated. The patient denied any recent travels, exotic or undercooked foods, or sick contacts. She lives with her daughter and son-in-law and her grandchildren all of whom are in good health. She denies any fevers, chills, abdominal pain, dysuria, hematuria, hematochezia, melena, rashes, synovitis. She has not had any recent travels. She has not had any recent changes in any of her medications. The patient had numerous episodes of dry heaves on 12/22/2017 which continued in the emergency department. In the emergency department, the patient was noted to be afebrile but hypotensive with blood pressure of 77/58. She was saturating 100% on room air. She was noted to have elevated LFTs with AST 97, ALT 51, alk phosphatase 85, total bilirubin 1.1 at the time of admission. WBC was 10.2. Lactic acid peaked at 3.9. She was started on vancomycin and Zosyn after blood cultures and urine cultures were obtained. Chest x-ray showed pulmonary fibrosis without any consolidations.   Assessment/Plan: Sepsis -general surgery consulted for  concerns for cholecystitis--discussed with Dr. Aviva Signs, unlikely to be related to her gall bladder -blood cultures remain neg -PCT <0.10 -lactic acid peaked 4.2, but has since improved -check cortisol--28.8 -no convincing evidence of infectious process. Antibiotics discontinued on 9/18  Transaminitis -likely related to ischemic hepatitis -12/23/17 RUQ US--GB wall thickening with cholelithiasis, possible GB neck stone -12/24/17 CT abd--GB wall thickening with surrounding fluid--?cholecystitis vs ascites/anasarca -discussed with general surgery and unlikely this is related to her gall bladder -GI consult appreciated -viral hepatitis panel unrevealing -overall transaminases improving  AKI -due to sepsis and hemodynamic changes -renal US--no hydronephrosis -Baseline creatinine 0.6-0.8 -expecting serum creatinine to reach plateau 12/26/17 -was receiving bicarbonate infusion for metabolic acidosis, which was stopped on 9/17 - creatinine has been trending down -continue to monitor.  Coagulopathy -due to rivaroxaban and liver injury -vitamin K x 1 on9/16/19 -daily INR is trending down  Elevated troponin -no chest pain -personally reviewed EKG--sinus, nonspecific T wave changes -due to demand ischemia  Acute on chronic combined CHF.  -patient continues to have evidence of volume overload with significant peripheral edema and shortness of breath -chest xray 9/18 showed persistent CHF -will increase dosing on IV lasix to 2m BID -monitor intake and output -Echo shows EF of 45% with grade 2 DD  Cardiomyopathy -suspect related to Etoh, stress -12/24/17 Echo--EF 45%, diffuse HK, G2DD, PASP 33 -will ultimately need cardiology follow up  Pyuria -urine culture neg  Persistent atrial fibrillation -heart rate stable on atenolol -Continue flecainide -Anticoagulated with Xarelto  Hyperglycemia -Hemoglobin A1c--5.4  Interstitial Lung Disease -stable on RA  Etoh  Dependence -12/24/17 pt revealed she drinks 6 pack of beer on sat and  sun -CIWA -no signs of withdrawal at present  Hyponatremia -due to AKI and defect in Na reabsorption -resolved  Diarrhea -C. difficile negative. -Treat supportively with Imodium as needed. -improving  Abdominal pain -LFTs trending down -no relation to po intake -check lipase -if lipase is not elevated, can consider HIDA scan    Disposition Plan: Discharge home once bowel movements have improved and overall volume status has improved  Family Communication:Discussed with daughter at the bedside  Consultants:GI  Code Status: FULL  DVT Prophylaxis:xarelto   Procedures: As Listed in Progress Note Above  Antibiotics: vanco 9/14>>>9/16 Zosyn 9/14>>>9/18  Subjective: Has developed abdominal pain in upper abdomen. Worse with coughing. Loose stools are better with imodium  Objective: Vitals:   12/29/17 0737 12/29/17 1106 12/29/17 1115 12/29/17 1441  BP:   (!) 157/77   Pulse:   68   Resp:   19   Temp:      TempSrc:      SpO2: 98% 97% 98% 93%  Weight:      Height:        Intake/Output Summary (Last 24 hours) at 12/29/2017 1532 Last data filed at 12/29/2017 1305 Gross per 24 hour  Intake 840 ml  Output 1150 ml  Net -310 ml   Weight change:  Exam:  General exam: Alert, awake, oriented x 3 Respiratory system: crackles at bases. Respiratory effort normal. Cardiovascular system:RRR. No murmurs, rubs, gallops. Gastrointestinal system: Abdomen is nondistended, soft and tender in epigastrium and RUQ. No organomegaly or masses felt. Normal bowel sounds heard. Central nervous system: Alert and oriented. No focal neurological deficits. Extremities: 1-2+ edema bilaterally Skin: No rashes, lesions or ulcers Psychiatry: Judgement and insight appear normal. Mood & affect appropriate.      Data Reviewed: I have personally reviewed following labs and imaging studies Basic Metabolic  Panel: Recent Labs  Lab 12/25/17 0410 12/26/17 0412 12/27/17 0406 12/28/17 0507 12/29/17 0612  NA 135 137 137 135 136  K 3.5 2.4* 3.5 3.3* 4.0  CL 103 98 98 96* 95*  CO2 _0 GLUCOSE 117* 100* 205* 166* 139*  BUN _1 28* 36*  CREATININE 2.27* 2.07* 1.69* 1.42* 1.31*  CALCIUM 7.4* 7.8* 8.3* 8.6* 8.9  MG  --  1.4* 2.2  --   --    Liver Function Tests: Recent Labs  Lab 12/25/17 0410 12/26/17 0412 12/27/17 0406 12/28/17 0507 12/29/17 0612  AST 4,785* 1,724* 715* 265* 114*  ALT 2,826* 2,039* 1,497* 1,029* 751*  ALKPHOS 53 60 98 84 79  BILITOT 1.4* 1.3* 1.5* 1.6* 1.5*  PROT 4.9* 5.2* 5.7* 5.7* 5.8*  ALBUMIN 2.6* 2.7* 2.9* 2.9* 3.0*   Recent Labs  Lab 12/23/17 0901 12/29/17 0612  LIPASE 26 28   Recent Labs  Lab 12/23/17 2255  AMMONIA 20   Coagulation Profile: Recent Labs  Lab 12/24/17 0836 12/25/17 0846 12/26/17 0412 12/27/17 0406 12/28/17 0507  INR 6.65* 3.44 1.87 1.43 2.51   CBC: Recent Labs  Lab 12/22/17 1745 12/23/17 0405 12/23/17 2008 12/24/17 0126 12/25/17 0410 12/26/17 0412  WBC 10.2 8.2 10.7* 12.4* 11.3* 9.6  NEUTROABS 7.6  --  7.6 9.8*  --   --   HGB 14.5 12.9 12.9 13.3 12.4 12.3  HCT 43.9 40.6 40.6 41.0 35.9* 36.8  MCV 94.2 93.8 95.8 94.9 91.8 92.2  PLT 223 205 191 188 155 162   Cardiac Enzymes: Recent Labs  Lab 12/23/17 0405 12/23/17 0901 12/23/17 2008 12/24/17 0126 12/24/17 0725  CKTOTAL  --  22*  --   --   --   TROPONINI <0.03 0.03* 0.03* 0.03* 0.03*   BNP: Invalid input(s): POCBNP CBG: Recent Labs  Lab 12/23/17 1901 12/25/17 0409  GLUCAP 126* 109*   HbA1C: No results for input(s): HGBA1C in the last 72 hours. Urine analysis:    Component Value Date/Time   COLORURINE AMBER (A) 12/22/2017 1838   APPEARANCEUR CLOUDY (A) 12/22/2017 1838   LABSPEC 1.017 12/22/2017 1838   PHURINE 5.0 12/22/2017 1838   GLUCOSEU 50 (A) 12/22/2017 1838   HGBUR NEGATIVE 12/22/2017 1838   BILIRUBINUR NEGATIVE 12/22/2017  1838   KETONESUR NEGATIVE 12/22/2017 1838   PROTEINUR >=300 (A) 12/22/2017 1838   UROBILINOGEN 1.0 02/04/2015 1533   NITRITE NEGATIVE 12/22/2017 1838   LEUKOCYTESUR NEGATIVE 12/22/2017 1838   Sepsis Labs: _0 (procalcitonin:4,lacticidven:4) ) Recent Results (from the past 240 hour(s))  Blood culture (routine x 2)     Status: None   Collection Time: 12/22/17  6:45 PM  Result Value Ref Range Status   Specimen Description RIGHT ANTECUBITAL  Final   Special Requests   Final    BOTTLES DRAWN AEROBIC AND ANAEROBIC Blood Culture adequate volume   Culture   Final    NO GROWTH 5 DAYS Performed at [facility], [address], [place], [place] [address]    Report Status 12/27/2017 FINAL  Final  Blood culture (routine x 2)     Status: None   Collection Time: 12/22/17  7:30 PM  Result Value Ref Range Status   Specimen Description RIGHT ANTECUBITAL  Final   Special Requests   Final    BOTTLES DRAWN AEROBIC ONLY Blood Culture adequate volume   Culture   Final    NO GROWTH 5 DAYS Performed at [facility], [address] [place], [place] [address]    Report Status 12/27/2017 FINAL  Final  Urine culture     Status: None   Collection Time: 12/22/17  9:31 PM  Result Value Ref Range Status   Specimen Description   Final    URINE, RANDOM Performed at [facility], [address], [place], [place] [address]    Special Requests   Final    NONE Performed at [facility], [address], [place], [place] [address]    Culture   Final    NO GROWTH Performed at [facility], [place] [address], [place], [place] [address]    Report Status 12/24/2017 FINAL  Final  MRSA PCR Screening     Status: None   Collection Time: 12/22/17 11:18 PM  Result Value Ref Range Status   MRSA by PCR NEGATIVE NEGATIVE Final    Comment:        The GeneXpert MRSA Assay (FDA approved for NASAL specimens only), is one component of a comprehensive MRSA colonization surveillance program. It is not intended  to diagnose MRSA infection nor to guide or monitor treatment for MRSA infections. Performed at [facility], [address] [place], [place] [address]   Culture, blood (x 2)     Status: None   Collection Time: 12/23/17 10:57 PM  Result Value Ref Range Status   Specimen Description BLOOD RIGHT HAND  Final   Special Requests   Final    BOTTLES DRAWN AEROBIC ONLY Blood Culture adequate volume   Culture   Final    NO GROWTH 5 DAYS Performed at [facility], [address] [place], [place] [address]    Report Status 12/28/2017 FINAL  Final  Culture, blood (x 2)     Status: None   Collection Time: 12/23/17 10:57 PM  Result Value Ref Range Status   Specimen Description RIGHT ANTECUBITAL  Final   Special Requests   Final    BOTTLES DRAWN AEROBIC AND ANAEROBIC Blood Culture results may not be optimal due to an inadequate volume of blood received in culture bottles   Culture   Final    NO GROWTH 5 DAYS Performed at [facility], [address] [place], [place] [address]    Report Status 12/28/2017 FINAL  Final  C difficile quick scan w PCR reflex     Status: None   Collection Time: 12/28/17  2:17 PM  Result Value Ref Range Status   C Diff antigen NEGATIVE NEGATIVE Final   C Diff toxin NEGATIVE NEGATIVE Final   C Diff interpretation No C. difficile detected.  Final    Comment: Performed at [facility], [address], [place], [place] [address]     Scheduled Meds: . atenolol  50 mg Oral Daily  . flecainide  50 mg Oral Q12H  . furosemide  60 mg Intravenous BID  . ipratropium-albuterol  3 mL Nebulization TID  . mouth rinse  15 mL Mouth Rinse q12n4p  . pantoprazole  40 mg Oral q morning - 10a  . Rivaroxaban  15 mg Oral Daily   Continuous Infusions: . sodium chloride Stopped (12/26/17 1948)    Procedures/Studies: Ct Abdomen Pelvis Wo Contrast  Result Date: 12/24/2017 CLINICAL DATA:  Cirrhosis fatty liver nausea vomiting and loose stools EXAM: CT ABDOMEN AND PELVIS WITHOUT  CONTRAST TECHNIQUE: Multidetector CT imaging of the abdomen and pelvis was performed following the standard protocol without IV contrast. COMPARISON:  Ultrasound 12/23/2017, 03/09/2015 CT FINDINGS: Lower chest: Lung bases demonstrate small bilateral pleural effusions. Bibasilar atelectasis. Septal thickening suggesting mild edema. Postsurgical changes of the left breast. Cardiomegaly. Hepatobiliary: No focal hepatic abnormality. No biliary dilatation. Slight increased intraluminal density at the gallbladder. Wall thickening and surrounding fluid. Pancreas: Unremarkable. No pancreatic ductal dilatation or surrounding inflammatory changes. Spleen: Normal in size without focal abnormality. Adrenals/Urinary Tract: Adrenal glands are normal. No hydronephrosis. Perinephric fat stranding. Bladder contains a Foley catheter Stomach/Bowel: Stomach nonenlarged. No dilated small bowel. No colon wall thickening. Appendix not clearly identified. Vascular/Lymphatic: Moderate aortic atherosclerosis without aneurysm. No significantly enlarged lymph nodes. Reproductive: Uterus and bilateral adnexa are unremarkable. Other: No free air. Small amount of ascites within the abdomen and pelvis. Diffuse body wall edema consistent with anasarca. Musculoskeletal: Scoliosis of the spine. Treated compression deformity at T11. Partial fusion L1-L2. Degenerative changes of the lumbar spine. Left hip replacement. IMPRESSION: 1. Negative for bowel obstruction or bowel wall thickening. 2. Gallbladder wall thickening with surrounding edema and or fluid, corresponding to thickened wall noted on sonography. Findings are nonspecific and could be seen in the setting of liver disease, acute or chronic cholecystitis, and edema forming states. If acute gallbladder disease remains a concern, further evaluation with hepatobiliary nuclear medicine imaging could be obtained. 3. Anasarca with small amount of ascites in the abdomen and pelvis. 4. Small pleural  effusions and bibasilar atelectasis. Septal thickening at the bases suggesting edema. Electronically Signed   By: Donavan Foil M.D.   On: 12/24/2017 00:22   Dg Chest 2 View  Result Date: 12/22/2017 CLINICAL DATA:  77 y/o  F; chest pain with intermittent nausea. EXAM: CHEST - 2 VIEW COMPARISON:  03/28/2016 CT chest.  11/16/2014 chest radiograph. FINDINGS: Mild cardiomegaly. Calcific aortic  atherosclerosis. Coarse reticular opacities greatest at the lung bases compatible pulmonary fibrosis. No consolidation, effusion, or pneumothorax. T12 chronic compression deformity. Bones are otherwise unremarkable. IMPRESSION: 1. Mild cardiomegaly. 2. Basilar predominant pulmonary fibrosis.  No focal consolidation. 3.  Aortic Atherosclerosis (ICD10-I70.0). 4. Chronic T12 compression deformity. Electronically Signed   By: Kristine Garbe M.D.   On: 12/22/2017 19:34   Ct Head Wo Contrast  Result Date: 12/23/2017 CLINICAL DATA:  Acute confusion. EXAM: CT HEAD WITHOUT CONTRAST TECHNIQUE: Contiguous axial images were obtained from the base of the skull through the vertex without intravenous contrast. COMPARISON:  None. FINDINGS: Brain: Generalized atrophy. No sign of acute infarction. 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 Chronic small-vessel changes of the deep white matter. No mass lesion, hemorrhage, hydrocephalus or extra-axial collection. Vascular: There is atherosclerotic calcification of the major vessels at the base of the brain. Skull: Negative Sinuses/Orbits: Clear/normal Other: None IMPRESSION: No acute finding by CT. Atrophy and chronic small-vessel ischemic change. 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 Electronically Signed   By: Nelson Chimes M.D.   On: 12/23/2017 19:59   US Renal  Result Date: 12/24/2017 CLINICAL DATA:  Acute renal insufficiency EXAM: RENAL ULTRASOUND COMPARISON:  CT abdomen and pelvis December 23, 2017 FINDINGS: Right Kidney:  Length: 10.9 cm. Echogenicity and renal cortical thickness are within normal limits. No mass, perinephric fluid, or hydronephrosis visualized. No sonographically demonstrable calculus or ureterectasis. Left Kidney: Length: 10.7 cm. Echogenicity and renal cortical thickness are within normal limits. No perinephric fluid or hydronephrosis visualized. There is a cyst arising from the lower pole left kidney measuring 1.7 x 1.7 x 1.0 cm. No sonographically demonstrable calculus or ureterectasis. Bladder: Decompressed with Foley catheter and cannot be assessed. There is ascites.  There is a left pleural effusion. IMPRESSION: Small left renal cyst. Kidneys otherwise appear unremarkable bilaterally. Urinary bladder is decompressed and cannot be assessed at this time. There is ascites as well as a left pleural effusion evident. Electronically Signed   By: Lowella Grip III M.D.   On: 12/24/2017 10:41   Dg Chest Port 1 View  Result Date: 12/26/2017 CLINICAL DATA:  Wheezing and cough and pleuritic abdominal pain. History of atrial fibrillation, interstitial lung disease, breast malignancy. EXAM: PORTABLE CHEST 1 VIEW COMPARISON:  Portable chest x-ray of December 23, 2017 FINDINGS: The lungs are mildly hypoinflated. The cardiac silhouette is enlarged. The pulmonary vascularity is engorged. There small bilateral pleural effusions. There is calcification in the wall of the aortic arch. The bony thorax exhibits no acute abnormality. IMPRESSION: CHF with mild interstitial edema and small bilateral pleural effusions. The appearance of the chest has deteriorated slightly since the study of 3 days ago. Thoracic aortic atherosclerosis. Electronically Signed   By: David  Martinique M.D.   On: 12/26/2017 15:43   Dg Chest Port 1 View  Result Date: 12/23/2017 CLINICAL DATA:  Dyspnea and confusion EXAM: PORTABLE CHEST 1 VIEW COMPARISON:  12/22/2017, 02/04/2015, CT chest 03/28/2016 FINDINGS: Cardiomegaly with vascular congestion,  small pleural effusion and interstitial edema. Confluent opacity at the left base. Enlarged central pulmonary vessels. Aortic atherosclerosis. No pneumothorax. IMPRESSION: 1. Cardiomegaly with vascular congestion, small pleural effusion and mild increased interstitial opacity consistent with edema 2. Patchy consolidation at the left base may reflect atelectasis or superimposed pneumonia Electronically Signed   By: Donavan Foil M.D.   On: 12/23/2017 19:32   US Abdomen Limited Ruq  Result Date: 12/23/2017 CLINICAL DATA:  Nausea and vomiting for 1 day EXAM:  ULTRASOUND ABDOMEN LIMITED RIGHT UPPER QUADRANT COMPARISON:  Abdominal CT 03/09/2015 FINDINGS: Gallbladder: Shadowing gallstone in the neck. There is generalized gallbladder wall thickening to 10 mm (this measurement includes pericholecystic fat) without focal tenderness or wall striation. Gallbladder is incompletely distended. Common bile duct: Diameter: 4 mm Liver: No focal lesion identified. Within normal limits in parenchymal echogenicity. Portal vein is patent on color Doppler imaging with normal direction of blood flow towards the liver. Trace right pleural effusion IMPRESSION: Cholelithiasis. There is gallbladder wall thickening but this may be related to under distention of the gallbladder. No focal tenderness as would be expected for acute cholecystitis. Electronically Signed   By: Monte Fantasia M.D.   On: 12/23/2017 11:09    Kathie Dike, MD  [facility] Pager [phone]  If 7PM-7AM, please contact night-coverage [email or web address] Password [email or web address] 12/29/2017, 3:32 PM   LOS: 7 days

## 2017-12-29 NOTE — Progress Notes (Signed)
Unable to measure all of patient's output due to patient moving hat in commode for bathroom for bowel movement.

## 2017-12-30 ENCOUNTER — Inpatient Hospital Stay (HOSPITAL_COMMUNITY): Payer: Medicare Other

## 2017-12-30 LAB — COMPREHENSIVE METABOLIC PANEL
ALT: 548 U/L — ABNORMAL HIGH (ref 0–44)
ANION GAP: 11 (ref 5–15)
AST: 63 U/L — ABNORMAL HIGH (ref 15–41)
Albumin: 3.2 g/dL — ABNORMAL LOW (ref 3.5–5.0)
Alkaline Phosphatase: 77 U/L (ref 38–126)
BUN: 42 mg/dL — ABNORMAL HIGH (ref 8–23)
CALCIUM: 9 mg/dL (ref 8.9–10.3)
CHLORIDE: 95 mmol/L — AB (ref 98–111)
CO2: 31 mmol/L (ref 22–32)
Creatinine, Ser: 1.33 mg/dL — ABNORMAL HIGH (ref 0.44–1.00)
GFR calc non Af Amer: 37 mL/min — ABNORMAL LOW (ref >60)
GFR, EST AFRICAN AMERICAN: 43 mL/min — AB (ref >60)
Glucose, Bld: 154 mg/dL — ABNORMAL HIGH (ref 70–99)
POTASSIUM: 3.8 mmol/L (ref 3.5–5.1)
SODIUM: 137 mmol/L (ref 135–145)
Total Bilirubin: 1.2 mg/dL (ref 0.3–1.2)
Total Protein: 5.9 g/dL — ABNORMAL LOW (ref 6.5–8.1)

## 2017-12-30 MED ORDER — GUAIFENESIN ER 600 MG PO TB12
1200.0000 mg | ORAL_TABLET | Freq: Two times a day (BID) | ORAL | Status: DC
  Administered 2017-12-30 – 2018-01-01 (×4): 1200 mg via ORAL
  Filled 2017-12-30 (×4): qty 2

## 2017-12-30 MED ORDER — PREDNISONE 20 MG PO TABS
40.0000 mg | ORAL_TABLET | Freq: Every day | ORAL | Status: DC
  Administered 2017-12-31 – 2018-01-01 (×2): 40 mg via ORAL
  Filled 2017-12-30 (×2): qty 2

## 2017-12-30 MED ORDER — BUDESONIDE 0.25 MG/2ML IN SUSP
0.2500 mg | Freq: Two times a day (BID) | RESPIRATORY_TRACT | Status: DC
  Administered 2017-12-30 – 2018-01-01 (×4): 0.25 mg via RESPIRATORY_TRACT
  Filled 2017-12-30 (×4): qty 2

## 2017-12-30 NOTE — Progress Notes (Signed)
PROGRESS NOTE  Karina Tran UKG:[id number] DOB: 03-15-1941 DOA: 12/22/2017 PCP: Curly Rim, MD  Brief History: 77 year old female with a history of persistent atrial fibrillation, hypertension, interstitial lung disease presenting with acute onset of nausea, vomiting, generalized weakness, diaphoresis that began around 2 PM on 12/22/2017. The patient had been in her usual state of health up until 2 PM on 12/22/2017. The patient decided to go take a nap after brunch on 12/22/2017. She woke up to go to the bathroom. At that time, the patient felt lightheaded and dizzy. She subsequently had a loose stool without any hematochezia or melena. When she went back to her bedroom, the patient had generalized weakness with diaphoresis, but she denied any chest discomfort, shortness of breath, visual disturbance, focal extremity weakness. As result, EMS was activated. The patient denied any recent travels, exotic or undercooked foods, or sick contacts. She lives with her daughter and son-in-law and her grandchildren all of whom are in good health. She denies any fevers, chills, abdominal pain, dysuria, hematuria, hematochezia, melena, rashes, synovitis. She has not had any recent travels. She has not had any recent changes in any of her medications. The patient had numerous episodes of dry heaves on 12/22/2017 which continued in the emergency department. In the emergency department, the patient was noted to be afebrile but hypotensive with blood pressure of 77/58. She was saturating 100% on room air. She was noted to have elevated LFTs with AST 97, ALT 51, alk phosphatase 85, total bilirubin 1.1 at the time of admission. WBC was 10.2. Lactic acid peaked at 3.9. She was started on vancomycin and Zosyn after blood cultures and urine cultures were obtained. Chest x-ray showed pulmonary fibrosis without any consolidations.   Assessment/Plan: Sepsis -general surgery consulted for  initial concerns for cholecystitis--discussed with Dr. Aviva Signs, unlikely to be related to her gall bladder -blood cultures remain neg -PCT <0.10 -lactic acid peaked 4.2, but has since improved -check cortisol--28.8 -no convincing evidence of infectious process. Antibiotics discontinued on 9/18  Transaminitis -likely related to ischemic hepatitis -12/23/17 RUQ US--GB wall thickening with cholelithiasis, possible GB neck stone -12/24/17 CT abd--GB wall thickening with surrounding fluid--?cholecystitis vs ascites/anasarca -discussed with general surgery and unlikely that rising transaminases is related to her gall bladder -GI consult appreciated -viral hepatitis panel unrevealing -overall transaminases improving  AKI -due to sepsis and hemodynamic changes/hypotension -renal US--no hydronephrosis -Baseline creatinine 0.6-0.8 -Improved with IV fluids  Elevated troponin -no chest pain -personally reviewed EKG--sinus, nonspecific T wave changes -due to demand ischemia  Acute on chronic combined CHF.  -patient continues to have evidence of volume overload with significant peripheral edema  -chest xray 9/18 showed persistent CHF -Continue IV Lasix -monitor intake and output -Echo shows EF of 45% with grade 2 DD  Cardiomyopathy -suspect related to Etoh, stress -12/24/17 Echo--EF 45%, diffuse HK, G2DD, PASP 33 -will need cardiology follow up  Persistent atrial fibrillation -heart rate stable on atenolol -Continue flecainide -Anticoagulated with Xarelto  Hyperglycemia -Hemoglobin A1c--5.4  Interstitial Lung Disease -Currently on 2 L nasal cannula  Etoh Dependence -12/24/17 pt revealed she drinks 6 pack of beer on sat and sun -CIWA -no signs of withdrawal at present  Hyponatremia -due to AKI and defect in Na reabsorption -resolved  Diarrhea -C. difficile negative. -Treat supportively with Imodium as needed. -improving  Abdominal pain -LFTs trending  down -no relation to po intake -Lipase is normal. -We will check HIDA scan  Shortness of breath -Suspect this is multifactorial -She does have possible interstitial edema from volume overload and is receiving diuretics. -She also has a history of interstitial lung disease which may also be contributing. -We will start the patient on prednisone, inhaled corticosteroids and continue on bronchodilators.  Disposition Plan: Discharge home once bowel movements have improved and overall volume status has improved  Family Communication:Discussed with daughter at the bedside  Consultants:GI  Code Status: FULL  DVT Prophylaxis:xarelto   Procedures: As Listed in Progress Note Above  Antibiotics: vanco 9/14>>>9/16 Zosyn 9/14>>>9/18  Subjective: Still has some abdominal pain in epigastrium/right upper quadrant when coughing and with palpation.  Continues to complain of significant lower extremity edema.  She continues to have a cough.  Loose stools are mildly better with Imodium, but she is still having them.  Objective: Vitals:   12/29/17 1936 12/30/17 0535 12/30/17 0716 12/30/17 0815  BP:  132/74    Pulse:  64    Resp:  18    Temp:  98 F (36.7 C)    TempSrc:  Oral    SpO2: 99% 97%  100%  Weight:   83 kg   Height:        Intake/Output Summary (Last 24 hours) at 12/30/2017 1817 Last data filed at 12/30/2017 1609 Gross per 24 hour  Intake 840 ml  Output 2400 ml  Net -1560 ml   Weight change:  Exam:  General exam: Alert, awake, oriented x 3 Respiratory system: Coarse breath sounds at bases. Respiratory effort normal. Cardiovascular system:RRR. No murmurs, rubs, gallops. Gastrointestinal system: Abdomen is nondistended, soft and tender in epigastrium/right upper quadrant. No organomegaly or masses felt. Normal bowel sounds heard. Central nervous system: Alert and oriented. No focal neurological deficits. Extremities: 1-2+ edema bilaterally Skin: No rashes,  lesions or ulcers Psychiatry: Judgement and insight appear normal. Mood & affect appropriate.   Data Reviewed: I have personally reviewed following labs and imaging studies Basic Metabolic Panel: Recent Labs  Lab 12/26/17 0412 12/27/17 0406 12/28/17 0507 12/29/17 0612 12/30/17 1004  NA 137 137 135 136 137  K 2.4* 3.5 3.3* 4.0 3.8  CL 98 98 96* 95* 95*  CO2 '25 28 28 29 31  '$ GLUCOSE 100* 205* 166* 139* 154*  BUN 22 20 28* 36* 42*  CREATININE 2.07* 1.69* 1.42* 1.31* 1.33*  CALCIUM 7.8* 8.3* 8.6* 8.9 9.0  MG 1.4* 2.2  --   --   --    Liver Function Tests: Recent Labs  Lab 12/26/17 0412 12/27/17 0406 12/28/17 0507 12/29/17 0612 12/30/17 1004  AST 1,724* 715* 265* 114* 63*  ALT 2,039* 1,497* 1,029* 751* 548*  ALKPHOS 60 98 84 79 77  BILITOT 1.3* 1.5* 1.6* 1.5* 1.2  PROT 5.2* 5.7* 5.7* 5.8* 5.9*  ALBUMIN 2.7* 2.9* 2.9* 3.0* 3.2*   Recent Labs  Lab 12/29/17 0612  LIPASE 28   Recent Labs  Lab 12/23/17 2255  AMMONIA 20   Coagulation Profile: Recent Labs  Lab 12/24/17 0836 12/25/17 0846 12/26/17 0412 12/27/17 0406 12/28/17 0507  INR 6.65* 3.44 1.87 1.43 2.51   CBC: Recent Labs  Lab 12/23/17 2008 12/24/17 0126 12/25/17 0410 12/26/17 0412  WBC 10.7* 12.4* 11.3* 9.6  NEUTROABS 7.6 9.8*  --   --   HGB 12.9 13.3 12.4 12.3  HCT 40.6 41.0 35.9* 36.8  MCV 95.8 94.9 91.8 92.2  PLT 191 188 155 162   Cardiac Enzymes: Recent Labs  Lab 12/23/17 2008 12/24/17 0126 12/24/17 0725  TROPONINI 0.03*  0.03* 0.03*   BNP: Invalid input(s): POCBNP CBG: Recent Labs  Lab 12/23/17 1901 12/25/17 0409  GLUCAP 126* 109*   HbA1C: No results for input(s): HGBA1C in the last 72 hours. Urine analysis:    Component Value Date/Time   COLORURINE AMBER (A) 12/22/2017 1838   APPEARANCEUR CLOUDY (A) 12/22/2017 1838   LABSPEC 1.017 12/22/2017 1838   PHURINE 5.0 12/22/2017 1838   GLUCOSEU 50 (A) 12/22/2017 1838   HGBUR NEGATIVE 12/22/2017 1838   BILIRUBINUR NEGATIVE  12/22/2017 1838   KETONESUR NEGATIVE 12/22/2017 1838   PROTEINUR >=300 (A) 12/22/2017 1838   UROBILINOGEN 1.0 02/04/2015 1533   NITRITE NEGATIVE 12/22/2017 1838   LEUKOCYTESUR NEGATIVE 12/22/2017 1838   Sepsis Labs: '@LABRCNTIP'$ (procalcitonin:4,lacticidven:4) ) Recent Results (from the past 240 hour(s))  Blood culture (routine x 2)     Status: None   Collection Time: 12/22/17  6:45 PM  Result Value Ref Range Status   Specimen Description RIGHT ANTECUBITAL  Final   Special Requests   Final    BOTTLES DRAWN AEROBIC AND ANAEROBIC Blood Culture adequate volume   Culture   Final    NO GROWTH 5 DAYS Performed at [facility], [address], [place], [place] [address]    Report Status 12/27/2017 FINAL  Final  Blood culture (routine x 2)     Status: None   Collection Time: 12/22/17  7:30 PM  Result Value Ref Range Status   Specimen Description RIGHT ANTECUBITAL  Final   Special Requests   Final    BOTTLES DRAWN AEROBIC ONLY Blood Culture adequate volume   Culture   Final    NO GROWTH 5 DAYS Performed at [facility], [address], [place], [place] [address]    Report Status 12/27/2017 FINAL  Final  Urine culture     Status: None   Collection Time: 12/22/17  9:31 PM  Result Value Ref Range Status   Specimen Description   Final    URINE, RANDOM Performed at [facility], [address] [place], [place] [address]    Special Requests   Final    NONE Performed at [facility], [address], [place], [place] [address]    Culture   Final    NO GROWTH Performed at [facility], [place] [address], [place], [place] [address]    Report Status 12/24/2017 FINAL  Final  MRSA PCR Screening     Status: None   Collection Time: 12/22/17 11:18 PM  Result Value Ref Range Status   MRSA by PCR NEGATIVE NEGATIVE Final    Comment:        The GeneXpert MRSA Assay (FDA approved for NASAL specimens only), is one component of a comprehensive MRSA colonization surveillance program. It is  not intended to diagnose MRSA infection nor to guide or monitor treatment for MRSA infections. Performed at [facility], [address] [place], [place] [address]   Culture, blood (x 2)     Status: None   Collection Time: 12/23/17 10:57 PM  Result Value Ref Range Status   Specimen Description BLOOD RIGHT HAND  Final   Special Requests   Final    BOTTLES DRAWN AEROBIC ONLY Blood Culture adequate volume   Culture   Final    NO GROWTH 5 DAYS Performed at [facility], [address], [place], [place] [address]    Report Status 12/28/2017 FINAL  Final  Culture, blood (x 2)     Status: None   Collection Time: 12/23/17 10:57 PM  Result Value Ref Range Status   Specimen Description RIGHT ANTECUBITAL  Final   Special Requests   Final    BOTTLES DRAWN AEROBIC AND ANAEROBIC Blood Culture results may not be optimal due to an inadequate volume of blood received in culture bottles   Culture   Final    NO GROWTH 5 DAYS Performed at [facility], [address] [place], [place] [address]    Report Status 12/28/2017 FINAL  Final  C difficile quick scan w PCR reflex     Status: None   Collection Time: 12/28/17  2:17 PM  Result Value Ref Range Status   C Diff antigen NEGATIVE NEGATIVE Final   C Diff toxin NEGATIVE NEGATIVE Final   C Diff interpretation No C. difficile detected.  Final    Comment: Performed at [facility], [address] [place], [place] [address]     Scheduled Meds: . atenolol  50 mg Oral Daily  . budesonide (PULMICORT) nebulizer solution  0.25 mg Nebulization BID  . flecainide  50 mg Oral Q12H  . furosemide  60 mg Intravenous BID  . guaiFENesin  1,200 mg Oral BID  . ipratropium-albuterol  3 mL Nebulization TID  . mouth rinse  15 mL Mouth Rinse q12n4p  . pantoprazole  40 mg Oral q morning - 10a  . [START ON 12/31/2017] predniSONE  40 mg Oral Q breakfast  . Rivaroxaban  15 mg Oral Daily   Continuous Infusions: . sodium chloride Stopped (12/26/17 1948)     Procedures/Studies: Ct Abdomen Pelvis Wo Contrast  Result Date: 12/24/2017 CLINICAL DATA:  Cirrhosis fatty liver nausea vomiting and loose stools EXAM: CT ABDOMEN AND PELVIS WITHOUT CONTRAST TECHNIQUE: Multidetector CT imaging of the abdomen and pelvis was performed following the standard protocol without IV contrast. COMPARISON:  Ultrasound 12/23/2017, 03/09/2015 CT FINDINGS: Lower chest: Lung bases demonstrate small bilateral pleural effusions. Bibasilar atelectasis. Septal thickening suggesting mild edema. Postsurgical changes of the left breast. Cardiomegaly. Hepatobiliary: No focal hepatic abnormality. No biliary dilatation. Slight increased intraluminal density at the gallbladder. Wall thickening and surrounding fluid. Pancreas: Unremarkable. No pancreatic ductal dilatation or surrounding inflammatory changes. Spleen: Normal in size without focal abnormality. Adrenals/Urinary Tract: Adrenal glands are normal. No hydronephrosis. Perinephric fat stranding. Bladder contains a Foley catheter Stomach/Bowel: Stomach nonenlarged. No dilated small bowel. No colon wall thickening. Appendix not clearly identified. Vascular/Lymphatic: Moderate aortic atherosclerosis without aneurysm. No significantly enlarged lymph nodes. Reproductive: Uterus and bilateral adnexa are unremarkable. Other: No free air. Small amount of ascites within the abdomen and pelvis. Diffuse body wall edema consistent with anasarca. Musculoskeletal: Scoliosis of the spine. Treated compression deformity at T11. Partial fusion L1-L2. Degenerative changes of the lumbar spine. Left hip replacement. IMPRESSION: 1. Negative for bowel obstruction or bowel wall thickening. 2. Gallbladder wall thickening with surrounding edema and or fluid, corresponding to thickened wall noted on sonography. Findings are nonspecific and could be seen in the setting of liver disease, acute or chronic cholecystitis, and edema forming states. If acute gallbladder  disease remains a concern, further evaluation with hepatobiliary nuclear medicine imaging could be obtained. 3. Anasarca with small amount of ascites in the abdomen and pelvis. 4. Small pleural effusions and bibasilar atelectasis. Septal thickening at the bases suggesting edema. Electronically Signed   By: Donavan Foil M.D.   On: 12/24/2017 00:22   Dg Chest 2 View  Result Date: 12/30/2017 CLINICAL DATA:  Short of breath. EXAM: CHEST - 2 VIEW COMPARISON:  12/26/2017 and older exams. FINDINGS: Mild enlargement  of the cardiopericardial silhouette. No mediastinal or hilar masses. Persistent lung base opacity consistent with a combination of small effusions and probable atelectasis. Bilateral interstitial thickening, chronic and stable from older studies. No convincing pneumonia. No pulmonary edema. No pneumothorax. IMPRESSION: 1. No significant change from most recent prior study allowing for differences technique. 2. Small pleural effusions with associated basilar atelectasis. 3. Chronic interstitial thickening consistent with fibrosis. No convincing pneumonia or pulmonary edema. Electronically Signed   By: Lajean Manes M.D.   On: 12/30/2017 14:36   Dg Chest 2 View  Result Date: 12/22/2017 CLINICAL DATA:  77 y/o  F; chest pain with intermittent nausea. EXAM: CHEST - 2 VIEW COMPARISON:  03/28/2016 CT chest.  11/16/2014 chest radiograph. FINDINGS: Mild cardiomegaly. Calcific aortic atherosclerosis. Coarse reticular opacities greatest at the lung bases compatible pulmonary fibrosis. No consolidation, effusion, or pneumothorax. T12 chronic compression deformity. Bones are otherwise unremarkable. IMPRESSION: 1. Mild cardiomegaly. 2. Basilar predominant pulmonary fibrosis.  No focal consolidation. 3.  Aortic Atherosclerosis (ICD10-I70.0). 4. Chronic T12 compression deformity. Electronically Signed   By: Kristine Garbe M.D.   On: 12/22/2017 19:34   Ct Head Wo Contrast  Result Date: 12/23/2017 CLINICAL  DATA:  Acute confusion. EXAM: CT HEAD WITHOUT CONTRAST TECHNIQUE: Contiguous axial images were obtained from the base of the skull through the vertex without intravenous contrast. COMPARISON:  None. FINDINGS: Brain: Generalized atrophy. No sign of acute infarction. 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 Chronic small-vessel changes of the deep white matter. No mass lesion, hemorrhage, hydrocephalus or extra-axial collection. Vascular: There is atherosclerotic calcification of the major vessels at the base of the brain. Skull: Negative Sinuses/Orbits: Clear/normal Other: None IMPRESSION: No acute finding by CT. Atrophy and chronic small-vessel ischemic change. 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 Electronically Signed   By: Nelson Chimes M.D.   On: 12/23/2017 19:59   US Renal  Result Date: 12/24/2017 CLINICAL DATA:  Acute renal insufficiency EXAM: RENAL ULTRASOUND COMPARISON:  CT abdomen and pelvis December 23, 2017 FINDINGS: Right Kidney: Length: 10.9 cm. Echogenicity and renal cortical thickness are within normal limits. No mass, perinephric fluid, or hydronephrosis visualized. No sonographically demonstrable calculus or ureterectasis. Left Kidney: Length: 10.7 cm. Echogenicity and renal cortical thickness are within normal limits. No perinephric fluid or hydronephrosis visualized. There is a cyst arising from the lower pole left kidney measuring 1.7 x 1.7 x 1.0 cm. No sonographically demonstrable calculus or ureterectasis. Bladder: Decompressed with Foley catheter and cannot be assessed. There is ascites.  There is a left pleural effusion. IMPRESSION: Small left renal cyst. Kidneys otherwise appear unremarkable bilaterally. Urinary bladder is decompressed and cannot be assessed at this time. There is ascites as well as a left pleural effusion evident. Electronically Signed   By: Lowella Grip III M.D.   On: 12/24/2017 10:41   Dg Chest Port 1  View  Result Date: 12/26/2017 CLINICAL DATA:  Wheezing and cough and pleuritic abdominal pain. History of atrial fibrillation, interstitial lung disease, breast malignancy. EXAM: PORTABLE CHEST 1 VIEW COMPARISON:  Portable chest x-ray of December 23, 2017 FINDINGS: The lungs are mildly hypoinflated. The cardiac silhouette is enlarged. The pulmonary vascularity is engorged. There small bilateral pleural effusions. There is calcification in the wall of the aortic arch. The bony thorax exhibits no acute abnormality. IMPRESSION: CHF with mild interstitial edema and small bilateral pleural effusions. The appearance of the chest has deteriorated slightly since the study of 3 days ago. Thoracic aortic atherosclerosis. Electronically Signed  By: David  Martinique M.D.   On: 12/26/2017 15:43   Dg Chest Port 1 View  Result Date: 12/23/2017 CLINICAL DATA:  Dyspnea and confusion EXAM: PORTABLE CHEST 1 VIEW COMPARISON:  12/22/2017, 02/04/2015, CT chest 03/28/2016 FINDINGS: Cardiomegaly with vascular congestion, small pleural effusion and interstitial edema. Confluent opacity at the left base. Enlarged central pulmonary vessels. Aortic atherosclerosis. No pneumothorax. IMPRESSION: 1. Cardiomegaly with vascular congestion, small pleural effusion and mild increased interstitial opacity consistent with edema 2. Patchy consolidation at the left base may reflect atelectasis or superimposed pneumonia Electronically Signed   By: Donavan Foil M.D.   On: 12/23/2017 19:32   US Abdomen Limited Ruq  Result Date: 12/23/2017 CLINICAL DATA:  Nausea and vomiting for 1 day EXAM: ULTRASOUND ABDOMEN LIMITED RIGHT UPPER QUADRANT COMPARISON:  Abdominal CT 03/09/2015 FINDINGS: Gallbladder: Shadowing gallstone in the neck. There is generalized gallbladder wall thickening to 10 mm (this measurement includes pericholecystic fat) without focal tenderness or wall striation. Gallbladder is incompletely distended. Common bile duct: Diameter: 4 mm  Liver: No focal lesion identified. Within normal limits in parenchymal echogenicity. Portal vein is patent on color Doppler imaging with normal direction of blood flow towards the liver. Trace right pleural effusion IMPRESSION: Cholelithiasis. There is gallbladder wall thickening but this may be related to under distention of the gallbladder. No focal tenderness as would be expected for acute cholecystitis. Electronically Signed   By: Monte Fantasia M.D.   On: 12/23/2017 11:09    Kathie Dike, MD  [facility] Pager [phone]  If 7PM-7AM, please contact night-coverage [email or web address] Password [facility] 12/30/2017, 6:17 PM   LOS: 8 days

## 2017-12-31 ENCOUNTER — Encounter: Payer: Self-pay | Admitting: Gastroenterology

## 2017-12-31 ENCOUNTER — Other Ambulatory Visit: Payer: Self-pay

## 2017-12-31 ENCOUNTER — Telehealth: Payer: Self-pay | Admitting: Gastroenterology

## 2017-12-31 ENCOUNTER — Encounter (HOSPITAL_COMMUNITY): Payer: Self-pay

## 2017-12-31 ENCOUNTER — Inpatient Hospital Stay (HOSPITAL_COMMUNITY): Payer: Medicare Other

## 2017-12-31 DIAGNOSIS — K759 Inflammatory liver disease, unspecified: Secondary | ICD-10-CM

## 2017-12-31 DIAGNOSIS — K769 Liver disease, unspecified: Secondary | ICD-10-CM

## 2017-12-31 DIAGNOSIS — K7200 Acute and subacute hepatic failure without coma: Secondary | ICD-10-CM

## 2017-12-31 LAB — COMPREHENSIVE METABOLIC PANEL
ALT: 438 U/L — ABNORMAL HIGH (ref 0–44)
ANION GAP: 13 (ref 5–15)
AST: 53 U/L — ABNORMAL HIGH (ref 15–41)
Albumin: 3.2 g/dL — ABNORMAL LOW (ref 3.5–5.0)
Alkaline Phosphatase: 74 U/L (ref 38–126)
BUN: 46 mg/dL — ABNORMAL HIGH (ref 8–23)
CALCIUM: 8.9 mg/dL (ref 8.9–10.3)
CO2: 33 mmol/L — AB (ref 22–32)
CREATININE: 1.24 mg/dL — AB (ref 0.44–1.00)
Chloride: 92 mmol/L — ABNORMAL LOW (ref 98–111)
GFR calc non Af Amer: 41 mL/min — ABNORMAL LOW (ref >60)
GFR, EST AFRICAN AMERICAN: 47 mL/min — AB (ref >60)
Glucose, Bld: 86 mg/dL (ref 70–99)
POTASSIUM: 3.3 mmol/L — AB (ref 3.5–5.1)
Sodium: 138 mmol/L (ref 135–145)
TOTAL PROTEIN: 6 g/dL — AB (ref 6.5–8.1)
Total Bilirubin: 1.4 mg/dL — ABNORMAL HIGH (ref 0.3–1.2)

## 2017-12-31 LAB — MAGNESIUM: Magnesium: 1.8 mg/dL (ref 1.7–2.4)

## 2017-12-31 MED ORDER — TECHNETIUM TC 99M MEBROFENIN IV KIT
5.0000 | PACK | Freq: Once | INTRAVENOUS | Status: AC | PRN
  Administered 2017-12-31: 5.21 via INTRAVENOUS

## 2017-12-31 MED ORDER — POTASSIUM CHLORIDE CRYS ER 20 MEQ PO TBCR
40.0000 meq | EXTENDED_RELEASE_TABLET | Freq: Once | ORAL | Status: AC
  Administered 2017-12-31: 40 meq via ORAL
  Filled 2017-12-31: qty 2

## 2017-12-31 NOTE — Telephone Encounter (Signed)
Pt returned call. Lab orders were placed in mail for pt.

## 2017-12-31 NOTE — Care Management Important Message (Signed)
Important Message  Patient Details  Name: Karina Tran MRN: [id number] Date of Birth: 1940-12-13   Medicare Important Message Given:  Yes    [name] 12/31/2017, 11:26 AM

## 2017-12-31 NOTE — Progress Notes (Signed)
PROGRESS NOTE    Karina Tran  KZS:[id number] DOB: [birth year] DOA: 12/22/2017 PCP: Curly Rim, MD   Brief Narrative:   77 year old female with a history of persistent atrial fibrillation, hypertension, interstitial lung disease presenting with acute onset of nausea, vomiting, generalized weakness, diaphoresis that began around 2 PM on 12/22/2017. The patient had been in her usual state of health up until 2 PM on 12/22/2017. The patient decided to go take a nap after brunch on 12/22/2017. She woke up to go to the bathroom. At that time, the patient felt lightheaded and dizzy. She subsequently had a loose stool without any hematochezia or melena. When she went back to her bedroom, the patient had generalized weakness with diaphoresis, but she denied any chest discomfort, shortness of breath, visual disturbance, focal extremity weakness. As result, EMS was activated. The patient denied any recent travels, exotic or undercooked foods, or sick contacts. She lives with her daughter and son-in-law and her grandchildren all of whom are in good health. She denies any fevers, chills, abdominal pain, dysuria, hematuria, hematochezia, melena, rashes, synovitis. She has not had any recent travels. She has not had any recent changes in any of her medications. The patient had numerous episodes of dry heaves on 12/22/2017 which continued in the emergency department. In the emergency department, the patient was noted to be afebrile but hypotensive with blood pressure of 77/58. She was saturating 100% on room air. She was noted to have elevated LFTs with AST 97, ALT 51, alk phosphatase 85, total bilirubin 1.1at the time of admission. WBC was 10.2. Lactic acid peaked at 3.9. She was started on vancomycin and Zosyn after blood cultures and urine cultures were obtained. Chest x-ray showed pulmonary fibrosis without any consolidations.   Assessment/Plan: Sepsis -general surgery consulted for  initial concerns for cholecystitis--discussed with Dr. Aviva Signs, unlikely to be related to her gall bladder -blood cultures remain neg -PCT <0.10 -lactic acid peaked 4.2, but has since improved -check cortisol--28.8 -no convincing evidence of infectious process. 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 -12/23/17 RUQ US--GB wall thickening with cholelithiasis, possible GB neck stone -12/24/17 CT abd--GB wall thickening with surrounding fluid--?cholecystitis vs ascites/anasarca -discussed with general surgery and unlikely that rising transaminases is related to her gall bladder -GI consult appreciated -viral hepatitis panel unrevealing -overall transaminases improving  AKI-improving -due to sepsis and hemodynamic changes/hypotension -renal US--no hydronephrosis -Baseline creatinine 0.6-0.8 -Improved with IV fluids  Elevated troponin-resolved -no chest pain -personally reviewed EKG--sinus, nonspecific T wave changes -due to demand ischemia  Acute on chronic combined CHF-Improving with diuresis -patient continues to have evidence of volume overload with significant peripheral edema  -chest xray 9/18 showed persistent CHF -IV Lasix discontinued today. -monitor intake and output -Echo shows EF of 45% with grade 2 DD  Cardiomyopathy -suspect related to Etoh, stress -12/24/17 Echo--EF 45%, diffuse HK, G2DD, PASP 33 -will need cardiology follow up  Persistent atrial fibrillation -heart rate stable on atenolol -Continue flecainide -Anticoagulated with Xarelto  Hyperglycemia -Hemoglobin A1c--5.4  Interstitial Lung Disease -Currently on 2 L nasal cannula  Etoh Dependence -12/24/17 pt revealed she drinks 6 pack of beer on sat and sun -CIWA -no signs of withdrawal at present  Hyponatremia-resolved -due to AKI and defect in Na reabsorption -resolved  Diarrhea-resolved -C. difficile negative. -Treat  supportively with Imodium as needed. -improving  Abdominal pain-resolved -LFTs trending down -no relation to po intake -Lipase is normal. -We will check HIDA scan; biliary dyskinesia noted  with reduced EF  Shortness of breath-improving -Suspect this is multifactorial -She does have possible interstitial edema from volume overload and is receiving diuretics. -She also has a history of interstitial lung disease which may also be contributing. -We will start the patient on prednisone, inhaled corticosteroids and continue on bronchodilators.  Disposition Plan:Discharge home once bowel movements have improved and overall volume status has improved  Family Communication:Discussed with daughter at the bedside  Consultants:GI  Code Status: FULL  DVT Prophylaxis:xarelto   Procedures: As Listed in Progress Note Above  Antibiotics: vanco 9/14>>>9/16 Zosyn 9/14>>>9/18  Subjective: Patient seen and evaluated today with no new acute complaints or concerns. No acute concerns or events noted overnight.  She states that her diarrhea has improved considerably and she has no further abdominal pain.  She is still feeling quite fatigued and continues to have lower extremity edema.  Objective: Vitals:   12/31/17 0524 12/31/17 1025 12/31/17 1036 12/31/17 1042  BP: (!) 173/85   (!) 141/67  Pulse: 69   65  Resp: 16     Temp: 98 F (36.7 C)     TempSrc: Oral     SpO2: 94% 96% 96%   Weight: 82.8 kg     Height:        Intake/Output Summary (Last 24 hours) at 12/31/2017 1333 Last data filed at 12/31/2017 0645 Gross per 24 hour  Intake -  Output 3900 ml  Net -3900 ml   Filed Weights   12/29/17 0500 12/30/17 0716 12/31/17 0524  Weight: 82.9 kg 83 kg 82.8 kg    Examination:  General exam: Appears calm and comfortable  Respiratory system: Clear to auscultation. Respiratory effort normal. Cardiovascular system: S1 & S2 heard, RRR. No JVD, murmurs, rubs, gallops or  clicks. No pedal edema. Gastrointestinal system: Abdomen is nondistended, soft and nontender. No organomegaly or masses felt. Normal bowel sounds heard. Central nervous system: Alert and oriented. No focal neurological deficits. Extremities: Symmetric 5 x 5 power. Skin: No rashes, lesions or ulcers Psychiatry: Judgement and insight appear normal. Mood & affect appropriate.     Data Reviewed: I have personally reviewed following labs and imaging studies  CBC: Recent Labs  Lab 12/25/17 0410 12/26/17 0412  WBC 11.3* 9.6  HGB 12.4 12.3  HCT 35.9* 36.8  MCV 91.8 92.2  PLT 155 124   Basic Metabolic Panel: Recent Labs  Lab 12/26/17 0412 12/27/17 0406 12/28/17 0507 12/29/17 0612 12/30/17 1004 12/31/17 0417 12/31/17 0604  NA 137 137 135 136 137 138  --   K 2.4* 3.5 3.3* 4.0 3.8 3.3*  --   CL 98 98 96* 95* 95* 92*  --   CO2 _0 33*  --   GLUCOSE 100* 205* 166* 139* 154* 86  --   BUN 22 20 28* 36* 42* 46*  --   CREATININE 2.07* 1.69* 1.42* 1.31* 1.33* 1.24*  --   CALCIUM 7.8* 8.3* 8.6* 8.9 9.0 8.9  --   MG 1.4* 2.2  --   --   --   --  1.8   GFR: Estimated Creatinine Clearance: 38.9 mL/min (A) (by C-G formula based on SCr of 1.24 mg/dL (H)). Liver Function Tests: Recent Labs  Lab 12/27/17 0406 12/28/17 0507 12/29/17 0612 12/30/17 1004 12/31/17 0417  AST 715* 265* 114* 63* 53*  ALT 1,497* 1,029* 751* 548* 438*  ALKPHOS 98 84 79 77 74  BILITOT 1.5* 1.6* 1.5* 1.2 1.4*  PROT 5.7* 5.7* 5.8* 5.9* 6.0*  ALBUMIN 2.9* 2.9* 3.0* 3.2* 3.2*   Recent Labs  Lab 12/29/17 0612  LIPASE 28   No results for input(s): AMMONIA in the last 168 hours. Coagulation Profile: Recent Labs  Lab 12/25/17 0846 12/26/17 0412 12/27/17 0406 12/28/17 0507  INR 3.44 1.87 1.43 2.51   Cardiac Enzymes: No results for input(s): CKTOTAL, CKMB, CKMBINDEX, TROPONINI in the last 168 hours. BNP (last 3 results) No results for input(s): PROBNP in the last 8760 hours. HbA1C: No results  for input(s): HGBA1C in the last 72 hours. CBG: Recent Labs  Lab 12/25/17 0409  GLUCAP 109*   Lipid Profile: No results for input(s): CHOL, HDL, LDLCALC, TRIG, CHOLHDL, LDLDIRECT in the last 72 hours. Thyroid Function Tests: No results for input(s): TSH, T4TOTAL, FREET4, T3FREE, THYROIDAB in the last 72 hours. Anemia Panel: No results for input(s): VITAMINB12, FOLATE, FERRITIN, TIBC, IRON, RETICCTPCT in the last 72 hours. Sepsis Labs: No results for input(s): PROCALCITON, LATICACIDVEN in the last 168 hours.  Recent Results (from the past 240 hour(s))  Blood culture (routine x 2)     Status: None   Collection Time: 12/22/17  6:45 PM  Result Value Ref Range Status   Specimen Description RIGHT ANTECUBITAL  Final   Special Requests   Final    BOTTLES DRAWN AEROBIC AND ANAEROBIC Blood Culture adequate volume   Culture   Final    NO GROWTH 5 DAYS Performed at [facility], [address] [place], [place] [address]    Report Status 12/27/2017 FINAL  Final  Blood culture (routine x 2)     Status: None   Collection Time: 12/22/17  7:30 PM  Result Value Ref Range Status   Specimen Description RIGHT ANTECUBITAL  Final   Special Requests   Final    BOTTLES DRAWN AEROBIC ONLY Blood Culture adequate volume   Culture   Final    NO GROWTH 5 DAYS Performed at [facility], [address], [place], [place] [address]    Report Status 12/27/2017 FINAL  Final  Urine culture     Status: None   Collection Time: 12/22/17  9:31 PM  Result Value Ref Range Status   Specimen Description   Final    URINE, RANDOM Performed at [facility], [address] [place], [place] [address]    Special Requests   Final    NONE Performed at [facility], [address], [place], [place] [address]    Culture   Final    NO GROWTH Performed at [facility], [place] [address], [place], [place] [address]    Report Status 12/24/2017 FINAL  Final  MRSA PCR Screening     Status: None   Collection Time:  12/22/17 11:18 PM  Result Value Ref Range Status   MRSA by PCR NEGATIVE NEGATIVE Final    Comment:        The GeneXpert MRSA Assay (FDA approved for NASAL specimens only), is one component of a comprehensive MRSA colonization surveillance program. It is not intended to diagnose MRSA infection nor to guide or monitor treatment for MRSA infections. Performed at [facility], [address] [place], [place] [address]   Culture, blood (x 2)     Status: None   Collection Time: 12/23/17 10:57 PM  Result Value Ref Range Status   Specimen Description BLOOD RIGHT HAND  Final   Special Requests   Final    BOTTLES DRAWN AEROBIC ONLY Blood Culture adequate volume   Culture   Final  NO GROWTH 5 DAYS Performed at [facility], [address], [place], [place] [address]    Report Status 12/28/2017 FINAL  Final  Culture, blood (x 2)     Status: None   Collection Time: 12/23/17 10:57 PM  Result Value Ref Range Status   Specimen Description RIGHT ANTECUBITAL  Final   Special Requests   Final    BOTTLES DRAWN AEROBIC AND ANAEROBIC Blood Culture results may not be optimal due to an inadequate volume of blood received in culture bottles   Culture   Final    NO GROWTH 5 DAYS Performed at [facility], [address] [place], [place] [address]    Report Status 12/28/2017 FINAL  Final  C difficile quick scan w PCR reflex     Status: None   Collection Time: 12/28/17  2:17 PM  Result Value Ref Range Status   C Diff antigen NEGATIVE NEGATIVE Final   C Diff toxin NEGATIVE NEGATIVE Final   C Diff interpretation No C. difficile detected.  Final    Comment: Performed at [facility], [address], [place], [place] [address]         Radiology Studies: Dg Chest 2 View  Result Date: 12/30/2017 CLINICAL DATA:  Short of breath. EXAM: CHEST - 2 VIEW COMPARISON:  12/26/2017 and older exams. FINDINGS: Mild enlargement of the cardiopericardial silhouette. No mediastinal or hilar masses. Persistent  lung base opacity consistent with a combination of small effusions and probable atelectasis. Bilateral interstitial thickening, chronic and stable from older studies. No convincing pneumonia. No pulmonary edema. No pneumothorax. IMPRESSION: 1. No significant change from most recent prior study allowing for differences technique. 2. Small pleural effusions with associated basilar atelectasis. 3. Chronic interstitial thickening consistent with fibrosis. No convincing pneumonia or pulmonary edema. Electronically Signed   By: Lajean Manes M.D.   On: 12/30/2017 14:36   Nm Hepato W/eject Fract  Result Date: 12/31/2017 CLINICAL DATA:  Abdominal pain EXAM: NUCLEAR MEDICINE HEPATOBILIARY IMAGING WITH GALLBLADDER EF VIEWS: Anterior right upper quadrant RADIOPHARMACEUTICALS:  5.21 mCi Tc-50m Choletec IV COMPARISON:  None. FINDINGS: Liver uptake of radiotracer is unremarkable. There is prompt visualization of gallbladder and small bowel, indicating patency of the cystic and common bile ducts. The patient consumed 8 ounces of Ensure orally with calculation of the computer generated ejection fraction of radiotracer from the gallbladder. The patient did not experience clinical symptoms with the oral Ensure consumption. The computer generated ejection fraction of radiotracer from the gallbladder is diminished at 15%, normal greater than 33% using the oral agent. IMPRESSION: Abnormally low ejection fraction of radiotracer from the gallbladder, a finding felt to be indicative of a degree of biliary dyskinesia. Cystic and common bile ducts are patent as is evidenced by visualization of gallbladder and small bowel. Electronically Signed   By: WLowella GripIII M.D.   On: 12/31/2017 10:01        Scheduled Meds: . atenolol  50 mg Oral Daily  . budesonide (PULMICORT) nebulizer solution  0.25 mg Nebulization BID  . flecainide  50 mg Oral Q12H  . furosemide  60 mg Intravenous BID  . guaiFENesin  1,200 mg Oral BID  .  ipratropium-albuterol  3 mL Nebulization TID  . mouth rinse  15 mL Mouth Rinse q12n4p  . pantoprazole  40 mg Oral q morning - 10a  . predniSONE  40 mg Oral Q breakfast  . Rivaroxaban  15 mg Oral Daily   Continuous Infusions: . sodium chloride Stopped (12/26/17  1948)     LOS: 9 days    Time spent: 30 minutes    Taysia Rivere Darleen Crocker, DO [facility] Pager [phone]  If 7PM-7AM, please contact night-coverage [email or web address] Password [email or web address] 12/31/2017, 1:33 PM

## 2017-12-31 NOTE — Telephone Encounter (Signed)
PATIENT SCHEDULED AND LETTER SENT  °

## 2017-12-31 NOTE — Progress Notes (Signed)
Physical Therapy Treatment Patient Details Name: Karina Tran MRN: [id number] DOB: [birth year] Today's Date: 12/31/2017    History of Present Illness Karina Tran is a 77 y.o. female with medical history significant for atrial flutter/fibrillation on Xarelto, ILD, hypertension, prior pancreatitis, and thoracic compression fracture who presented to the emergency department with sudden onset weakness as well as nausea, vomiting, diaphoresis as well as some presyncope that began at approximately 2 PM this afternoon as she was going to go used to bathroom.  She denies any chest pain, palpitations, shortness of breath, fever, chills, diarrhea, or abdominal pain.  She had her daughter called EMS and upon arrival to the ED also developed an ache to her right shoulder.  She states that she has been taking her medications as prescribed and has not had any other symptoms.  She denies any particular aggravating or alleviating factors.    PT Comments    Patient demonstrates good return for sitting up at bedside and sit to stands without use of an AD, had no loss of balance during ambulation in hallway, however, had to frequently lean on side rails in hallway with occasional stopping due to fatigue and mild SOB.  Patient tolerated sitting up at bedside after therapy.     Follow Up Recommendations  No PT follow up     Equipment Recommendations  None recommended by PT    Recommendations for Other Services       Precautions / Restrictions Precautions Precautions: None Restrictions Weight Bearing Restrictions: No    Mobility  Bed Mobility Overal bed mobility: Modified Independent                Transfers Overall transfer level: Modified independent                  Ambulation/Gait Ambulation/Gait assistance: Supervision Gait Distance (Feet): 75 Feet Assistive device: None Gait Pattern/deviations: Decreased step length - right;Decreased step length - left;Decreased  stride length Gait velocity: decreased   General Gait Details: slightly labored cadence with frequent use of side rails in hallway, occasional stopping due to mild SOB, no loss of balance   Stairs             Wheelchair Mobility    Modified Rankin (Stroke Patients Only)       Balance Overall balance assessment: No apparent balance deficits (not formally assessed)                                          Cognition Arousal/Alertness: Awake/alert Behavior During Therapy: WFL for tasks assessed/performed Overall Cognitive Status: Within Functional Limits for tasks assessed                                        Exercises General Exercises - Lower Extremity Long Arc Quad: Seated;AROM;Strengthening;Both;5 reps Hip Flexion/Marching: Seated;AROM;Strengthening;Both;5 reps Toe Raises: Seated;Strengthening;AROM;Both;10 reps Heel Raises: Seated;AROM;Strengthening;Both;10 reps    General Comments        Pertinent Vitals/Pain Pain Assessment: No/denies pain    Home Living                      Prior Function            PT Goals (current goals can now be found in the care plan section)  Acute Rehab PT Goals Patient Stated Goal: return home PT Goal Formulation: With patient Time For Goal Achievement: 01/03/18 Potential to Achieve Goals: Good Progress towards PT goals: Progressing toward goals    Frequency    Min 3X/week      PT Plan Current plan remains appropriate    Co-evaluation              AM-PAC PT "6 Clicks" Daily Activity  Outcome Measure  Difficulty turning over in bed (including adjusting bedclothes, sheets and blankets)?: None Difficulty moving from lying on back to sitting on the side of the bed? : None Difficulty sitting down on and standing up from a chair with arms (e.g., wheelchair, bedside commode, etc,.)?: None Help needed moving to and from a bed to chair (including a wheelchair)?:  None Help needed walking in hospital room?: A Little Help needed climbing 3-5 steps with a railing? : A Little 6 Click Score: 22    End of Session   Activity Tolerance: Patient tolerated treatment well;Patient limited by fatigue Patient left: in bed;with call bell/phone within reach(seated at bedside) Nurse Communication: Mobility status PT Visit Diagnosis: Unsteadiness on feet (R26.81);Other abnormalities of gait and mobility (R26.89);Muscle weakness (generalized) (M62.81)     Time: 0321-2248 PT Time Calculation (min) (ACUTE ONLY): 24 min  Charges:  $Therapeutic Exercise: 8-22 mins $Therapeutic Activity: 8-22 mins                     3:23 PM, 12/31/17 Lonell Grandchild, MPT Physical Therapist with [facility] 336 [phone] office [phone] mobile phone

## 2017-12-31 NOTE — Telephone Encounter (Signed)
Lab orders placed. Lm to discuss with pt.

## 2017-12-31 NOTE — Telephone Encounter (Signed)
Please have patient follow-up in 2 months, with repeat HFP in 1 week post-discharge. History of ischemic hepatopathy.

## 2018-01-01 LAB — COMPREHENSIVE METABOLIC PANEL
ALT: 285 U/L — AB (ref 0–44)
AST: 40 U/L (ref 15–41)
Albumin: 3 g/dL — ABNORMAL LOW (ref 3.5–5.0)
Alkaline Phosphatase: 77 U/L (ref 38–126)
Anion gap: 11 (ref 5–15)
BUN: 41 mg/dL — ABNORMAL HIGH (ref 8–23)
CHLORIDE: 93 mmol/L — AB (ref 98–111)
CO2: 34 mmol/L — AB (ref 22–32)
CREATININE: 1.26 mg/dL — AB (ref 0.44–1.00)
Calcium: 8.5 mg/dL — ABNORMAL LOW (ref 8.9–10.3)
GFR calc Af Amer: 46 mL/min — ABNORMAL LOW (ref >60)
GFR calc non Af Amer: 40 mL/min — ABNORMAL LOW (ref >60)
Glucose, Bld: 131 mg/dL — ABNORMAL HIGH (ref 70–99)
POTASSIUM: 3.1 mmol/L — AB (ref 3.5–5.1)
Sodium: 138 mmol/L (ref 135–145)
Total Bilirubin: 1.5 mg/dL — ABNORMAL HIGH (ref 0.3–1.2)
Total Protein: 5.9 g/dL — ABNORMAL LOW (ref 6.5–8.1)

## 2018-01-01 MED ORDER — POTASSIUM CHLORIDE 10 MEQ/100ML IV SOLN: 10.0000 meq | INTRAVENOUS | Status: DC

## 2018-01-01 MED ORDER — GUAIFENESIN ER 600 MG PO TB12: 1200.0000 mg | ORAL_TABLET | Freq: Two times a day (BID) | ORAL | 0 refills | Status: AC

## 2018-01-01 MED ORDER — GUAIFENESIN ER 600 MG PO TB12: 1200.0000 mg | ORAL_TABLET | Freq: Two times a day (BID) | ORAL | 0 refills | Status: DC

## 2018-01-01 MED ORDER — LOPERAMIDE HCL 2 MG PO CAPS: 2.0000 mg | ORAL_CAPSULE | Freq: Four times a day (QID) | ORAL | 0 refills | Status: DC | PRN

## 2018-01-01 MED ORDER — POTASSIUM CHLORIDE CRYS ER 20 MEQ PO TBCR
40.0000 meq | EXTENDED_RELEASE_TABLET | Freq: Once | ORAL | Status: AC
  Administered 2018-01-01: 40 meq via ORAL
  Filled 2018-01-01: qty 2

## 2018-01-01 NOTE — Progress Notes (Signed)
Patient discharged home with personal belongings. IV removed and site intact. Pt discharged with prescriptions.

## 2018-01-01 NOTE — Discharge Summary (Signed)
Physician Discharge Summary  Karina Tran DXA:[id number] DOB: 1940-07-13 DOA: 12/22/2017  PCP: Curly Rim, MD  Admit date: 12/22/2017  Discharge date: 01/01/2018  Admitted From:Home  Disposition:  Home  Recommendations for Outpatient Follow-up:  1. Follow up with PCP in 1-2 weeks with repeat LFTs and will need to have cardiology follow-up arranged outpatient due to echocardiogram on 9/16 demonstrating EF 45% with diffuse hypokinesis and grade 2 diastolic dysfunction. 2. She will need blood pressure rechecked as amlodipine has been discontinued due to some hypotension here. 3. Follow up with GI Roseanne Kaufman on Nov 20th at [facility]:None  Equipment/Devices:None  Discharge Condition:Stable  CODE STATUS: Full  Diet recommendation: Heart Healthy  Brief/Interim Summary:  Per HPI: 77 year old female with a history of persistent atrial fibrillation, hypertension, interstitial lung disease presenting with acute onset of nausea, vomiting, generalized weakness, diaphoresis that began around 2 PM on 12/22/2017. The patient had been in her usual state of health up until 2 PM on 12/22/2017. The patient decided to go take a nap after brunch on 12/22/2017. She woke up to go to the bathroom. At that time, the patient felt lightheaded and dizzy. She subsequently had a loose stool without any hematochezia or melena. When she went back to her bedroom, the patient had generalized weakness with diaphoresis, but she denied any chest discomfort, shortness of breath, visual disturbance, focal extremity weakness. As result, EMS was activated. The patient denied any recent travels, exotic or undercooked foods, or sick contacts. She lives with her daughter and son-in-law and her grandchildren all of whom are in good health. She denies any fevers, chills, abdominal pain, dysuria, hematuria, hematochezia, melena, rashes, synovitis. She has not had any recent travels. She has not had any  recent changes in any of her medications. The patient had numerous episodes of dry heaves on 12/22/2017 which continued in the emergency department. In the emergency department, the patient was noted to be afebrile but hypotensive with blood pressure of 77/58. She was saturating 100% on room air. She was noted to have elevated LFTs with AST 97, ALT 51, alk phosphatase 85, total bilirubin 1.1at the time of admission. WBC was 10.2. Lactic acid peaked at 3.9. She was started on vancomycin and Zosyn after blood cultures and urine cultures were obtained. Chest x-ray showed pulmonary fibrosis without any consolidations.   She was admitted for hypotension suspected to be secondary to sepsis, but there was no convincing evidence of an infectious process and therefore, antibiotics were discontinued on 9/18.  She was noted to have a significant transaminitis was related to ischemic hepatitis for which GI and general surgery followed patient.  She was also noted to have some AKI as well as acute on chronic combined CHF for which she was given some IV fluids and then subsequently was diuresed.  She is now maintaining adequate blood pressure readings and her liver enzymes have down trended.  She is noted to have echocardiogram of EF 45% and diffuse hypokinesis for which she will require cardiology follow-up in the near future.  She also developed some diarrhea while here with C. difficile studies negative.  This has now resolved as well.  She will need outpatient follow-up with GI as scheduled in November along with LFTs performed in 1 week for monitoring.  Additionally, she will require cardiology follow-up in the outpatient setting based on her echocardiogram results.  Discharge Diagnoses:  Principal Problem:   Hypotension, unspecified Active Problems:   Atrial flutter ([place])  Essential hypertension   Thoracic compression fracture (HCC)   GERD (gastroesophageal reflux disease)   ILD (interstitial lung  disease) (HCC)   Acute lower UTI   Nausea & vomiting   AKI (acute kidney injury) ([place])   Hypotension   Transaminasemia   Sepsis due to undetermined organism ([place])   Calculus of gallbladder without cholecystitis without obstruction   Elevated LFTs   Coagulopathy [facility])    Discharge Instructions  Discharge Instructions    Diet - low sodium heart healthy   Complete by:  As directed    Increase activity slowly   Complete by:  As directed      Allergies as of 01/01/2018      Reactions   Aspirin Other (See Comments)   Bloody noses   Blue Dyes (parenteral) Nausea And Vomiting   Demerol [meperidine] Nausea And Vomiting      Medication List    STOP taking these medications   amLODipine 5 MG tablet Commonly known as:  NORVASC     TAKE these medications   atenolol 50 MG tablet Commonly known as:  TENORMIN Take 50 mg by mouth daily.   CALCIUM 600+D3 PO Take 1 tablet by mouth every morning.   Co Q10 100 MG Caps Take 100 mg by mouth every morning.   flecainide 50 MG tablet Commonly known as:  TAMBOCOR Take 1 tablet (50 mg total) by mouth every 12 (twelve) hours. What changed:    how much to take  when to take this   gabapentin 300 MG capsule Commonly known as:  NEURONTIN Take 300 mg by mouth at bedtime.   guaiFENesin 600 MG 12 hr tablet Commonly known as:  MUCINEX Take 2 tablets (1,200 mg total) by mouth 2 (two) times daily for 7 days.   hydrochlorothiazide 25 MG tablet Commonly known as:  HYDRODIURIL Take 25 mg by mouth daily.   loperamide 2 MG capsule Commonly known as:  IMODIUM Take 1 capsule (2 mg total) by mouth every 6 (six) hours as needed for diarrhea or loose stools.   pantoprazole 40 MG tablet Commonly known as:  PROTONIX Take 40 mg by mouth every morning.   potassium gluconate 595 (99 K) MG Tabs tablet Take 595 mg by mouth daily.   rivaroxaban 20 MG Tabs tablet Commonly known as:  XARELTO Take 1 tablet (20 mg total) by mouth daily with  supper. What changed:  when to take this   vitamin E 400 UNIT capsule Take 400 Units by mouth every morning.      Follow-up Information    Corrington, Kip A, MD Follow up in 1 week(s).   Specialty:  Family Medicine Why:  needs CMP in 1 week Contact information: [address] [place] [address] [phone]          Allergies  Allergen Reactions  . Aspirin Other (See Comments)    Bloody noses  . Blue Dyes (Parenteral) Nausea And Vomiting  . Demerol [Meperidine] Nausea And Vomiting    Consultations:  GI  General surgery   Procedures/Studies: Ct Abdomen Pelvis Wo Contrast  Result Date: 12/24/2017 CLINICAL DATA:  Cirrhosis fatty liver nausea vomiting and loose stools EXAM: CT ABDOMEN AND PELVIS WITHOUT CONTRAST TECHNIQUE: Multidetector CT imaging of the abdomen and pelvis was performed following the standard protocol without IV contrast. COMPARISON:  Ultrasound 12/23/2017, 03/09/2015 CT FINDINGS: Lower chest: Lung bases demonstrate small bilateral pleural effusions. Bibasilar atelectasis. Septal thickening suggesting mild edema. Postsurgical changes of the left breast.  Cardiomegaly. Hepatobiliary: No focal hepatic abnormality. No biliary dilatation. Slight increased intraluminal density at the gallbladder. Wall thickening and surrounding fluid. Pancreas: Unremarkable. No pancreatic ductal dilatation or surrounding inflammatory changes. Spleen: Normal in size without focal abnormality. Adrenals/Urinary Tract: Adrenal glands are normal. No hydronephrosis. Perinephric fat stranding. Bladder contains a Foley catheter Stomach/Bowel: Stomach nonenlarged. No dilated small bowel. No colon wall thickening. Appendix not clearly identified. Vascular/Lymphatic: Moderate aortic atherosclerosis without aneurysm. No significantly enlarged lymph nodes. Reproductive: Uterus and bilateral adnexa are unremarkable. Other: No free air. Small amount of ascites within the abdomen and pelvis.  Diffuse body wall edema consistent with anasarca. Musculoskeletal: Scoliosis of the spine. Treated compression deformity at T11. Partial fusion L1-L2. Degenerative changes of the lumbar spine. Left hip replacement. IMPRESSION: 1. Negative for bowel obstruction or bowel wall thickening. 2. Gallbladder wall thickening with surrounding edema and or fluid, corresponding to thickened wall noted on sonography. Findings are nonspecific and could be seen in the setting of liver disease, acute or chronic cholecystitis, and edema forming states. If acute gallbladder disease remains a concern, further evaluation with hepatobiliary nuclear medicine imaging could be obtained. 3. Anasarca with small amount of ascites in the abdomen and pelvis. 4. Small pleural effusions and bibasilar atelectasis. Septal thickening at the bases suggesting edema. Electronically Signed   By: Donavan Foil M.D.   On: 12/24/2017 00:22   Dg Chest 2 View  Result Date: 12/30/2017 CLINICAL DATA:  Short of breath. EXAM: CHEST - 2 VIEW COMPARISON:  12/26/2017 and older exams. FINDINGS: Mild enlargement of the cardiopericardial silhouette. No mediastinal or hilar masses. Persistent lung base opacity consistent with a combination of small effusions and probable atelectasis. Bilateral interstitial thickening, chronic and stable from older studies. No convincing pneumonia. No pulmonary edema. No pneumothorax. IMPRESSION: 1. No significant change from most recent prior study allowing for differences technique. 2. Small pleural effusions with associated basilar atelectasis. 3. Chronic interstitial thickening consistent with fibrosis. No convincing pneumonia or pulmonary edema. Electronically Signed   By: Lajean Manes M.D.   On: 12/30/2017 14:36   Dg Chest 2 View  Result Date: 12/22/2017 CLINICAL DATA:  77 y/o  F; chest pain with intermittent nausea. EXAM: CHEST - 2 VIEW COMPARISON:  03/28/2016 CT chest.  11/16/2014 chest radiograph. FINDINGS: Mild  cardiomegaly. Calcific aortic atherosclerosis. Coarse reticular opacities greatest at the lung bases compatible pulmonary fibrosis. No consolidation, effusion, or pneumothorax. T12 chronic compression deformity. Bones are otherwise unremarkable. IMPRESSION: 1. Mild cardiomegaly. 2. Basilar predominant pulmonary fibrosis.  No focal consolidation. 3.  Aortic Atherosclerosis (ICD10-I70.0). 4. Chronic T12 compression deformity. Electronically Signed   By: Kristine Garbe M.D.   On: 12/22/2017 19:34   Ct Head Wo Contrast  Result Date: 12/23/2017 CLINICAL DATA:  Acute confusion. EXAM: CT HEAD WITHOUT CONTRAST TECHNIQUE: Contiguous axial images were obtained from the base of the skull through the vertex without intravenous contrast. COMPARISON:  None. FINDINGS: Brain: Generalized atrophy. No sign of acute infarction. 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 Chronic small-vessel changes of the deep white matter. No mass lesion, hemorrhage, hydrocephalus or extra-axial collection. Vascular: There is atherosclerotic calcification of the major vessels at the base of the brain. Skull: Negative Sinuses/Orbits: Clear/normal Other: None IMPRESSION: No acute finding by CT. Atrophy and chronic small-vessel ischemic change. 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 Electronically Signed   By: Nelson Chimes M.D.   On: 12/23/2017 19:59   US Renal  Result Date: 12/24/2017 CLINICAL DATA:  Acute renal  insufficiency EXAM: RENAL ULTRASOUND COMPARISON:  CT abdomen and pelvis December 23, 2017 FINDINGS: Right Kidney: Length: 10.9 cm. Echogenicity and renal cortical thickness are within normal limits. No mass, perinephric fluid, or hydronephrosis visualized. No sonographically demonstrable calculus or ureterectasis. Left Kidney: Length: 10.7 cm. Echogenicity and renal cortical thickness are within normal limits. No perinephric fluid or hydronephrosis visualized. There is a cyst  arising from the lower pole left kidney measuring 1.7 x 1.7 x 1.0 cm. No sonographically demonstrable calculus or ureterectasis. Bladder: Decompressed with Foley catheter and cannot be assessed. There is ascites.  There is a left pleural effusion. IMPRESSION: Small left renal cyst. Kidneys otherwise appear unremarkable bilaterally. Urinary bladder is decompressed and cannot be assessed at this time. There is ascites as well as a left pleural effusion evident. Electronically Signed   By: Lowella Grip III M.D.   On: 12/24/2017 10:41   Nm Hepato W/eject Fract  Result Date: 12/31/2017 CLINICAL DATA:  Abdominal pain EXAM: NUCLEAR MEDICINE HEPATOBILIARY IMAGING WITH GALLBLADDER EF VIEWS: Anterior right upper quadrant RADIOPHARMACEUTICALS:  5.21 mCi Tc-1m Choletec IV COMPARISON:  None. FINDINGS: Liver uptake of radiotracer is unremarkable. There is prompt visualization of gallbladder and small bowel, indicating patency of the cystic and common bile ducts. The patient consumed 8 ounces of Ensure orally with calculation of the computer generated ejection fraction of radiotracer from the gallbladder. The patient did not experience clinical symptoms with the oral Ensure consumption. The computer generated ejection fraction of radiotracer from the gallbladder is diminished at 15%, normal greater than 33% using the oral agent. IMPRESSION: Abnormally low ejection fraction of radiotracer from the gallbladder, a finding felt to be indicative of a degree of biliary dyskinesia. Cystic and common bile ducts are patent as is evidenced by visualization of gallbladder and small bowel. Electronically Signed   By: WLowella GripIII M.D.   On: 12/31/2017 10:01   Dg Chest Port 1 View  Result Date: 12/26/2017 CLINICAL DATA:  Wheezing and cough and pleuritic abdominal pain. History of atrial fibrillation, interstitial lung disease, breast malignancy. EXAM: PORTABLE CHEST 1 VIEW COMPARISON:  Portable chest x-ray of December 23, 2017 FINDINGS: The lungs are mildly hypoinflated. The cardiac silhouette is enlarged. The pulmonary vascularity is engorged. There small bilateral pleural effusions. There is calcification in the wall of the aortic arch. The bony thorax exhibits no acute abnormality. IMPRESSION: CHF with mild interstitial edema and small bilateral pleural effusions. The appearance of the chest has deteriorated slightly since the study of 3 days ago. Thoracic aortic atherosclerosis. Electronically Signed   By: David  JMartiniqueM.D.   On: 12/26/2017 15:43   Dg Chest Port 1 View  Result Date: 12/23/2017 CLINICAL DATA:  Dyspnea and confusion EXAM: PORTABLE CHEST 1 VIEW COMPARISON:  12/22/2017, 02/04/2015, CT chest 03/28/2016 FINDINGS: Cardiomegaly with vascular congestion, small pleural effusion and interstitial edema. Confluent opacity at the left base. Enlarged central pulmonary vessels. Aortic atherosclerosis. No pneumothorax. IMPRESSION: 1. Cardiomegaly with vascular congestion, small pleural effusion and mild increased interstitial opacity consistent with edema 2. Patchy consolidation at the left base may reflect atelectasis or superimposed pneumonia Electronically Signed   By: KDonavan FoilM.D.   On: 12/23/2017 19:32   UKoreaAbdomen Limited Ruq  Result Date: 12/23/2017 CLINICAL DATA:  Nausea and vomiting for 1 day EXAM: ULTRASOUND ABDOMEN LIMITED RIGHT UPPER QUADRANT COMPARISON:  Abdominal CT 03/09/2015 FINDINGS: Gallbladder: Shadowing gallstone in the neck. There is generalized gallbladder wall thickening to 10 mm (this measurement includes pericholecystic  fat) without focal tenderness or wall striation. Gallbladder is incompletely distended. Common bile duct: Diameter: 4 mm Liver: No focal lesion identified. Within normal limits in parenchymal echogenicity. Portal vein is patent on color Doppler imaging with normal direction of blood flow towards the liver. Trace right pleural effusion IMPRESSION: Cholelithiasis. There is  gallbladder wall thickening but this may be related to under distention of the gallbladder. No focal tenderness as would be expected for acute cholecystitis. Electronically Signed   By: Monte Fantasia M.D.   On: 12/23/2017 11:09     Discharge Exam: Vitals:   01/01/18 0726 01/01/18 0731  BP:    Pulse:    Resp:    Temp:    SpO2: 98% 98%   Vitals:   12/31/17 2110 01/01/18 0553 01/01/18 0726 01/01/18 0731  BP: (!) 124/51 (!) 132/59    Pulse: 73 75    Resp: 16 18    Temp: 98 F (36.7 C) 97.8 F (36.6 C)    TempSrc: Oral Oral    SpO2: 100% 100% 98% 98%  Weight:  80 kg    Height:        General: Pt is alert, awake, not in acute distress Cardiovascular: RRR, S1/S2 +, no rubs, no gallops Respiratory: CTA bilaterally, no wheezing, no rhonchi Abdominal: Soft, NT, ND, bowel sounds + Extremities: no edema, no cyanosis    The results of significant diagnostics from this hospitalization (including imaging, microbiology, ancillary and laboratory) are listed below for reference.     Microbiology: Recent Results (from the past 240 hour(s))  Blood culture (routine x 2)     Status: None   Collection Time: 12/22/17  6:45 PM  Result Value Ref Range Status   Specimen Description RIGHT ANTECUBITAL  Final   Special Requests   Final    BOTTLES DRAWN AEROBIC AND ANAEROBIC Blood Culture adequate volume   Culture   Final    NO GROWTH 5 DAYS Performed at [facility], [address] [place], [place] [address]    Report Status 12/27/2017 FINAL  Final  Blood culture (routine x 2)     Status: None   Collection Time: 12/22/17  7:30 PM  Result Value Ref Range Status   Specimen Description RIGHT ANTECUBITAL  Final   Special Requests   Final    BOTTLES DRAWN AEROBIC ONLY Blood Culture adequate volume   Culture   Final    NO GROWTH 5 DAYS Performed at [facility], [address] [place], [place] [address]    Report Status 12/27/2017 FINAL  Final  Urine culture     Status: None   Collection  Time: 12/22/17  9:31 PM  Result Value Ref Range Status   Specimen Description   Final    URINE, RANDOM Performed at [facility], [address] [place], [place] [address]    Special Requests   Final    NONE Performed at [facility], [address] [place], [place] [address]    Culture   Final    NO GROWTH Performed at [facility], [place] [address] [place], [place] [address]    Report Status 12/24/2017 FINAL  Final  MRSA PCR Screening     Status: None   Collection Time: 12/22/17 11:18 PM  Result Value Ref Range Status   MRSA by PCR NEGATIVE NEGATIVE Final    Comment:        The GeneXpert MRSA Assay (FDA approved for NASAL specimens only), is one component of a  comprehensive MRSA colonization surveillance program. It is not intended to diagnose MRSA infection nor to guide or monitor treatment for MRSA infections. Performed at [facility], [address], [place], [place] [address]   Culture, blood (x 2)     Status: None   Collection Time: 12/23/17 10:57 PM  Result Value Ref Range Status   Specimen Description BLOOD RIGHT HAND  Final   Special Requests   Final    BOTTLES DRAWN AEROBIC ONLY Blood Culture adequate volume   Culture   Final    NO GROWTH 5 DAYS Performed at [facility], [address] [place], [place] [address]    Report Status 12/28/2017 FINAL  Final  Culture, blood (x 2)     Status: None   Collection Time: 12/23/17 10:57 PM  Result Value Ref Range Status   Specimen Description RIGHT ANTECUBITAL  Final   Special Requests   Final    BOTTLES DRAWN AEROBIC AND ANAEROBIC Blood Culture results may not be optimal due to an inadequate volume of blood received in culture bottles   Culture   Final    NO GROWTH 5 DAYS Performed at [facility], [address] [place], [place] [address]    Report Status 12/28/2017 FINAL  Final  C difficile quick scan w PCR reflex     Status: None   Collection Time: 12/28/17  2:17 PM  Result Value Ref Range Status   C  Diff antigen NEGATIVE NEGATIVE Final   C Diff toxin NEGATIVE NEGATIVE Final   C Diff interpretation No C. difficile detected.  Final    Comment: Performed at [facility], [address], [place],  [address]     Labs: BNP (last 3 results) No results for input(s): BNP in the last 8760 hours. Basic Metabolic Panel: Recent Labs  Lab 12/26/17 0412 12/27/17 0406 12/28/17 0507 12/29/17 0612 12/30/17 1004 12/31/17 0417 12/31/17 0604 01/01/18 0524  NA 137 137 135 136 137 138  --  138  K 2.4* 3.5 3.3* 4.0 3.8 3.3*  --  3.1*  CL 98 98 96* 95* 95* 92*  --  93*  CO2 '25 28 28 29 31 '$ 33*  --  34*  GLUCOSE 100* 205* 166* 139* 154* 86  --  131*  BUN 22 20 28* 36* 42* 46*  --  41*  CREATININE 2.07* 1.69* 1.42* 1.31* 1.33* 1.24*  --  1.26*  CALCIUM 7.8* 8.3* 8.6* 8.9 9.0 8.9  --  8.5*  MG 1.4* 2.2  --   --   --   --  1.8  --    Liver Function Tests: Recent Labs  Lab 12/28/17 0507 12/29/17 0612 12/30/17 1004 12/31/17 0417 01/01/18 0524  AST 265* 114* 63* 53* 40  ALT 1,029* 751* 548* 438* 285*  ALKPHOS 84 79 77 74 77  BILITOT 1.6* 1.5* 1.2 1.4* 1.5*  PROT 5.7* 5.8* 5.9* 6.0* 5.9*  ALBUMIN 2.9* 3.0* 3.2* 3.2* 3.0*   Recent Labs  Lab 12/29/17 0612  LIPASE 28   No results for input(s): AMMONIA in the last 168 hours. CBC: Recent Labs  Lab 12/26/17 0412  WBC 9.6  HGB 12.3  HCT 36.8  MCV 92.2  PLT 162   Cardiac Enzymes: No results for input(s): CKTOTAL, CKMB, CKMBINDEX, TROPONINI in the last 168 hours. BNP: Invalid input(s): POCBNP CBG: No results for input(s): GLUCAP in the last 168 hours. D-Dimer No results for input(s): DDIMER in the last 72 hours. Hgb A1c No results for input(s):  HGBA1C in the last 72 hours. Lipid Profile No results for input(s): CHOL, HDL, LDLCALC, TRIG, CHOLHDL, LDLDIRECT in the last 72 hours. Thyroid function studies No results for input(s): TSH, T4TOTAL, T3FREE, THYROIDAB in the last 72 hours.  Invalid input(s): FREET3 Anemia work  up No results for input(s): VITAMINB12, FOLATE, FERRITIN, TIBC, IRON, RETICCTPCT in the last 72 hours. Urinalysis    Component Value Date/Time   COLORURINE AMBER (A) 12/22/2017 1838   APPEARANCEUR CLOUDY (A) 12/22/2017 1838   LABSPEC 1.017 12/22/2017 1838   PHURINE 5.0 12/22/2017 1838   GLUCOSEU 50 (A) 12/22/2017 1838   HGBUR NEGATIVE 12/22/2017 1838   BILIRUBINUR NEGATIVE 12/22/2017 1838   KETONESUR NEGATIVE 12/22/2017 1838   PROTEINUR >=300 (A) 12/22/2017 1838   UROBILINOGEN 1.0 02/04/2015 1533   NITRITE NEGATIVE 12/22/2017 1838   LEUKOCYTESUR NEGATIVE 12/22/2017 1838   Sepsis Labs Invalid input(s): PROCALCITONIN,  WBC,  LACTICIDVEN Microbiology Recent Results (from the past 240 hour(s))  Blood culture (routine x 2)     Status: None   Collection Time: 12/22/17  6:45 PM  Result Value Ref Range Status   Specimen Description RIGHT ANTECUBITAL  Final   Special Requests   Final    BOTTLES DRAWN AEROBIC AND ANAEROBIC Blood Culture adequate volume   Culture   Final    NO GROWTH 5 DAYS Performed at [facility], [address], [place], [place] [address]    Report Status 12/27/2017 FINAL  Final  Blood culture (routine x 2)     Status: None   Collection Time: 12/22/17  7:30 PM  Result Value Ref Range Status   Specimen Description RIGHT ANTECUBITAL  Final   Special Requests   Final    BOTTLES DRAWN AEROBIC ONLY Blood Culture adequate volume   Culture   Final    NO GROWTH 5 DAYS Performed at [facility], [address], [place], [place] [address]    Report Status 12/27/2017 FINAL  Final  Urine culture     Status: None   Collection Time: 12/22/17  9:31 PM  Result Value Ref Range Status   Specimen Description   Final    URINE, RANDOM Performed at [facility], [address] [place], [place] [address]    Special Requests   Final    NONE Performed at [facility], [address] [place], [place] [address]    Culture   Final    NO GROWTH Performed at [place], [place] [address] [place], [place] [address]    Report Status 12/24/2017 FINAL  Final  MRSA PCR Screening     Status: None   Collection Time: 12/22/17 11:18 PM  Result Value Ref Range Status   MRSA by PCR NEGATIVE NEGATIVE Final    Comment:        The GeneXpert MRSA Assay (FDA approved for NASAL specimens only), is one component of a comprehensive MRSA colonization surveillance program. It is not intended to diagnose MRSA infection nor to guide or monitor treatment for MRSA infections. Performed at [facility], [address], [place], [place] [address]   Culture, blood (x 2)     Status: None   Collection Time: 12/23/17 10:57 PM  Result Value Ref Range Status   Specimen Description BLOOD RIGHT HAND  Final   Special Requests   Final    BOTTLES DRAWN AEROBIC ONLY Blood Culture adequate volume   Culture   Final    NO GROWTH 5 DAYS Performed at [facility], [address], [place], [place] [address]    Report Status 12/28/2017 FINAL  Final  Culture, blood (x 2)     Status: None   Collection Time: 12/23/17 10:57 PM  Result Value Ref Range Status   Specimen Description RIGHT ANTECUBITAL  Final   Special Requests   Final    BOTTLES DRAWN AEROBIC AND ANAEROBIC Blood Culture results may not be optimal due to an inadequate volume of blood received in culture bottles   Culture   Final    NO GROWTH 5 DAYS Performed at [facility], [address], [place], [place] [address]    Report Status 12/28/2017 FINAL  Final  C difficile quick scan w PCR reflex     Status: None   Collection Time: 12/28/17  2:17 PM  Result Value Ref Range Status   C Diff antigen NEGATIVE NEGATIVE Final   C Diff toxin NEGATIVE NEGATIVE Final   C Diff interpretation No C. difficile detected.  Final    Comment: Performed at [facility], [address], [place], [place] [address]     Time coordinating discharge: 35 minutes  SIGNED:   Rodena Goldmann, DO [facility] 01/01/2018, 9:37 AM Pager  [phone]  If 7PM-7AM, please contact night-coverage [email or web address] Password [email or web address]

## 2018-02-05 ENCOUNTER — Inpatient Hospital Stay: Admit: 2018-02-05 | Payer: Medicare Other | Admitting: Orthopedic Surgery

## 2018-02-05 SURGERY — ARTHROPLASTY, HIP, TOTAL, ANTERIOR APPROACH
Anesthesia: Spinal | Site: Hip | Laterality: Right

## 2018-02-27 ENCOUNTER — Ambulatory Visit (INDEPENDENT_AMBULATORY_CARE_PROVIDER_SITE_OTHER): Payer: Medicare Other | Admitting: Gastroenterology

## 2018-02-27 ENCOUNTER — Encounter: Payer: Self-pay | Admitting: Gastroenterology

## 2018-02-27 DIAGNOSIS — K759 Inflammatory liver disease, unspecified: Secondary | ICD-10-CM

## 2018-02-27 DIAGNOSIS — K7200 Acute and subacute hepatic failure without coma: Secondary | ICD-10-CM

## 2018-02-27 NOTE — Patient Instructions (Signed)
I am glad to see the liver numbers have completely normalized!  We will see you back as needed.  Please call if you have any questions!  It was a pleasure to see you today. I strive to create trusting relationships with patients to provide genuine, compassionate, and quality care. I value your feedback. If you receive a survey regarding your visit,  I greatly appreciate you taking time to fill this out.   Annitta Needs, PhD, ANP-BC [facility]

## 2018-02-27 NOTE — Assessment & Plan Note (Signed)
During admission Sept 2019. LFTs have completely normalized. Absolutely no GI signs/symptoms of concern. Will see her back on an as needed basis.

## 2018-02-27 NOTE — Progress Notes (Signed)
Referring Provider: Curly Rim, MD Primary Care Physician:  Curly Rim, MD  Primary GI: Dr. Gala Romney   Chief Complaint  Patient presents with  . hepatopathy    HPI:   Karina Tran is a 77 y.o. female presenting today with a history of ischemic hepatitis, hospitalized in Sept 2019.   She is doing well today. No abdominal pain, N/V, dysphagia, changes in bowel habits, weight loss, lack of appetite. Recent labs completed Nov 12 with completely normal LFTs (Tbili 0.6, Alk Phos 85, AST 24, ALT 12). No complaints.   Past Medical History:  Diagnosis Date  . Allergic rhinitis   . Breast cancer ([place])       . Dyspnea    chronic with exertion   . GERD (gastroesophageal reflux disease)   . 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 History of GI bleed    last episode 02-02-16; was due to hemorroids   . History of kidney infection   . History of pulmonary hypertension    mild ; (see ECHO result in LOV with Dr. Kela Millin 06-18-17 on chart )  . HLD (hyperlipidemia)    denies   . Hypertension   . ILD (interstitial lung disease) ([place])   . Iron deficiency anemia    gets iv infusions  . Persistent atrial fibrillation    a. s/p PVI 2009 in [place]  . Raynaud phenomenon    no issues currently   . Spinal stenosis    lumbar     Past Surgical History:  Procedure Laterality Date  . ABLATION  2009   [place]I in [place]  . afib ablation  10/06/2010  . APPENDECTOMY    . BREAST LUMPECTOMY Left 2007   malignant  . BREAST SURGERY Left 2007   partial mastectomy and lumpectomy   . CARDIOVERSION N/A 11/18/2014   Procedure: CARDIOVERSION;  Surgeon: Adrian Prows, MD;  Location: [facility];  Service: Cardiovascular;  Laterality: N/A;  . carpel tunnel    . CESAREAN SECTION    . GANGLION CYST EXCISION    . HEMORRHOID SURGERY    . KYPHOPLASTY  2017  . TONSILLECTOMY    . TOTAL HIP ARTHROPLASTY Left 07/03/2017   Procedure: LEFT TOTAL HIP ARTHROPLASTY ANTERIOR APPROACH;  Surgeon: Paralee Cancel, MD;   Location: [facility];  Service: Orthopedics;  Laterality: Left;  70 mins    Current Outpatient Medications  Medication Sig Dispense Refill  . atenolol (TENORMIN) 50 MG tablet Take 50 mg by mouth daily.    . Calcium Carb-Cholecalciferol (CALCIUM 600+D3 PO) Take 1 tablet by mouth every morning.     . Coenzyme Q10 (CO Q10) 100 MG CAPS Take 100 mg by mouth every morning.     . flecainide (TAMBOCOR) 50 MG tablet Take 1 tablet (50 mg total) by mouth every 12 (twelve) hours. (Patient taking differently: Take 100 mg by mouth every morning. ) 60 tablet 0  . hydrochlorothiazide (HYDRODIURIL) 25 MG tablet Take 25 mg by mouth daily.    Marland Kitchen loperamide (IMODIUM) 2 MG capsule Take 1 capsule (2 mg total) by mouth every 6 (six) hours as needed for diarrhea or loose stools. 30 capsule 0  . pantoprazole (PROTONIX) 40 MG tablet Take 40 mg by mouth every morning.     . potassium gluconate 595 (99 K) MG TABS tablet Take 595 mg by mouth daily.    . rivaroxaban (XARELTO) 20 MG TABS tablet Take 1 tablet (20 mg total) by mouth daily with supper. (Patient  taking differently: Take 20 mg by mouth every morning. ) 30 tablet 0  . vitamin E 400 UNIT capsule Take 400 Units by mouth every morning.      No current facility-administered medications for this visit.     Allergies as of 02/27/2018 - Review Complete 02/27/2018  Allergen Reaction Noted  . Aspirin Other (See Comments) 06/25/2014  . Blue dyes (parenteral) Nausea And Vomiting 07/03/2017  . Demerol [meperidine] Nausea And Vomiting 06/25/2014    Family History  Problem Relation Age of Onset  . Lung cancer Sister   . Heart failure Mother   . Brain cancer Father   . Lung disease Neg Hx   . Rheumatologic disease Neg Hx   . Colon cancer Neg Hx   . Colon polyps Neg Hx     Social History   Socioeconomic History  . Marital status: Widowed    Spouse name: Not on file  . Number of children: Not on file  . Years of education: Not on file  . Highest education level:  Not on file  Occupational History  . Not on file  Social Needs  . Financial resource strain: Not on file  . Food insecurity:    Worry: Not on file    Inability: Not on file  . Transportation needs:    Medical: Not on file    Non-medical: Not on file  Tobacco Use  . Smoking status: Passive Smoke Exposure - Never Smoker  . Smokeless tobacco: Never Used  . Tobacco comment: exposed to second-hand smoke her entire life  Substance and Sexual Activity  . Alcohol use: Yes    Comment: social ETOH, beer   . Drug use: No  . Sexual activity: Not on file  Lifestyle  . Physical activity:    Days per week: Not on file    Minutes per session: Not on file  . Stress: Not on file  Relationships  . Social connections:    Talks on phone: Not on file    Gets together: Not on file    Attends religious service: Not on file    Active member of club or organization: Not on file    Attends meetings of clubs or organizations: Not on file    Relationship status: Not on file  Other Topics Concern  . Not on file  Social History Narrative   Live with daughter. Retired.       [facility]:   Originally from [place]. Moved to [place] in 2015. Living with daughter & son-in-law. Remote travel to [place]. 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 No pets currently. Remote parakeet exposure. No mold exposure. Previously enjoyed square dancing and now plays on her computer. Also previously enjoyed camping in her camper but has since sold it.     Review of Systems: Gen: Denies fever, chills, anorexia. Denies fatigue, weakness, weight loss.  CV: Denies chest pain, palpitations, syncope, peripheral edema, and claudication. Resp: Denies dyspnea at rest, cough, wheezing, coughing up blood, and pleurisy. GI: see HPI  Derm: Denies rash, itching, dry skin Psych: Denies depression, anxiety, memory loss, confusion. No homicidal or suicidal  ideation.  Heme: Denies bruising, bleeding, and enlarged lymph nodes.  Physical Exam: BP 133/79   Pulse 79   Temp 97.7 F (36.5 C) (Oral)   Ht '5\' 4"'$  (1.626 m)   Wt 149 lb 3.2 oz (67.7 kg)   BMI 25.61 kg/m  General:   Alert and  oriented. No distress noted. Pleasant and cooperative.  Head:  Normocephalic and atraumatic. Eyes:  Conjuctiva clear without scleral icterus. Mouth:  Oral mucosa pink and moist.  Abdomen:  +BS, soft, non-tender and non-distended. No rebound or guarding. No HSM or masses noted. Msk:  Symmetrical without gross deformities. Normal posture. Extremities:  Without edema. Neurologic:  Alert and  oriented x4 Psych:  Alert and cooperative. Normal mood and affect.

## 2018-02-28 NOTE — Progress Notes (Signed)
CC'D TO PCP °

## 2018-04-22 NOTE — H&P (Signed)
TOTAL HIP ADMISSION H&P  Patient is admitted for right total hip arthroplasty, anterior approach.  Subjective:  Chief Complaint:   Right hip primary OA / pain  HPI: Karina Tran, 78 y.o. female, has a history of pain and functional disability in the right hip(s) due to arthritis and patient has failed non-surgical conservative treatments for greater than 12 weeks to include NSAID's and/or analgesics, use of assistive devices and activity modification.  Onset of symptoms was abrupt starting 1+ years ago with gradually worsening course since that time.The patient noted prior procedures of the hip to include arthroplasty on the left hip per Dr. Alvan Dame 07/03/17.  Patient currently rates pain in the right hip at 5 out of 10 with activity. Patient has night pain, worsening of pain with activity and weight bearing, trendelenberg gait, pain that interfers with activities of daily living and pain with passive range of motion. Patient has evidence of periarticular osteophytes and joint space narrowing by imaging studies. This condition presents safety issues increasing the risk of falls.  There is no current active infection.  Risks, benefits and expectations were discussed with the patient.  Risks including but not limited to the risk of anesthesia, blood clots, nerve damage, blood vessel damage, failure of the prosthesis, infection and up to and including death.  Patient understand the risks, benefits and expectations and wishes to proceed with surgery.   PCP: Corrington, Kip A, MD  D/C Plans:       Home  Post-op Meds:       No Rx given   Tranexamic Acid:      To be given - IV   Decadron:      Is to be given  FYI:      Xarelto   Norco  DME:   Pt already has equipment   PT:    No PT     Patient Active Problem List   Diagnosis Date Noted  . Ischemic hepatitis 02/27/2018  . Coagulopathy ([place]) 12/25/2017  . Elevated LFTs   . Sepsis due to undetermined organism ([place]) 12/24/2017  . Calculus of  gallbladder without cholecystitis without obstruction   . Transaminasemia 12/23/2017  . Sepsis secondary to UTI ([place]) 12/22/2017  . Hypotension, unspecified 12/22/2017  . Acute lower UTI 12/22/2017  . Nausea & vomiting 12/22/2017  . AKI (acute kidney injury) ([place]) 12/22/2017  . Hypotension 12/22/2017  . Overweight (BMI 25.0-29.9) 07/04/2017  . S/P left THA, AA 07/03/2017  . S/P hip replacement 07/03/2017  . ILD (interstitial lung disease) ([place]) 05/17/2016  . Asbestos exposure 05/17/2016  . Raynaud phenomenon 05/17/2016  . Shortness of breath 03/22/2016  . GERD (gastroesophageal reflux disease) 03/22/2016  . Arthritis 03/22/2016  . Chronic seasonal allergic rhinitis 03/22/2016  . Pancreatitis 03/09/2015  . Essential hypertension   . Loss of weight   . Thoracic compression fracture ([place])   . Paroxysmal atrial fibrillation ([facility])   . Atrial flutter ([place]) 11/17/2014  . Breast cancer of upper-outer quadrant of left female breast ([place]) 06/25/2014   Past Medical History:  Diagnosis Date  . Allergic rhinitis   . Breast cancer ([place])       . Dyspnea    chronic with exertion   . GERD (gastroesophageal reflux disease)   . 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 History of GI bleed    last episode 02-02-16; was due to hemorroids   . History of kidney infection   . History of pulmonary hypertension    mild ; (see  ECHO result in LOV with Dr. Kela Millin 06-18-17 on chart )  . HLD (hyperlipidemia)    denies   . Hypertension   . ILD (interstitial lung disease) ([place])   . Iron deficiency anemia    gets iv infusions  . Persistent atrial fibrillation    a. s/p PVI 2009 in [place]  . Raynaud phenomenon    no issues currently   . Spinal stenosis    lumbar     Past Surgical History:  Procedure Laterality Date  . ABLATION  2009   [place]  . afib ablation  10/06/2010  . APPENDECTOMY    . BREAST LUMPECTOMY Left 2007   malignant  . BREAST SURGERY Left 2007   partial mastectomy and lumpectomy   .  CARDIOVERSION N/A 11/18/2014   Procedure: CARDIOVERSION;  Surgeon: Adrian Prows, MD;  Location: [facility];  Service: Cardiovascular;  Laterality: N/A;  . carpel tunnel    . CESAREAN SECTION    . GANGLION CYST EXCISION    . HEMORRHOID SURGERY    . KYPHOPLASTY  2017  . TONSILLECTOMY    . TOTAL HIP ARTHROPLASTY Left 07/03/2017   Procedure: LEFT TOTAL HIP ARTHROPLASTY ANTERIOR APPROACH;  Surgeon: Paralee Cancel, MD;  Location: [facility];  Service: Orthopedics;  Laterality: Left;  70 mins    No current facility-administered medications for this encounter.    Current Outpatient Medications  Medication Sig Dispense Refill Last Dose  . atenolol (TENORMIN) 50 MG tablet Take 50 mg by mouth daily.   Taking  . Calcium Carb-Cholecalciferol (CALCIUM 600+D3 PO) Take 1 tablet by mouth every morning.    Taking  . Coenzyme Q10 (CO Q10) 100 MG CAPS Take 100 mg by mouth every morning.    Taking  . flecainide (TAMBOCOR) 50 MG tablet Take 1 tablet (50 mg total) by mouth every 12 (twelve) hours. (Patient taking differently: Take 100 mg by mouth every morning. ) 60 tablet 0 Taking  . hydrochlorothiazide (HYDRODIURIL) 25 MG tablet Take 25 mg by mouth daily.   Taking  . loperamide (IMODIUM) 2 MG capsule Take 1 capsule (2 mg total) by mouth every 6 (six) hours as needed for diarrhea or loose stools. 30 capsule 0 Taking  . pantoprazole (PROTONIX) 40 MG tablet Take 40 mg by mouth every morning.    Taking  . potassium gluconate 595 (99 K) MG TABS tablet Take 595 mg by mouth daily.   Taking  . rivaroxaban (XARELTO) 20 MG TABS tablet Take 1 tablet (20 mg total) by mouth daily with supper. (Patient taking differently: Take 20 mg by mouth every morning. ) 30 tablet 0 Taking  . vitamin E 400 UNIT capsule Take 400 Units by mouth every morning.    Taking   Allergies  Allergen Reactions  . Aspirin Other (See Comments)    Bloody noses  . Blue Dyes (Parenteral) Nausea And Vomiting  . Demerol [Meperidine] Nausea And Vomiting     Social History   Tobacco Use  . Smoking status: Passive Smoke Exposure - Never Smoker  . Smokeless tobacco: Never Used  . Tobacco comment: exposed to second-hand smoke her entire life  Substance Use Topics  . Alcohol use: Yes    Comment: social ETOH, beer     Family History  Problem Relation Age of Onset  . Lung cancer Sister   . Heart failure Mother   . Brain cancer Father   . Lung disease Neg Hx   . Rheumatologic disease Neg Hx   .  Colon cancer Neg Hx   . Colon polyps Neg Hx      Review of Systems  Constitutional: Negative.   HENT: Negative.   Eyes: Negative.   Respiratory: Positive for cough and shortness of breath (with exertion).   Cardiovascular: Negative.   Gastrointestinal: Positive for heartburn.  Genitourinary: Negative.   Musculoskeletal: Positive for joint pain.  Skin: Negative.   Neurological: Negative.   Endo/Heme/Allergies: Positive for environmental allergies.  Psychiatric/Behavioral: Negative.     Objective:  Physical Exam  Constitutional: She is oriented to person, place, and time. She appears well-developed.  HENT:  Head: Normocephalic.  Eyes: Pupils are equal, round, and reactive to light.  Neck: Neck supple. No JVD present. No tracheal deviation present. No thyromegaly present.  Cardiovascular: Normal rate, regular rhythm and intact distal pulses.  Respiratory: Effort normal and breath sounds normal. No respiratory distress. She has no wheezes.  GI: Soft. There is no abdominal tenderness. There is no guarding.  Musculoskeletal:     Right hip: She exhibits decreased range of motion, decreased strength, tenderness and bony tenderness. She exhibits no swelling, no deformity and no laceration.  Lymphadenopathy:    She has no cervical adenopathy.  Neurological: She is alert and oriented to person, place, and time.  Skin: Skin is warm and dry.  Psychiatric: She has a normal mood and affect.      Labs:  Estimated body mass index is 25.61  kg/m as calculated from the following:   Height as of 02/27/18: 5\' 4"  (1.626 m).   Weight as of 02/27/18: 67.7 kg.   Imaging Review Plain radiographs demonstrate severe degenerative joint disease of the right hip. The bone quality appears to be good for age and reported activity level.    Preoperative templating of the joint replacement has been completed, documented, and submitted to the Operating Room personnel in order to optimize intra-operative equipment management.     Assessment/Plan:  End stage arthritis, right hip  The patient history, physical examination, clinical judgement of the provider and imaging studies are consistent with end stage degenerative joint disease of the right hip and total hip arthroplasty is deemed medically necessary. The treatment options including medical management, injection therapy, arthroscopy and arthroplasty were discussed at length. The risks and benefits of total hip arthroplasty were presented and reviewed. The risks due to aseptic loosening, infection, stiffness, dislocation/subluxation,  thromboembolic complications and other imponderables were discussed.  The patient acknowledged the explanation, agreed to proceed with the plan and consent was signed. Patient is being admitted for inpatient treatment for surgery, pain control, PT, OT, prophylactic antibiotics, VTE prophylaxis, progressive ambulation and ADL's and discharge planning.The patient is planning to be discharged home.      [place] Pugh Yarnell Arvidson   PA-C  04/22/2018, 8:53 AM

## 2018-04-25 ENCOUNTER — Encounter (HOSPITAL_COMMUNITY): Payer: Self-pay

## 2018-04-25 NOTE — Progress Notes (Signed)
Clearance Dr. Einar Gip 04-12-18 on chart  EKG 12-24-17 epic  cxr 12-30-17 epic  Echo 12-23-17 epic

## 2018-04-25 NOTE — Patient Instructions (Addendum)
Karina Tran  04/25/2018   Your procedure is scheduled on: 05-10-18  Report to [facility] Main  Entrance               Report to admitting at       1145  AM    Call this number if you have problems the morning of surgery [phone]    Remember: Do not eat food  :After Midnight  You may have clear liquids until        0815 am    then nothing by mouth    CLEAR LIQUID DIET   Foods Allowed                                                                     Foods Excluded  Coffee and tea, regular and decaf                             liquids that you cannot  Plain Jell-O in any flavor                                             see through such as: Fruit ices (not with fruit pulp)                                     milk, soups, orange juice  Iced Popsicles                                    All solid food Carbonated beverages, regular and diet                                    Cranberry, grape and apple juices Sports drinks like Gatorade Lightly seasoned clear broth or consume(fat free) Sugar, honey syrup  _____________________________________________________________________    BRUSH YOUR TEETH MORNING OF SURGERY AND RINSE YOUR MOUTH OUT, NO CHEWING GUM CANDY OR MINTS.     Take these medicines the morning of surgery with A SIP OF WATER: protonix, flecainide, atenelol, amlodipine                                You may not have any metal on your body including hair pins and              piercings  Do not wear jewelry, make-up, lotions, powders or perfumes, deodorant             Do not wear nail polish.  Do not shave  48 hours prior to surgery.                Do not bring valuables to the hospital. [facility] IS NOT  RESPONSIBLE   FOR VALUABLES.  Contacts, dentures or bridgework may not be worn into surgery.  Leave suitcase in the car. After surgery it may be brought to your room.               Please read over the following fact  sheets you were given: _____________________________________________________________________           [facility] - Preparing for Surgery Before surgery, you can play an important role.  Because skin is not sterile, your skin needs to be as free of germs as possible.  You can reduce the number of germs on your skin by washing with CHG (chlorahexidine gluconate) soap before surgery.  CHG is an antiseptic cleaner which kills germs and bonds with the skin to continue killing germs even after washing. Please DO NOT use if you have an allergy to CHG or antibacterial soaps.  If your skin becomes reddened/irritated stop using the CHG and inform your nurse when you arrive at Short Stay. Do not shave (including legs and underarms) for at least 48 hours prior to the first CHG shower.  You may shave your face/neck. Please follow these instructions carefully:  1.  Shower with CHG Soap the night before surgery and the  morning of Surgery.  2.  If you choose to wash your hair, wash your hair first as usual with your  normal  shampoo.  3.  After you shampoo, rinse your hair and body thoroughly to remove the  shampoo.                           4.  Use CHG as you would any other liquid soap.  You can apply chg directly  to the skin and wash                       Gently with a scrungie or clean washcloth.  5.  Apply the CHG Soap to your body ONLY FROM THE NECK DOWN.   Do not use on face/ open                           Wound or open sores. Avoid contact with eyes, ears mouth and genitals (private parts).                       Wash face,  Genitals (private parts) with your normal soap.             6.  Wash thoroughly, paying special attention to the area where your surgery  will be performed.  7.  Thoroughly rinse your body with warm water from the neck down.  8.  DO NOT shower/wash with your normal soap after using and rinsing off  the CHG Soap.                9.  Pat yourself dry with a clean towel.             10.  Wear clean pajamas.            11.  Place clean sheets on your bed the night of your first shower and do not  sleep with pets. Day of Surgery : Do not apply any lotions/deodorants the morning of surgery.  Please wear clean clothes to the hospital/surgery center.  FAILURE TO FOLLOW THESE INSTRUCTIONS MAY RESULT IN THE CANCELLATION OF  YOUR SURGERY PATIENT SIGNATURE_________________________________  NURSE SIGNATURE__________________________________  ________________________________________________________________________  WHAT IS A BLOOD TRANSFUSION? Blood Transfusion Information  A transfusion is the replacement of blood or some of its parts. Blood is made up of multiple cells which provide different functions.  Red blood cells carry oxygen and are used for blood loss replacement.  White blood cells fight against infection.  Platelets control bleeding.  Plasma helps clot blood.  Other blood products are available for specialized needs, such as hemophilia or other clotting disorders. BEFORE THE TRANSFUSION  Who gives blood for transfusions?   Healthy volunteers who are fully evaluated to make sure their blood is safe. This is blood bank blood. Transfusion therapy is the safest it has ever been in the practice of medicine. Before blood is taken from a donor, a complete history is taken to make sure that person has no history of diseases nor engages in risky social behavior (examples are intravenous drug use or sexual activity with multiple partners). The donor's travel history is screened to minimize risk of transmitting infections, such as malaria. The donated blood is tested for signs of infectious diseases, such as HIV and hepatitis. The blood is then tested to be sure it is compatible with you in order to minimize the chance of a transfusion reaction. If you or a relative donates blood, this is often done in anticipation of surgery and is not appropriate for emergency situations. It  takes many days to process the donated blood. RISKS AND COMPLICATIONS Although transfusion therapy is very safe and saves many lives, the main dangers of transfusion include:   Getting an infectious disease.  Developing a transfusion reaction. This is an allergic reaction to something in the blood you were given. Every precaution is taken to prevent this. The decision to have a blood transfusion has been considered carefully by your caregiver before blood is given. Blood is not given unless the benefits outweigh the risks. AFTER THE TRANSFUSION  Right after receiving a blood transfusion, you will usually feel much better and more energetic. This is especially true if your red blood cells have gotten low (anemic). The transfusion raises the level of the red blood cells which carry oxygen, and this usually causes an energy increase.  The nurse administering the transfusion will monitor you carefully for complications. HOME CARE INSTRUCTIONS  No special instructions are needed after a transfusion. You may find your energy is better. Speak with your caregiver about any limitations on activity for underlying diseases you may have. SEEK MEDICAL CARE IF:   Your condition is not improving after your transfusion.  You develop redness or irritation at the intravenous (IV) site. SEEK IMMEDIATE MEDICAL CARE IF:  Any of the following symptoms occur over the next 12 hours:  Shaking chills.  You have a temperature by mouth above 102 F (38.9 C), not controlled by medicine.  Chest, back, or muscle pain.  People around you feel you are not acting correctly or are confused.  Shortness of breath or difficulty breathing.  Dizziness and fainting.  You get a rash or develop hives.  You have a decrease in urine output.  Your urine turns a dark color or changes to pink, red, or brown. Any of the following symptoms occur over the next 10 days:  You have a temperature by mouth above 102 F (38.9  C), not controlled by medicine.  Shortness of breath.  Weakness after normal activity.  The white part of the eye turns yellow (jaundice).  You  have a decrease in the amount of urine or are urinating less often.  Your urine turns a dark color or changes to pink, red, or brown. Document Released: 03/24/2000 Document Revised: 06/19/2011 Document Reviewed: 11/11/2007 Exit[employer] Patient Information 2014 [employer].  _______________________________________________________________________  Incentive Spirometer  An incentive spirometer is a tool that can help keep your lungs clear and active. This tool measures how well you are filling your lungs with each breath. Taking long deep breaths may help reverse or decrease the chance of developing breathing (pulmonary) problems (especially infection) following:  A long period of time when you are unable to move or be active. BEFORE THE PROCEDURE   If the spirometer includes an indicator to show your best effort, your nurse or respiratory therapist will set it to a desired goal.  If possible, sit up straight or lean slightly forward. Try not to slouch.  Hold the incentive spirometer in an upright position. INSTRUCTIONS FOR USE  1. Sit on the edge of your bed if possible, or sit up as far as you can in bed or on a chair. 2. Hold the incentive spirometer in an upright position. 3. Breathe out normally. 4. Place the mouthpiece in your mouth and seal your lips tightly around it. 5. Breathe in slowly and as deeply as possible, raising the piston or the ball toward the top of the column. 6. Hold your breath for 3-5 seconds or for as long as possible. Allow the piston or ball to fall to the bottom of the column. 7. Remove the mouthpiece from your mouth and breathe out normally. 8. Rest for a few seconds and repeat Steps 1 through 7 at least 10 times every 1-2 hours when you are awake. Take your time and take a few normal breaths between deep  breaths. 9. The spirometer may include an indicator to show your best effort. Use the indicator as a goal to work toward during each repetition. 10. After each set of 10 deep breaths, practice coughing to be sure your lungs are clear. If you have an incision (the cut made at the time of surgery), support your incision when coughing by placing a pillow or rolled up towels firmly against it. Once you are able to get out of bed, walk around indoors and cough well. You may stop using the incentive spirometer when instructed by your caregiver.  RISKS AND COMPLICATIONS  Take your time so you do not get dizzy or light-headed.  If you are in pain, you may need to take or ask for pain medication before doing incentive spirometry. It is harder to take a deep breath if you are having pain. AFTER USE  Rest and breathe slowly and easily.  It can be helpful to keep track of a log of your progress. Your caregiver can provide you with a simple table to help with this. If you are using the spirometer at home, follow these instructions: [place] IF:   You are having difficultly using the spirometer.  You have trouble using the spirometer as often as instructed.  Your pain medication is not giving enough relief while using the spirometer.  You develop fever of 100.5 F (38.1 C) or higher. SEEK IMMEDIATE MEDICAL CARE IF:   You cough up bloody sputum that had not been present before.  You develop fever of 102 F (38.9 C) or greater.  You develop worsening pain at or near the incision site. MAKE SURE YOU:   Understand these instructions.  Will watch your condition.  Will get help right away if you are not doing well or get worse. Document Released: 08/07/2006 Document Revised: 06/19/2011 Document Reviewed: 10/08/2006 [facility] Patient Information 2014 [place], [place].   ________________________________________________________________________

## 2018-05-01 ENCOUNTER — Encounter (HOSPITAL_COMMUNITY)
Admission: RE | Admit: 2018-05-01 | Discharge: 2018-05-01 | Disposition: A | Discharge status: Home / Self Care | Payer: Medicare Other | Source: Ambulatory Visit | Attending: Orthopedic Surgery | Admitting: Orthopedic Surgery

## 2018-05-01 ENCOUNTER — Encounter (HOSPITAL_COMMUNITY): Payer: Self-pay

## 2018-05-01 ENCOUNTER — Other Ambulatory Visit: Payer: Self-pay

## 2018-05-01 DIAGNOSIS — M1611 Unilateral primary osteoarthritis, right hip: Secondary | ICD-10-CM | POA: Insufficient documentation

## 2018-05-01 DIAGNOSIS — Z01812 Encounter for preprocedural laboratory examination: Secondary | ICD-10-CM | POA: Insufficient documentation

## 2018-05-01 HISTORY — DX: Cardiac arrhythmia, unspecified: I49.9

## 2018-05-01 LAB — BASIC METABOLIC PANEL
Anion gap: 8 (ref 5–15)
BUN: 12 mg/dL (ref 8–23)
CO2: 29 mmol/L (ref 22–32)
CREATININE: 0.84 mg/dL (ref 0.44–1.00)
Calcium: 9.2 mg/dL (ref 8.9–10.3)
Chloride: 101 mmol/L (ref 98–111)
GFR calc Af Amer: >60 mL/min (ref >60)
GFR calc non Af Amer: >60 mL/min (ref >60)
Glucose, Bld: 97 mg/dL (ref 70–99)
Potassium: 4 mmol/L (ref 3.5–5.1)
Sodium: 138 mmol/L (ref 135–145)

## 2018-05-01 LAB — CBC
HCT: 45.5 % (ref 36.0–46.0)
Hemoglobin: 14.2 g/dL (ref 12.0–15.0)
MCH: 29.5 pg (ref 26.0–34.0)
MCHC: 31.2 g/dL (ref 30.0–36.0)
MCV: 94.6 fL (ref 80.0–100.0)
PLATELETS: 269 10*3/uL (ref 150–400)
RBC: 4.81 MIL/uL (ref 3.87–5.11)
RDW: 14.6 % (ref 11.5–15.5)
WBC: 9.4 10*3/uL (ref 4.0–10.5)
nRBC: 0 % (ref 0.0–0.2)

## 2018-05-01 LAB — SURGICAL PCR SCREEN
MRSA, PCR: NEGATIVE
Staphylococcus aureus: NEGATIVE

## 2018-05-01 NOTE — Progress Notes (Signed)
PCP:  Kip Corrington  CARDIOLOGIST:Dr. Einar Gip   INFO IN Epic:clearance  Mild pulmonary HTN  Under pt. History in epic pt. Denies at [place] dr. Einar Gip on chart  BLOOD THINNERS AND LAST DOSES:xarelto 2 days prior per chart pt. Unaware with what to stop preop left VM with Orson Slick informing her of this. ____________________________________  PATIENT SYMPTOMS AT TIME OF PREOP:NONE

## 2018-05-01 NOTE — Progress Notes (Signed)
LVM with Orson Slick pt. Has no instructions on what to stop prior to surgery

## 2018-05-06 NOTE — Progress Notes (Signed)
Anesthesia Chart Review   Case:  [id number] Date/Time:  05/09/18 0700   Procedure:  TOTAL HIP ARTHROPLASTY ANTERIOR APPROACH (Right ) - 70 minutes   Anesthesia type:  Spinal   Pre-op diagnosis:  right hip osteoarthritis   Location:  [place] 09 / [facility] ORS   Surgeon:  Paralee Cancel, MD      DISCUSSION: 78 yo never smoker with h/o A-fib (anticoagulated with Xarelto), HLD, HTN, GERD, ILD, breast cancer (left s/p radiation), right hip OA scheduled for above surgery on 05/09/18 with Dr. Paralee Cancel.    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  She experiences persistent dry cough and shortness of breath with walking.  At last visit on 03/20/18 spirometry normal O2 99%.  O2 at PST visit on 05/01/18 100%.    Cardiac clearance received from Dr. Einar Gip on 02/18/19, on chart.  Per his clearance pt is low risk and advised to stop Xarelto 2-3 days prior to surgery.    Pt can proceed with planned procedure barring acute status change.  VS: BP 139/85   Pulse 68   Temp 36.9 C (Oral)   Resp 16   Ht 5\' 4"  (1.626 m)   Wt 67.1 kg   SpO2 100%   BMI 25.40 kg/m   PROVIDERS: Jettie Booze, NP is PCP  Rolly Salter, MD is Cardiologist  LABS: Labs reviewed: Acceptable for surgery. (all labs ordered are listed, but only abnormal results are displayed)  Labs Reviewed  SURGICAL PCR SCREEN  CBC  BASIC METABOLIC PANEL  TYPE AND SCREEN     IMAGES: Chest Xray 12/30/17 IMPRESSION: 1. No significant change from most recent prior study allowing for differences technique. 2. Small pleural effusions with associated basilar atelectasis. 3. Chronic interstitial thickening consistent with fibrosis. No convincing pneumonia or pulmonary edema.  EKG: 12/22/17 Rate 75 bpm Sinus or ectopic atrial rhythm Short PR interval Anteroseptal infarct, age undetermined   CV: Echo 12/23/17 Study Conclusions  - Left ventricle: The cavity size was normal. Wall thickness was  increased in a pattern of mild LVH. Systolic function was mildly   reduced. The estimated ejection fraction was 45%. Diffuse   hypokinesis. Features are consistent with a pseudonormal left   ventricular filling pattern, with concomitant abnormal relaxation   and increased filling pressure (grade 2 diastolic dysfunction). - Aortic valve: Mildly thickened leaflets. There was mild   regurgitation. - Mitral valve: There was mild regurgitation. - Left atrium: The atrium was moderately dilated. - Right ventricle: Systolic function was mildly reduced. - Right atrium: The atrium was moderately dilated. - Atrial septum: No defect or patent foramen ovale was identified. - Tricuspid valve: There was moderate regurgitation. - Pulmonary arteries: PA peak pressure: 33 mm Hg (S). - Inferior vena cava: The vessel was dilated. The respirophasic   diameter changes were blunted (< 50%), consistent with elevated   central venous pressure. Estimated CVP 15 mmHg. Past Medical History:  Diagnosis Date  . Allergic rhinitis   . Breast cancer ([place])    left  . Dyspnea    chronic with exertion   . Dysrhythmia    a fib  had ablation in [place]   . GERD (gastroesophageal reflux disease)   . 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 History of GI bleed    last episode 02-02-16; was due to hemorroids   . History of kidney infection   . History of pulmonary hypertension    mild ; (see ECHO result in  LOV with Dr. Kela Millin 06-18-17 on chart )  pt. denies at preop  . HLD (hyperlipidemia)    denies   . Hypertension   . ILD (interstitial lung disease) ([place])   . Iron deficiency anemia    gets iv infusions  . Persistent atrial fibrillation    a. s/p PVI 2009 in [place]  . Raynaud phenomenon    no issues currently   . Spinal stenosis    lumbar     Past Surgical History:  Procedure Laterality Date  . ABLATION  2009   PVI in [place]  . afib ablation  10/06/2010  . APPENDECTOMY    . BREAST LUMPECTOMY Left 2007   malignant  .  BREAST SURGERY Left 2007   partial mastectomy and lumpectomy   . CARDIOVERSION N/A 11/18/2014   Procedure: CARDIOVERSION;  Surgeon: Adrian Prows, MD;  Location: [facility];  Service: Cardiovascular;  Laterality: N/A;  . carpel tunnel    . CESAREAN SECTION    . GANGLION CYST EXCISION     x2  . HEMORRHOID SURGERY    . KYPHOPLASTY  2017  . TONSILLECTOMY    . TOTAL HIP ARTHROPLASTY Left 07/03/2017   Procedure: LEFT TOTAL HIP ARTHROPLASTY ANTERIOR APPROACH;  Surgeon: Paralee Cancel, MD;  Location: [facility];  Service: Orthopedics;  Laterality: Left;  70 mins    MEDICATIONS: . amLODipine (NORVASC) 5 MG tablet  . atenolol (TENORMIN) 50 MG tablet  . Calcium Carb-Cholecalciferol (CALCIUM 600+D3 PO)  . Coenzyme Q10 (CO Q10) 200 MG CAPS  . flecainide (TAMBOCOR) 50 MG tablet  . hydrochlorothiazide (HYDRODIURIL) 25 MG tablet  . loperamide (IMODIUM) 2 MG capsule  . Multiple Vitamins-Minerals (MULTIVITAMIN ADULT) CHEW  . pantoprazole (PROTONIX) 40 MG tablet  . POTASSIUM GLUCONATE PO  . rivaroxaban (XARELTO) 20 MG TABS tablet   No current facility-administered medications for this encounter.     Maia Plan [facility] Pre-Surgical Testing [phone] 05/06/18 11:59 AM

## 2018-05-06 NOTE — Anesthesia Preprocedure Evaluation (Addendum)
Anesthesia Evaluation  Patient identified by MRN, date of birth, ID band Patient awake    Reviewed: Allergy & Precautions, NPO status , Patient's Chart, lab work & pertinent test results  Airway Mallampati: II  TM Distance: >3 FB Neck ROM: Full    Dental no notable dental hx.    Pulmonary neg pulmonary ROS,    Pulmonary exam normal breath sounds clear to auscultation       Cardiovascular hypertension, Normal cardiovascular exam Rhythm:Regular Rate:Normal     Neuro/Psych negative neurological ROS  negative psych ROS   GI/Hepatic negative GI ROS, Neg liver ROS,   Endo/Other  negative endocrine ROS  Renal/GU negative Renal ROS  negative genitourinary   Musculoskeletal  (+) Arthritis ,   Abdominal   Peds negative pediatric ROS (+)  Hematology negative hematology ROS (+)   Anesthesia Other Findings   Reproductive/Obstetrics negative OB ROS                            Anesthesia Physical Anesthesia Plan  ASA: III  Anesthesia Plan: Spinal   Post-op Pain Management:    Induction: Intravenous  PONV Risk Score and Plan:   Airway Management Planned: Simple Face Mask  Additional Equipment:   Intra-op Plan:   Post-operative Plan:   Informed Consent: I have reviewed the patients History and Physical, chart, labs and discussed the procedure including the risks, benefits and alternatives for the proposed anesthesia with the patient or authorized representative who has indicated his/her understanding and acceptance.     Dental advisory given  Plan Discussed with: CRNA and Surgeon  Anesthesia Plan Comments: (See PST note 05/01/18, Konrad Felix, PA-C)       Anesthesia Quick Evaluation

## 2018-05-09 ENCOUNTER — Inpatient Hospital Stay (HOSPITAL_COMMUNITY): Payer: Medicare Other

## 2018-05-09 ENCOUNTER — Observation Stay (HOSPITAL_COMMUNITY)
Admission: RE | Admit: 2018-05-09 | Discharge: 2018-05-10 | Disposition: A | Discharge status: Home / Self Care | Payer: Medicare Other | Attending: Orthopedic Surgery | Admitting: Orthopedic Surgery

## 2018-05-09 ENCOUNTER — Encounter (HOSPITAL_COMMUNITY): Payer: Self-pay | Admitting: *Deleted

## 2018-05-09 ENCOUNTER — Inpatient Hospital Stay (HOSPITAL_COMMUNITY): Payer: Medicare Other | Admitting: Anesthesiology

## 2018-05-09 ENCOUNTER — Observation Stay (HOSPITAL_COMMUNITY): Payer: Medicare Other

## 2018-05-09 ENCOUNTER — Inpatient Hospital Stay (HOSPITAL_COMMUNITY): Payer: Medicare Other | Admitting: Physician Assistant

## 2018-05-09 ENCOUNTER — Other Ambulatory Visit: Payer: Self-pay

## 2018-05-09 ENCOUNTER — Encounter (HOSPITAL_COMMUNITY)
Admission: RE | Disposition: A | Discharge status: Home / Self Care | Payer: Self-pay | Source: Home / Self Care | Attending: Orthopedic Surgery

## 2018-05-09 DIAGNOSIS — I10 Essential (primary) hypertension: Secondary | ICD-10-CM | POA: Insufficient documentation

## 2018-05-09 DIAGNOSIS — I4819 Other persistent atrial fibrillation: Secondary | ICD-10-CM | POA: Insufficient documentation

## 2018-05-09 DIAGNOSIS — Z7722 Contact with and (suspected) exposure to environmental tobacco smoke (acute) (chronic): Secondary | ICD-10-CM | POA: Insufficient documentation

## 2018-05-09 DIAGNOSIS — J849 Interstitial pulmonary disease, unspecified: Secondary | ICD-10-CM | POA: Diagnosis not present

## 2018-05-09 DIAGNOSIS — Z79899 Other long term (current) drug therapy: Secondary | ICD-10-CM | POA: Insufficient documentation

## 2018-05-09 DIAGNOSIS — Z7901 Long term (current) use of anticoagulants: Secondary | ICD-10-CM | POA: Diagnosis not present

## 2018-05-09 DIAGNOSIS — M1611 Unilateral primary osteoarthritis, right hip: Principal | ICD-10-CM | POA: Insufficient documentation

## 2018-05-09 DIAGNOSIS — Z853 Personal history of malignant neoplasm of breast: Secondary | ICD-10-CM | POA: Diagnosis not present

## 2018-05-09 DIAGNOSIS — Z885 Allergy status to narcotic agent status: Secondary | ICD-10-CM | POA: Diagnosis not present

## 2018-05-09 DIAGNOSIS — Z96649 Presence of unspecified artificial hip joint: Secondary | ICD-10-CM

## 2018-05-09 DIAGNOSIS — Z96641 Presence of right artificial hip joint: Secondary | ICD-10-CM

## 2018-05-09 DIAGNOSIS — K219 Gastro-esophageal reflux disease without esophagitis: Secondary | ICD-10-CM | POA: Insufficient documentation

## 2018-05-09 DIAGNOSIS — Z419 Encounter for procedure for purposes other than remedying health state, unspecified: Secondary | ICD-10-CM

## 2018-05-09 HISTORY — PX: TOTAL HIP ARTHROPLASTY: SHX124

## 2018-05-09 LAB — TYPE AND SCREEN
ABO/RH(D): O NEG
ANTIBODY SCREEN: NEGATIVE

## 2018-05-09 SURGERY — ARTHROPLASTY, HIP, TOTAL, ANTERIOR APPROACH
Anesthesia: Spinal | Site: Hip | Laterality: Right

## 2018-05-09 MED ORDER — ONDANSETRON HCL 4 MG/2ML IJ SOLN: 4.0000 mg | Freq: Four times a day (QID) | INTRAMUSCULAR | Status: DC | PRN

## 2018-05-09 MED ORDER — ROCURONIUM BROMIDE 10 MG/ML (PF) SYRINGE
PREFILLED_SYRINGE | INTRAVENOUS | Status: DC | PRN
  Administered 2018-05-09: 40 mg via INTRAVENOUS

## 2018-05-09 MED ORDER — HYDROCHLOROTHIAZIDE 25 MG PO TABS
25.0000 mg | ORAL_TABLET | Freq: Every day | ORAL | Status: DC
  Administered 2018-05-10: 25 mg via ORAL
  Filled 2018-05-09: qty 1

## 2018-05-09 MED ORDER — TRANEXAMIC ACID-NACL 1000-0.7 MG/100ML-% IV SOLN
1000.0000 mg | INTRAVENOUS | Status: AC
  Administered 2018-05-09: 1000 mg via INTRAVENOUS

## 2018-05-09 MED ORDER — ONDANSETRON HCL 4 MG/2ML IJ SOLN
INTRAMUSCULAR | Status: AC
  Filled 2018-05-09: qty 2

## 2018-05-09 MED ORDER — CEFAZOLIN SODIUM-DEXTROSE 2-4 GM/100ML-% IV SOLN
2.0000 g | Freq: Four times a day (QID) | INTRAVENOUS | Status: AC
  Administered 2018-05-09 (×2): 2 g via INTRAVENOUS
  Filled 2018-05-09 (×2): qty 100

## 2018-05-09 MED ORDER — HYDROMORPHONE HCL 1 MG/ML IJ SOLN
INTRAMUSCULAR | Status: AC
  Filled 2018-05-09: qty 1

## 2018-05-09 MED ORDER — CEFAZOLIN SODIUM-DEXTROSE 2-4 GM/100ML-% IV SOLN
INTRAVENOUS | Status: AC
  Filled 2018-05-09: qty 100

## 2018-05-09 MED ORDER — HYDROMORPHONE HCL 1 MG/ML IJ SOLN
0.2500 mg | INTRAMUSCULAR | Status: DC | PRN
  Administered 2018-05-09 (×2): 0.5 mg via INTRAVENOUS

## 2018-05-09 MED ORDER — ROCURONIUM BROMIDE 100 MG/10ML IV SOLN
INTRAVENOUS | Status: AC
  Filled 2018-05-09: qty 1

## 2018-05-09 MED ORDER — ONDANSETRON HCL 4 MG PO TABS: 4.0000 mg | ORAL_TABLET | Freq: Four times a day (QID) | ORAL | Status: DC | PRN

## 2018-05-09 MED ORDER — FERROUS SULFATE 325 (65 FE) MG PO TABS
325.0000 mg | ORAL_TABLET | Freq: Three times a day (TID) | ORAL | Status: DC
  Administered 2018-05-10: 325 mg via ORAL
  Filled 2018-05-09: qty 1

## 2018-05-09 MED ORDER — ACETAMINOPHEN 325 MG PO TABS: 325.0000 mg | ORAL_TABLET | Freq: Four times a day (QID) | ORAL | Status: DC | PRN

## 2018-05-09 MED ORDER — TRANEXAMIC ACID-NACL 1000-0.7 MG/100ML-% IV SOLN
INTRAVENOUS | Status: AC
  Filled 2018-05-09: qty 100

## 2018-05-09 MED ORDER — CHLORHEXIDINE GLUCONATE 4 % EX LIQD: 60.0000 mL | Freq: Once | CUTANEOUS | Status: DC

## 2018-05-09 MED ORDER — SUGAMMADEX SODIUM 200 MG/2ML IV SOLN
INTRAVENOUS | Status: DC | PRN
  Administered 2018-05-09: 140 mg via INTRAVENOUS

## 2018-05-09 MED ORDER — METHOCARBAMOL 500 MG IVPB - SIMPLE MED
INTRAVENOUS | Status: AC
  Filled 2018-05-09: qty 50

## 2018-05-09 MED ORDER — STERILE WATER FOR IRRIGATION IR SOLN
Status: DC | PRN
  Administered 2018-05-09: 2000 mL

## 2018-05-09 MED ORDER — MAGNESIUM CITRATE PO SOLN: 1.0000 | Freq: Once | ORAL | Status: DC | PRN

## 2018-05-09 MED ORDER — LIDOCAINE 2% (20 MG/ML) 5 ML SYRINGE
INTRAMUSCULAR | Status: AC
  Filled 2018-05-09: qty 5

## 2018-05-09 MED ORDER — PROPOFOL 10 MG/ML IV BOLUS
INTRAVENOUS | Status: AC
  Filled 2018-05-09: qty 20

## 2018-05-09 MED ORDER — POLYETHYLENE GLYCOL 3350 17 G PO PACK
17.0000 g | PACK | Freq: Two times a day (BID) | ORAL | Status: DC
  Administered 2018-05-10: 17 g via ORAL
  Filled 2018-05-09: qty 1

## 2018-05-09 MED ORDER — GLYCOPYRROLATE PF 0.2 MG/ML IJ SOSY
PREFILLED_SYRINGE | INTRAMUSCULAR | Status: AC
  Filled 2018-05-09: qty 1

## 2018-05-09 MED ORDER — PHENOL 1.4 % MT LIQD: 1.0000 | OROMUCOSAL | Status: DC | PRN

## 2018-05-09 MED ORDER — FENTANYL CITRATE (PF) 100 MCG/2ML IJ SOLN
INTRAMUSCULAR | Status: AC
  Filled 2018-05-09: qty 2

## 2018-05-09 MED ORDER — ALUM & MAG HYDROXIDE-SIMETH 200-200-20 MG/5ML PO SUSP: 15.0000 mL | ORAL | Status: DC | PRN

## 2018-05-09 MED ORDER — ONDANSETRON HCL 4 MG/2ML IJ SOLN
INTRAMUSCULAR | Status: DC | PRN
  Administered 2018-05-09: 4 mg via INTRAVENOUS

## 2018-05-09 MED ORDER — SODIUM CHLORIDE 0.9 % IV SOLN
INTRAVENOUS | Status: DC | PRN
  Administered 2018-05-09: 40 ug/min via INTRAVENOUS

## 2018-05-09 MED ORDER — CELECOXIB 200 MG PO CAPS
200.0000 mg | ORAL_CAPSULE | Freq: Two times a day (BID) | ORAL | Status: DC
  Administered 2018-05-09 – 2018-05-10 (×2): 200 mg via ORAL
  Filled 2018-05-09 (×2): qty 1

## 2018-05-09 MED ORDER — DEXAMETHASONE SODIUM PHOSPHATE 10 MG/ML IJ SOLN
INTRAMUSCULAR | Status: AC
  Filled 2018-05-09: qty 1

## 2018-05-09 MED ORDER — FENTANYL CITRATE (PF) 100 MCG/2ML IJ SOLN
INTRAMUSCULAR | Status: DC | PRN
  Administered 2018-05-09 (×2): 50 ug via INTRAVENOUS
  Administered 2018-05-09: 100 ug via INTRAVENOUS

## 2018-05-09 MED ORDER — BISACODYL 10 MG RE SUPP: 10.0000 mg | Freq: Every day | RECTAL | Status: DC | PRN

## 2018-05-09 MED ORDER — ATENOLOL 50 MG PO TABS
50.0000 mg | ORAL_TABLET | Freq: Every day | ORAL | Status: DC
  Administered 2018-05-10: 50 mg via ORAL
  Filled 2018-05-09: qty 1

## 2018-05-09 MED ORDER — PANTOPRAZOLE SODIUM 40 MG PO TBEC
40.0000 mg | DELAYED_RELEASE_TABLET | Freq: Every morning | ORAL | Status: DC
  Administered 2018-05-10: 40 mg via ORAL
  Filled 2018-05-09: qty 1

## 2018-05-09 MED ORDER — LIP MEDEX EX OINT
TOPICAL_OINTMENT | CUTANEOUS | Status: AC
  Administered 2018-05-09: 12:00:00
  Filled 2018-05-09: qty 7

## 2018-05-09 MED ORDER — HYDROMORPHONE HCL 1 MG/ML IJ SOLN
INTRAMUSCULAR | Status: DC | PRN
  Administered 2018-05-09 (×3): 0.5 mg via INTRAVENOUS

## 2018-05-09 MED ORDER — HYDROCODONE-ACETAMINOPHEN 5-325 MG PO TABS
1.0000 | ORAL_TABLET | ORAL | Status: DC | PRN
  Administered 2018-05-09: 1 via ORAL
  Administered 2018-05-09 (×2): 2 via ORAL
  Administered 2018-05-10 (×2): 1 via ORAL
  Filled 2018-05-09 (×2): qty 1
  Filled 2018-05-09 (×2): qty 2
  Filled 2018-05-09: qty 1

## 2018-05-09 MED ORDER — CEFAZOLIN SODIUM-DEXTROSE 2-4 GM/100ML-% IV SOLN
2.0000 g | INTRAVENOUS | Status: AC
  Administered 2018-05-09: 2 g via INTRAVENOUS

## 2018-05-09 MED ORDER — RIVAROXABAN 10 MG PO TABS
10.0000 mg | ORAL_TABLET | ORAL | Status: DC
  Administered 2018-05-10: 10 mg via ORAL
  Filled 2018-05-09: qty 1

## 2018-05-09 MED ORDER — SODIUM CHLORIDE 0.9 % IV SOLN
INTRAVENOUS | Status: DC
  Administered 2018-05-09 (×2): via INTRAVENOUS

## 2018-05-09 MED ORDER — PROPOFOL 10 MG/ML IV BOLUS
INTRAVENOUS | Status: DC | PRN
  Administered 2018-05-09: 100 mg via INTRAVENOUS

## 2018-05-09 MED ORDER — METHOCARBAMOL 500 MG PO TABS
500.0000 mg | ORAL_TABLET | Freq: Four times a day (QID) | ORAL | Status: DC | PRN
  Administered 2018-05-09 – 2018-05-10 (×2): 500 mg via ORAL
  Filled 2018-05-09 (×2): qty 1

## 2018-05-09 MED ORDER — METOCLOPRAMIDE HCL 5 MG/ML IJ SOLN: 5.0000 mg | Freq: Three times a day (TID) | INTRAMUSCULAR | Status: DC | PRN

## 2018-05-09 MED ORDER — DEXAMETHASONE SODIUM PHOSPHATE 10 MG/ML IJ SOLN
10.0000 mg | Freq: Once | INTRAMUSCULAR | Status: AC
  Administered 2018-05-09: 10 mg via INTRAVENOUS

## 2018-05-09 MED ORDER — LIDOCAINE 2% (20 MG/ML) 5 ML SYRINGE
INTRAMUSCULAR | Status: DC | PRN
  Administered 2018-05-09: 50 mg via INTRAVENOUS

## 2018-05-09 MED ORDER — SODIUM CHLORIDE 0.9 % IR SOLN
Status: DC | PRN
  Administered 2018-05-09: 1000 mL

## 2018-05-09 MED ORDER — OXYCODONE HCL 5 MG PO TABS: 5.0000 mg | ORAL_TABLET | Freq: Once | ORAL | Status: DC | PRN

## 2018-05-09 MED ORDER — METOCLOPRAMIDE HCL 5 MG PO TABS: 5.0000 mg | ORAL_TABLET | Freq: Three times a day (TID) | ORAL | Status: DC | PRN

## 2018-05-09 MED ORDER — HYDROMORPHONE HCL 1 MG/ML IJ SOLN: 0.5000 mg | INTRAMUSCULAR | Status: DC | PRN

## 2018-05-09 MED ORDER — DOCUSATE SODIUM 100 MG PO CAPS
100.0000 mg | ORAL_CAPSULE | Freq: Two times a day (BID) | ORAL | Status: DC
  Administered 2018-05-10: 100 mg via ORAL
  Filled 2018-05-09: qty 1

## 2018-05-09 MED ORDER — LACTATED RINGERS IV SOLN
INTRAVENOUS | Status: DC
  Administered 2018-05-09 (×2): via INTRAVENOUS

## 2018-05-09 MED ORDER — DEXAMETHASONE SODIUM PHOSPHATE 10 MG/ML IJ SOLN
10.0000 mg | Freq: Once | INTRAMUSCULAR | Status: AC
  Administered 2018-05-10: 10 mg via INTRAVENOUS
  Filled 2018-05-09: qty 1

## 2018-05-09 MED ORDER — PROMETHAZINE HCL 25 MG/ML IJ SOLN: 6.2500 mg | INTRAMUSCULAR | Status: DC | PRN

## 2018-05-09 MED ORDER — AMLODIPINE BESYLATE 5 MG PO TABS
5.0000 mg | ORAL_TABLET | Freq: Every day | ORAL | Status: DC
  Administered 2018-05-10: 5 mg via ORAL
  Filled 2018-05-09: qty 1

## 2018-05-09 MED ORDER — TRANEXAMIC ACID-NACL 1000-0.7 MG/100ML-% IV SOLN
1000.0000 mg | Freq: Once | INTRAVENOUS | Status: AC
  Administered 2018-05-09: 1000 mg via INTRAVENOUS
  Filled 2018-05-09: qty 100

## 2018-05-09 MED ORDER — HYDROMORPHONE HCL 2 MG/ML IJ SOLN
INTRAMUSCULAR | Status: AC
  Filled 2018-05-09: qty 1

## 2018-05-09 MED ORDER — DIPHENHYDRAMINE HCL 12.5 MG/5ML PO ELIX: 12.5000 mg | ORAL_SOLUTION | ORAL | Status: DC | PRN

## 2018-05-09 MED ORDER — HYDROCODONE-ACETAMINOPHEN 7.5-325 MG PO TABS: 1.0000 | ORAL_TABLET | ORAL | Status: DC | PRN

## 2018-05-09 MED ORDER — PHENYLEPHRINE HCL 10 MG/ML IJ SOLN
INTRAMUSCULAR | Status: AC
  Filled 2018-05-09: qty 1

## 2018-05-09 MED ORDER — METHOCARBAMOL 500 MG IVPB - SIMPLE MED
500.0000 mg | Freq: Four times a day (QID) | INTRAVENOUS | Status: DC | PRN
  Administered 2018-05-09: 500 mg via INTRAVENOUS
  Filled 2018-05-09: qty 50

## 2018-05-09 MED ORDER — MENTHOL 3 MG MT LOZG: 1.0000 | LOZENGE | OROMUCOSAL | Status: DC | PRN

## 2018-05-09 MED ORDER — GLYCOPYRROLATE PF 0.2 MG/ML IJ SOSY
PREFILLED_SYRINGE | INTRAMUSCULAR | Status: DC | PRN
  Administered 2018-05-09: .2 mg via INTRAVENOUS

## 2018-05-09 MED ORDER — OXYCODONE HCL 5 MG/5ML PO SOLN
5.0000 mg | Freq: Once | ORAL | Status: DC | PRN
  Filled 2018-05-09: qty 5

## 2018-05-09 MED ORDER — FLECAINIDE ACETATE 50 MG PO TABS
50.0000 mg | ORAL_TABLET | Freq: Every day | ORAL | Status: DC
  Administered 2018-05-10: 50 mg via ORAL
  Filled 2018-05-09: qty 1

## 2018-05-09 SURGICAL SUPPLY — 45 items
ARTICULEZE HEAD (Hips) ×3 IMPLANT
BAG DECANTER FOR FLEXI CONT (MISCELLANEOUS) IMPLANT
BAG ZIPLOCK 12X15 (MISCELLANEOUS) IMPLANT
BLADE SAG 18X100X1.27 (BLADE) IMPLANT
BLADE SAW SGTL 18X1.27X75 (BLADE) ×2 IMPLANT
BLADE SAW SGTL 18X1.27X75MM (BLADE) ×1
BLADE SURG SZ10 CARB STEEL (BLADE) ×6 IMPLANT
COVER PERINEAL POST (MISCELLANEOUS) ×3 IMPLANT
COVER SURGICAL LIGHT HANDLE (MISCELLANEOUS) ×3 IMPLANT
COVER WAND RF STERILE (DRAPES) ×3 IMPLANT
CUP ACETBLR 52 OD PINNACLE (Hips) ×3 IMPLANT
DERMABOND ADVANCED (GAUZE/BANDAGES/DRESSINGS) ×2
DERMABOND ADVANCED .7 DNX12 (GAUZE/BANDAGES/DRESSINGS) ×1 IMPLANT
DRAPE STERI IOBAN 125X83 (DRAPES) ×3 IMPLANT
DRAPE U-SHAPE 47X51 STRL (DRAPES) ×6 IMPLANT
DRESSING AQUACEL AG SP 3.5X10 (GAUZE/BANDAGES/DRESSINGS) ×1 IMPLANT
DRSG AQUACEL AG ADV 3.5X10 (GAUZE/BANDAGES/DRESSINGS) ×3 IMPLANT
DRSG AQUACEL AG SP 3.5X10 (GAUZE/BANDAGES/DRESSINGS) ×3
DURAPREP 26ML APPLICATOR (WOUND CARE) ×3 IMPLANT
ELECT REM PT RETURN 15FT ADLT (MISCELLANEOUS) ×3 IMPLANT
ELIMINATOR HOLE APEX DEPUY (Hips) ×3 IMPLANT
GLOVE BIOGEL M STRL SZ7.5 (GLOVE) ×3 IMPLANT
GLOVE BIOGEL PI IND STRL 7.5 (GLOVE) ×1 IMPLANT
GLOVE BIOGEL PI IND STRL 8.5 (GLOVE) ×1 IMPLANT
GLOVE BIOGEL PI INDICATOR 7.5 (GLOVE) ×2
GLOVE BIOGEL PI INDICATOR 8.5 (GLOVE) ×2
GLOVE ECLIPSE 8.0 STRL XLNG CF (GLOVE) ×6 IMPLANT
GLOVE ORTHO TXT STRL SZ7.5 (GLOVE) ×3 IMPLANT
GOWN STRL REUS W/TWL 2XL LVL3 (GOWN DISPOSABLE) ×3 IMPLANT
GOWN STRL REUS W/TWL LRG LVL3 (GOWN DISPOSABLE) ×3 IMPLANT
HEAD ARTICULEZE (Hips) ×1 IMPLANT
HOLDER FOLEY CATH W/STRAP (MISCELLANEOUS) ×3 IMPLANT
LINER NEUTRAL 52X36MM PLUS 4 (Liner) ×3 IMPLANT
PACK ANTERIOR HIP CUSTOM (KITS) ×3 IMPLANT
SCREW PINN CAN BONE 6.5MMX15MM (Screw) ×3 IMPLANT
STEM FEM ACTIS HIGH SZ7 (Stem) ×3 IMPLANT
SUT MNCRL AB 4-0 PS2 18 (SUTURE) ×3 IMPLANT
SUT STRATAFIX 0 PDS 27 VIOLET (SUTURE) ×3
SUT VIC AB 1 CT1 36 (SUTURE) ×9 IMPLANT
SUT VIC AB 2-0 CT1 27 (SUTURE) ×4
SUT VIC AB 2-0 CT1 TAPERPNT 27 (SUTURE) ×2 IMPLANT
SUTURE STRATFX 0 PDS 27 VIOLET (SUTURE) ×1 IMPLANT
TRAY FOLEY MTR SLVR 14FR STAT (SET/KITS/TRAYS/PACK) ×3 IMPLANT
WATER STERILE IRR 1000ML POUR (IV SOLUTION) ×3 IMPLANT
YANKAUER SUCT BULB TIP 10FT TU (MISCELLANEOUS) ×3 IMPLANT

## 2018-05-09 NOTE — Evaluation (Signed)
Physical Therapy Evaluation Patient Details Name: Karina Tran MRN: [id number] DOB: 1940/06/04 Today's Date: 05/09/2018   History of Present Illness  78 yo female s/p R DA-THA on 05/09/18. PMH includes ischemic hepatitis, coagulopathy, sepsis, ILD, dyspnea, HTN, thoracoplasty, breast cancer, afib with cardioversion and ablation, HLD, lumbar spinal stenosis, L THA 2019.  Clinical Impression   Pt presents with R hip pain, increased time and effort to perform mobility tasks, post-operative R hip weakness, and decreased tolerance for ambulation due to pain. Pt to benefit from acute PT to address deficits. Pt ambulated 90 ft with RW with min guard assist, with verbal cuing utilized throughout. Pt educated on ankle pumps (20/hour) to perform this afternoon/evening to increase circulation, to pt's tolerance and limited by pain. PT to progress mobility as tolerated, and will continue to follow acutely.        Follow Up Recommendations Follow surgeon's recommendation for DC plan and follow-up therapies;Supervision for mobility/OOB    Equipment Recommendations  None recommended by PT    Recommendations for Other Services       Precautions / Restrictions Precautions Precautions: Fall Restrictions Weight Bearing Restrictions: No RLE Weight Bearing: Weight bearing as tolerated      Mobility  Bed Mobility Overal bed mobility: Needs Assistance Bed Mobility: Supine to Sit     Supine to sit: Min assist;HOB elevated     General bed mobility comments: Min assist for RLE translation and lifting, trunk elevation. Verbal cuing for sequencing, increased time and effort.   Transfers Overall transfer level: Needs assistance Equipment used: Rolling walker (2 wheeled) Transfers: Sit to/from Stand Sit to Stand: Min guard;From elevated surface         General transfer comment: Min guard for safety, verbal cuing for hand placement.   Ambulation/Gait Ambulation/Gait assistance: Min  guard;+2 safety/equipment(chair follow) Gait Distance (Feet): 90 Feet Assistive device: Rolling walker (2 wheeled) Gait Pattern/deviations: Step-to pattern;Step-through pattern;Decreased weight shift to right;Decreased stride length Gait velocity: decr    General Gait Details: min gaurd for safety. Verbal cuing for sequencing and step-to gait but pt quickly progressed to step-through gait, placement in RW, turning.   Stairs            Wheelchair Mobility    Modified Rankin (Stroke Patients Only)       Balance Overall balance assessment: Mild deficits observed, not formally tested                                           Pertinent Vitals/Pain Pain Assessment: 0-10 Pain Score: 5  Pain Location: R hip  Pain Descriptors / Indicators: Aching Pain Intervention(s): Repositioned;Limited activity within patient's tolerance;Ice applied;Monitored during session    [place] expects to be discharged to:: Private residence Living Arrangements: Children(daughter, son-in-law) Available Help at Discharge: Family;Available 24 hours/day Type of Home: House Home Access: Stairs to enter Entrance Stairs-Rails: Right;Left;Can reach both Entrance Stairs-Number of Steps: 4 +1 (no rail with the one step, very high step) Home Layout: Laundry or work area in basement;Two level;Able to live on main level with bedroom/bathroom Home Equipment: Gilford Rile - 2 wheels;Cane - single point;Bedside commode;Shower seat - built in;Hand held shower head;Grab bars - tub/shower      Prior Function Level of Independence: Independent         Comments: community ambulator, drives     Hand Dominance   Dominant Hand: Right  Extremity/Trunk Assessment   Upper Extremity Assessment Upper Extremity Assessment: Overall WFL for tasks assessed    Lower Extremity Assessment Lower Extremity Assessment: Overall WFL for tasks assessed;RLE deficits/detail RLE Deficits /  Details: suspected post-surgical hip weakness    Cervical / Trunk Assessment Cervical / Trunk Assessment: Normal  Communication   Communication: No difficulties  Cognition Arousal/Alertness: Awake/alert Behavior During Therapy: WFL for tasks assessed/performed Overall Cognitive Status: Within Functional Limits for tasks assessed                                        General Comments      Exercises     Assessment/Plan    PT Assessment Patient needs continued PT services  PT Problem List Decreased strength;Pain;Decreased activity tolerance;Decreased knowledge of use of DME;Decreased balance;Decreased mobility       PT Treatment Interventions DME instruction;Therapeutic activities;Gait training;Therapeutic exercise;Patient/family education;Stair training;Balance training;Functional mobility training    PT Goals (Current goals can be found in the Care Plan section)  Acute Rehab PT Goals Patient Stated Goal: none stated  PT Goal Formulation: With patient Time For Goal Achievement: 05/16/18 Potential to Achieve Goals: Good    Frequency 7X/week   Barriers to discharge        Co-evaluation               AM-PAC PT "6 Clicks" Mobility  Outcome Measure Help needed turning from your back to your side while in a flat bed without using bedrails?: A Little Help needed moving from lying on your back to sitting on the side of a flat bed without using bedrails?: A Little Help needed moving to and from a bed to a chair (including a wheelchair)?: A Little Help needed standing up from a chair using your arms (e.g., wheelchair or bedside chair)?: A Little Help needed to walk in hospital room?: A Little Help needed climbing 3-5 steps with a railing? : A Little 6 Click Score: 18    End of Session Equipment Utilized During Treatment: Gait belt Activity Tolerance: Patient tolerated treatment well Patient left: in chair;with chair alarm set;with call bell/phone  within reach;with family/visitor present;with SCD's reapplied Nurse Communication: Mobility status PT Visit Diagnosis: Other abnormalities of gait and mobility (R26.89);Difficulty in walking, not elsewhere classified (R26.2)    Time: 7858-8502 PT Time Calculation (min) (ACUTE ONLY): 33 min   Charges:   PT Evaluation $PT Eval Low Complexity: 1 Low PT Treatments $Gait Training: 8-22 mins        Julien Girt, PT Acute Rehabilitation Services Pager [phone]  Office [place] 05/09/2018, 6:37 PM

## 2018-05-09 NOTE — Discharge Instructions (Signed)

## 2018-05-09 NOTE — Op Note (Signed)
NAME:  Karina Tran                ACCOUNT NO.: [id number]      MEDICAL RECORD NO.: [id number]      FACILITY:  [facility]      PHYSICIAN:  [name]  DATE OF BIRTH:  Mar 22, 1941     DATE OF PROCEDURE:  05/09/2018                                 OPERATIVE REPORT         PREOPERATIVE DIAGNOSIS: Right  hip osteoarthritis.      POSTOPERATIVE DIAGNOSIS:  Right hip osteoarthritis.      PROCEDURE:  Right total hip replacement through an anterior approach   utilizing DePuy THR system, component size 59mm pinnacle cup, a size 36+4 neutral   Altrex liner, a size 7 Hi Actis femoral stem with a 36+5 Articulze metal   ball.      SURGEON:  Pietro Cassis. Alvan Dame, M.D.      ASSISTANT:  Griffith Citron, PA-C     ANESTHESIA:  General.      SPECIMENS:  None.      COMPLICATIONS:  None.      BLOOD LOSS:  300 cc     DRAINS:  None.      INDICATION OF THE PROCEDURE:  Karina Tran is a 78 y.o. female who had   presented to office for evaluation of right hip pain.  Radiographs revealed   progressive degenerative changes with bone-on-bone   articulation of the  hip joint, including subchondral cystic changes and osteophytes.  The patient had painful limited range of   motion significantly affecting their overall quality of life and function.  The patient was failing to    respond to conservative measures including medications and/or injections and activity modification and at this point was ready   to proceed with more definitive measures.  Consent was obtained for   benefit of pain relief.  Specific risks of infection, DVT, component   failure, dislocation, neurovascular injury, and need for revision surgery were reviewed in the office as well discussion of   the anterior versus posterior approach were reviewed.     PROCEDURE IN DETAIL:  The patient was brought to operative theater.   Once adequate anesthesia, preoperative antibiotics, 2 gm of Ancef, 1 gm of  Tranexamic Acid, and 10 mg of Decadron were administered, the patient was positioned supine on the [employer] table.  Once the patient was safely positioned with adequate padding of boney prominences we predraped out the hip, and used fluoroscopy to confirm orientation of the pelvis.      The right hip was then prepped and draped from proximal iliac crest to   mid thigh with a shower curtain technique.      Time-out was performed identifying the patient, planned procedure, and the appropriate extremity.     An incision was then made 2 cm lateral to the   anterior superior iliac spine extending over the orientation of the   tensor fascia lata muscle and sharp dissection was carried down to the   fascia of the muscle.      The fascia was then incised.  The muscle belly was identified and swept   laterally and retractor placed along the superior neck.  Following   cauterization of the circumflex vessels and removing some  pericapsular   fat, a second cobra retractor was placed on the inferior neck.  A T-capsulotomy was made along the line of the   superior neck to the trochanteric fossa, then extended proximally and   distally.  Tag sutures were placed and the retractors were then placed   intracapsular.  We then identified the trochanteric fossa and   orientation of my neck cut and then made a neck osteotomy with the femur on traction.  The femoral   head was removed without difficulty or complication.  Traction was let   off and retractors were placed posterior and anterior around the   acetabulum.      The labrum and foveal tissue were debrided.  I began reaming with a 45 mm   reamer and reamed up to 51 mm reamer with good bony bed preparation and a 52 mm  cup was chosen.  The final 52 mm Pinnacle cup was then impacted under fluoroscopy to confirm the depth of penetration and orientation with respect to   Abduction and forward flexion.  A screw was placed into the ilium followed by the hole  eliminator.  The final   36+4 neutral Altrex liner was impacted with good visualized rim fit.  The cup was positioned anatomically within the acetabular portion of the pelvis.      At this point, the femur was rolled to 100 degrees.  Further capsule was   released off the inferior aspect of the femoral neck.  I then   released the superior capsule proximally.  With the leg in a neutral position the hook was placed laterally   along the femur under the vastus lateralis origin and elevated manually and then held in position using the hook attachment on the bed.  The leg was then extended and adducted with the leg rolled to 100   degrees of external rotation.  Retractors were placed along the medial calcar and posteriorly over the greater trochanter.  Once the proximal femur was fully   exposed, I used a box osteotome to set orientation.  I then began   broaching with the starting chili pepper broach and passed this by hand and then broached up to 7 .  With the 7 broach in place I chose a high offset neck and did several trial reductions.  The offset was appropriate, leg lengths   appeared to be equal best matched with the +5 head ball trial confirmed radiographically.   Given these findings, I went ahead and dislocated the hip, repositioned all   retractors and positioned the right hip in the extended and abducted position.  The final 7 Hi Actis femoral stem was   chosen and it was impacted down to the level of neck cut.  Based on this   and the trial reductions, a final 36+5 Articuleze metal ball was chosen and   impacted onto a clean and dry trunnion, and the hip was reduced.  The   hip had been irrigated throughout the case again at this point.  I did   reapproximate the superior capsular leaflet to the anterior leaflet   using #1 Vicryl.  The fascia of the   tensor fascia lata muscle was then reapproximated using #1 Vicryl and #0 Stratafix sutures.  The   remaining wound was closed with 2-0  Vicryl and running 4-0 Monocryl.   The hip was cleaned, dried, and dressed sterilely using Dermabond and   Aquacel dressing.  The patient was then brought  to recovery room in stable condition tolerating the procedure well.    Griffith Citron, PA-C was present for the entirety of the case involved from   preoperative positioning, perioperative retractor management, general   facilitation of the case, as well as primary wound closure as assistant.            Pietro Cassis Alvan Dame, M.D.        05/09/2018 8:35 AM

## 2018-05-09 NOTE — Transfer of Care (Signed)
Immediate Anesthesia Transfer of Care Note  Patient: Karina Tran  Procedure(s) Performed: Procedure(s) with comments: TOTAL HIP ARTHROPLASTY ANTERIOR APPROACH (Right) - 70 minutes  Patient Location: PACU  Anesthesia Type:General  Level of Consciousness:  sedated, patient cooperative and responds to stimulation  Airway & Oxygen Therapy:Patient Spontanous Breathing and Patient connected to face mask oxgen  Post-op Assessment:  Report given to PACU RN and Post -op Vital signs reviewed and stable  Post vital signs:  Reviewed and stable  Last Vitals:  Vitals:   05/09/18 0555  BP: (!) 151/81  Pulse: 69  Resp: 14  Temp: 36.6 C  SpO2: 583%    Complications: No apparent anesthesia complications

## 2018-05-09 NOTE — Anesthesia Procedure Notes (Signed)
Procedure Name: Intubation Date/Time: 05/09/2018 7:13 AM Performed by: Anne Fu, CRNA Pre-anesthesia Checklist: Patient identified, Emergency Drugs available, Suction available, Patient being monitored and Timeout performed Patient Re-evaluated:Patient Re-evaluated prior to induction Oxygen Delivery Method: Circle system utilized Preoxygenation: Pre-oxygenation with 100% oxygen Induction Type: IV induction Ventilation: Mask ventilation without difficulty Laryngoscope Size: Mac and 4 Grade View: Grade I Tube type: Oral Tube size: 7.5 mm Number of attempts: 1 Airway Equipment and Method: Stylet Placement Confirmation: ETT inserted through vocal cords under direct vision,  positive ETCO2 and breath sounds checked- equal and bilateral Secured at: 21 cm Tube secured with: Tape Dental Injury: Teeth and Oropharynx as per pre-operative assessment

## 2018-05-09 NOTE — Interval H&P Note (Signed)
History and Physical Interval Note:  05/09/2018 6:56 AM  Karina Tran  has presented today for surgery, with the diagnosis of right hip osteoarthritis  The various methods of treatment have been discussed with the patient and family. After consideration of risks, benefits and other options for treatment, the patient has consented to  Procedure(s) with comments: [place] (Right) - 70 minutes as a surgical intervention .  The patient's history has been reviewed, patient examined, no change in status, stable for surgery.  I have reviewed the patient's chart and labs.  Questions were answered to the patient's satisfaction.     [name]

## 2018-05-10 ENCOUNTER — Encounter (HOSPITAL_COMMUNITY): Payer: Self-pay | Admitting: Orthopedic Surgery

## 2018-05-10 DIAGNOSIS — M1611 Unilateral primary osteoarthritis, right hip: Secondary | ICD-10-CM | POA: Diagnosis not present

## 2018-05-10 LAB — CBC
HCT: 36.8 % (ref 36.0–46.0)
Hemoglobin: 11.5 g/dL — ABNORMAL LOW (ref 12.0–15.0)
MCH: 29 pg (ref 26.0–34.0)
MCHC: 31.3 g/dL (ref 30.0–36.0)
MCV: 92.9 fL (ref 80.0–100.0)
Platelets: 243 10*3/uL (ref 150–400)
RBC: 3.96 MIL/uL (ref 3.87–5.11)
RDW: 14 % (ref 11.5–15.5)
WBC: 16.9 10*3/uL — ABNORMAL HIGH (ref 4.0–10.5)
nRBC: 0 % (ref 0.0–0.2)

## 2018-05-10 LAB — BASIC METABOLIC PANEL
Anion gap: 7 (ref 5–15)
BUN: 15 mg/dL (ref 8–23)
CHLORIDE: 101 mmol/L (ref 98–111)
CO2: 27 mmol/L (ref 22–32)
Calcium: 8.4 mg/dL — ABNORMAL LOW (ref 8.9–10.3)
Creatinine, Ser: 0.68 mg/dL (ref 0.44–1.00)
GFR calc Af Amer: >60 mL/min (ref >60)
GFR calc non Af Amer: >60 mL/min (ref >60)
Glucose, Bld: 142 mg/dL — ABNORMAL HIGH (ref 70–99)
Potassium: 3.2 mmol/L — ABNORMAL LOW (ref 3.5–5.1)
SODIUM: 135 mmol/L (ref 135–145)

## 2018-05-10 MED ORDER — METHOCARBAMOL 500 MG PO TABS: 500.0000 mg | ORAL_TABLET | Freq: Four times a day (QID) | ORAL | 0 refills | Status: DC | PRN

## 2018-05-10 MED ORDER — POLYETHYLENE GLYCOL 3350 17 G PO PACK: 17.0000 g | PACK | Freq: Two times a day (BID) | ORAL | 0 refills | Status: DC

## 2018-05-10 MED ORDER — DOCUSATE SODIUM 100 MG PO CAPS: 100.0000 mg | ORAL_CAPSULE | Freq: Two times a day (BID) | ORAL | 0 refills | Status: AC

## 2018-05-10 MED ORDER — HYDROCODONE-ACETAMINOPHEN 5-325 MG PO TABS: 1.0000 | ORAL_TABLET | ORAL | 0 refills | Status: DC | PRN

## 2018-05-10 MED ORDER — FERROUS SULFATE 325 (65 FE) MG PO TABS: 325.0000 mg | ORAL_TABLET | Freq: Three times a day (TID) | ORAL | 3 refills | Status: AC

## 2018-05-10 MED ORDER — POTASSIUM CHLORIDE CRYS ER 20 MEQ PO TBCR
40.0000 meq | EXTENDED_RELEASE_TABLET | Freq: Two times a day (BID) | ORAL | Status: DC
  Administered 2018-05-10: 40 meq via ORAL
  Filled 2018-05-10: qty 2

## 2018-05-10 NOTE — Progress Notes (Signed)
Physical Therapy Treatment Patient Details Name: Karina Tran MRN: [id number] DOB: Aug 06, 1940 Today's Date: 05/10/2018    History of Present Illness 78 yo female s/p R DA-THA on 05/09/18. PMH includes ischemic hepatitis, coagulopathy, sepsis, ILD, dyspnea, HTN, thoracoplasty, breast cancer, afib with cardioversion and ablation, HLD, lumbar spinal stenosis, L THA 2019.    PT Comments    Pt eager and ready for PT, wishes to d/c asap.  Pt ambulated in hallway and practiced steps.  Pt also performed LE exercises and provided with HEP handout.  Pt feels ready for d/c home and had no further questions.   Follow Up Recommendations  Follow surgeon's recommendation for DC plan and follow-up therapies;Supervision for mobility/OOB     Equipment Recommendations  None recommended by PT    Recommendations for Other Services       Precautions / Restrictions Precautions Precautions: Fall Restrictions RLE Weight Bearing: Weight bearing as tolerated    Mobility  Bed Mobility Overal bed mobility: Needs Assistance Bed Mobility: Supine to Sit;Sit to Supine     Supine to sit: Min guard;HOB elevated Sit to supine: Min guard;HOB elevated   General bed mobility comments: pt self assisted R LE  Transfers Overall transfer level: Needs assistance Equipment used: Rolling walker (2 wheeled) Transfers: Sit to/from Stand Sit to Stand: Min guard;Supervision         General transfer comment: verbal cues for safety  Ambulation/Gait Ambulation/Gait assistance: Min guard;Supervision Gait Distance (Feet): 180 Feet Assistive device: Rolling walker (2 wheeled) Gait Pattern/deviations: Step-to pattern;Step-through pattern;Decreased weight shift to right;Decreased stride length;Antalgic Gait velocity: decr    General Gait Details: verbal cues for sequence, RW positioning, posture, pt tends to keep RW too far forward   Stairs Stairs: Yes Stairs assistance: Min guard Stair Management: Step  to pattern;Forwards;Two rails Number of Stairs: 3 General stair comments: verbal cues for safe technique and sequence; also performed with one step forwards with RW; pt performing steps well and has no questions.   Wheelchair Mobility    Modified Rankin (Stroke Patients Only)       Balance                                            Cognition Arousal/Alertness: Awake/alert Behavior During Therapy: WFL for tasks assessed/performed Overall Cognitive Status: Within Functional Limits for tasks assessed                                        Exercises Total Joint Exercises Ankle Circles/Pumps: AROM;10 reps;Both Quad Sets: AROM;10 reps;Both Heel Slides: 10 reps;AROM;Supine;Right Hip ABduction/ADduction: 10 reps;AROM;Standing;Supine;Right Long Arc Quad: AROM;10 reps;Seated;Right Knee Flexion: AROM;10 reps;Standing;Right Marching in Standing: AROM;10 reps;Standing;Right Standing Hip Extension: AROM;10 reps;Standing;Right    General Comments        Pertinent Vitals/Pain Pain Assessment: 0-10 Pain Score: 4  Pain Location: R hip  Pain Descriptors / Indicators: Sore Pain Intervention(s): Limited activity within patient's tolerance;Premedicated before session;Monitored during session;Repositioned    Home Living                      Prior Function            PT Goals (current goals can now be found in the care plan section) Progress towards PT goals: Progressing toward goals  Frequency    7X/week      PT Plan Current plan remains appropriate    Co-evaluation              AM-PAC PT "6 Clicks" Mobility   Outcome Measure  Help needed turning from your back to your side while in a flat bed without using bedrails?: None Help needed moving from lying on your back to sitting on the side of a flat bed without using bedrails?: A Little Help needed moving to and from a bed to a chair (including a wheelchair)?: A  Little Help needed standing up from a chair using your arms (e.g., wheelchair or bedside chair)?: A Little Help needed to walk in hospital room?: A Little Help needed climbing 3-5 steps with a railing? : A Little 6 Click Score: 19    End of Session Equipment Utilized During Treatment: Gait belt Activity Tolerance: Patient tolerated treatment well Patient left: with call bell/phone within reach;in bed Nurse Communication: Mobility status PT Visit Diagnosis: Other abnormalities of gait and mobility (R26.89);Difficulty in walking, not elsewhere classified (R26.2)     Time: 6203-5597 PT Time Calculation (min) (ACUTE ONLY): 25 min  Charges:  $Gait Training: 8-22 mins $Therapeutic Exercise: 8-22 mins                    Carmelia Bake, PT, DPT Acute Rehabilitation Services Office: [phone] Pager: [phone]  Trena Platt 05/10/2018, 11:50 AM

## 2018-05-10 NOTE — Progress Notes (Signed)
Patient discharged to home w/ family. Given all belongings, instructions. Prescriptions called in, equipment at home. Patient and family verbalized understanding of instructions. Escorted to pov via w/c.

## 2018-05-10 NOTE — Plan of Care (Signed)
°  Problem: Activity: °Goal: Risk for activity intolerance will decrease °Outcome: Progressing °  °Problem: Elimination: °Goal: Will not experience complications related to bowel motility °Outcome: Progressing °  °Problem: Elimination: °Goal: Will not experience complications related to urinary retention °Outcome: Progressing °  °Problem: Pain Managment: °Goal: General experience of comfort will improve °Outcome: Progressing °  °

## 2018-05-10 NOTE — Progress Notes (Signed)
     Subjective: 1 Day Post-Op Procedure(s) (LRB): TOTAL HIP ARTHROPLASTY ANTERIOR APPROACH (Right)   Patient reports pain as mild, pain controlled. No events throughout the night. Feels that she is doing well Ready to be discharged home.     Objective:   VITALS:   Vitals:   05/10/18 0156 05/10/18 0525  BP: 113/61 (!) 117/48  Pulse: 65 60  Resp: 16 16  Temp: 98.1 F (36.7 C) 97.8 F (36.6 C)  SpO2: 100% 100%    Dorsiflexion/Plantar flexion intact Incision: dressing C/D/I No cellulitis present Compartment soft  LABS Recent Labs    05/10/18 0501  HGB 11.5*  HCT 36.8  WBC 16.9*  PLT 243    Recent Labs    05/10/18 0501  NA 135  K 3.2*  BUN 15  CREATININE 0.68  GLUCOSE 142*     Assessment/Plan: 1 Day Post-Op Procedure(s) (LRB): TOTAL HIP ARTHROPLASTY ANTERIOR APPROACH (Right) Foley cath d/c'ed Advance diet Up with therapy D/C IV fluids Discharge home Follow up in 2 weeks at [facility] Follow up with OLIN,Marchetta Navratil D in 2 weeks.  Contact information:  [facility]) [address] [place]. Martita Brumm   PAC  05/10/2018, 8:20 AM

## 2018-05-11 NOTE — Anesthesia Postprocedure Evaluation (Signed)
Anesthesia Post Note  Patient: Geraldin Habermehl  Procedure(s) Performed: TOTAL HIP ARTHROPLASTY ANTERIOR APPROACH (Right Hip)     Patient location during evaluation: PACU Anesthesia Type: General Level of consciousness: awake and alert Pain management: pain level controlled Vital Signs Assessment: post-procedure vital signs reviewed and stable Respiratory status: spontaneous breathing, nonlabored ventilation, respiratory function stable and patient connected to nasal cannula oxygen Cardiovascular status: blood pressure returned to baseline and stable Postop Assessment: no apparent nausea or vomiting Anesthetic complications: no    Last Vitals:  Vitals:   05/10/18 0954 05/10/18 1003  BP:  (!) 124/59  Pulse: 67 74  Resp:  16  Temp:  36.8 C  SpO2:  100%    Last Pain:  Vitals:   05/10/18 1003  TempSrc: Oral  PainSc:                  Peyton Spengler S

## 2018-05-14 NOTE — Discharge Summary (Signed)
Physician Discharge Summary  Patient ID: Karina Tran MRN: [id number] DOB/AGE: 79-Sep-1942 79 y.o.  Admit date: 05/09/2018 Discharge date: 05/10/2018   Procedures:  Procedure(s) (LRB): TOTAL HIP ARTHROPLASTY ANTERIOR APPROACH (Right)  Attending Physician:  Dr. Paralee Cancel   Admission Diagnoses:   Right hip primary OA / pain  Discharge Diagnoses:  Principal Problem:   S/P right THA, AA  Past Medical History:  Diagnosis Date  . Allergic rhinitis   . Breast cancer ([place])    left  . Dyspnea    chronic with exertion   . Dysrhythmia    a fib  had ablation in [place]   . GERD (gastroesophageal reflux disease)   . 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 History of GI bleed    last episode 02-02-16; was due to hemorroids   . History of kidney infection   . History of pulmonary hypertension    mild ; (see ECHO result in LOV with Dr. Kela Millin 06-18-17 on chart )  pt. denies at preop  . HLD (hyperlipidemia)    denies   . Hypertension   . ILD (interstitial lung disease) ([place])   . Iron deficiency anemia    gets iv infusions  . Persistent atrial fibrillation    a. s/p PVI 2009 in [place]  . Raynaud phenomenon    no issues currently   . Spinal stenosis    lumbar     HPI:    Karina Tran, 78 y.o. female, has a history of pain and functional disability in the right hip(s) due to arthritis and patient has failed non-surgical conservative treatments for greater than 12 weeks to include NSAID's and/or analgesics, use of assistive devices and activity modification.  Onset of symptoms was abrupt starting 1+ years ago with gradually worsening course since that time.The patient noted prior procedures of the hip to include arthroplasty on the left hip per Dr. Alvan Dame 07/03/17.  Patient currently rates pain in the right hip at 5 out of 10 with activity. Patient has night pain, worsening of pain with activity and weight bearing, trendelenberg gait, pain that interfers with activities of daily living and  pain with passive range of motion. Patient has evidence of periarticular osteophytes and joint space narrowing by imaging studies. This condition presents safety issues increasing the risk of falls. There is no current active infection.  Risks, benefits and expectations were discussed with the patient.  Risks including but not limited to the risk of anesthesia, blood clots, nerve damage, blood vessel damage, failure of the prosthesis, infection and up to and including death.  Patient understand the risks, benefits and expectations and wishes to proceed with surgery.   PCP: Jettie Booze, NP   Discharged Condition: good  Hospital Course:  Patient underwent the above stated procedure on 05/09/2018. Patient tolerated the procedure well and brought to the recovery room in good condition and subsequently to the floor.  POD #1 BP: 117/48 ; Pulse: 60 ; Temp: 97.8 F (36.6 C) ; Resp: 16 Patient reports pain as mild, pain controlled. No events throughout the night. Feels that she is doing well Ready to be discharged home.  Dorsiflexion/plantar flexion intact, incision: dressing C/D/I, no cellulitis present and compartment soft.   LABS  Basename    HGB     11.5  HCT     36.8    Discharge Exam: General appearance: alert, cooperative and no distress Extremities: Homans sign is negative, no sign of DVT, no edema, redness or  tenderness in the calves or thighs and no ulcers, gangrene or trophic changes  Disposition:  Home with follow up in 2 weeks   Follow-up Information    Paralee Cancel, MD. Schedule an appointment as soon as possible for a visit in 2 weeks.   Specialty:  Orthopedic Surgery Contact information: [address] [place] [address] [phone]           Discharge Instructions    Call MD / Call 911   Complete by:  As directed    If you experience chest pain or shortness of breath, CALL 911 and be transported to the hospital emergency room.  If you develope  a fever above 101 F, pus (white drainage) or increased drainage or redness at the wound, or calf pain, call your surgeon's office.   Change dressing   Complete by:  As directed    Maintain surgical dressing until follow up in the clinic. If the edges start to pull up, may reinforce with tape. If the dressing is no longer working, may remove and cover with gauze and tape, but must keep the area dry and clean.  Call with any questions or concerns.   Constipation Prevention   Complete by:  As directed    Drink plenty of fluids.  Prune juice may be helpful.  You may use a stool softener, such as Colace (over the counter) 100 mg twice a day.  Use MiraLax (over the counter) for constipation as needed.   Diet - low sodium heart healthy   Complete by:  As directed    Discharge instructions   Complete by:  As directed    Maintain surgical dressing until follow up in the clinic. If the edges start to pull up, may reinforce with tape. If the dressing is no longer working, may remove and cover with gauze and tape, but must keep the area dry and clean.  Follow up in 2 weeks at [facility]. Call with any questions or concerns.   Increase activity slowly as tolerated   Complete by:  As directed    Weight bearing as tolerated with assist device (walker, cane, etc) as directed, use it as long as suggested by your surgeon or therapist, typically at least 4-6 weeks.   TED hose   Complete by:  As directed    Use stockings (TED hose) for 2 weeks on both leg(s).  You may remove them at night for sleeping.      Allergies as of 05/10/2018      Reactions   Aspirin Other (See Comments)   Bloody noses   Blue Dyes (parenteral) Nausea And Vomiting   Demerol [meperidine] Nausea And Vomiting      Medication List    STOP taking these medications   loperamide 2 MG capsule Commonly known as:  IMODIUM     TAKE these medications   amLODipine 5 MG tablet Commonly known as:  NORVASC Take 5 mg by mouth  daily.   atenolol 50 MG tablet Commonly known as:  TENORMIN Take 50 mg by mouth daily.   CALCIUM 600+D3 PO Take 1 tablet by mouth every morning.   Co Q10 200 MG Caps Take 200 mg by mouth every morning.   docusate sodium 100 MG capsule Commonly known as:  COLACE Take 1 capsule (100 mg total) by mouth 2 (two) times daily.   ferrous sulfate 325 (65 FE) MG tablet Commonly known as:  FERROUSUL Take 1 tablet (325 mg total) by  mouth 3 (three) times daily with meals.   flecainide 50 MG tablet Commonly known as:  TAMBOCOR Take 1 tablet (50 mg total) by mouth every 12 (twelve) hours. What changed:  when to take this   hydrochlorothiazide 25 MG tablet Commonly known as:  HYDRODIURIL Take 25 mg by mouth daily.   HYDROcodone-acetaminophen 5-325 MG tablet Commonly known as:  NORCO/VICODIN Take 1-2 tablets by mouth every 4 (four) hours as needed for moderate pain or severe pain.   methocarbamol 500 MG tablet Commonly known as:  ROBAXIN Take 1 tablet (500 mg total) by mouth every 6 (six) hours as needed for muscle spasms.   MULTIVITAMIN ADULT Chew Chew 2 each by mouth daily.   pantoprazole 40 MG tablet Commonly known as:  PROTONIX Take 40 mg by mouth every morning.   polyethylene glycol packet Commonly known as:  MIRALAX / GLYCOLAX Take 17 g by mouth 2 (two) times daily.   POTASSIUM GLUCONATE PO Take 75 mg by mouth daily.   rivaroxaban 20 MG Tabs tablet Commonly known as:  XARELTO Take 1 tablet (20 mg total) by mouth daily with supper. What changed:  when to take this            Discharge Care Instructions  (From admission, onward)         Start     Ordered   05/10/18 0000  Change dressing    Comments:  Maintain surgical dressing until follow up in the clinic. If the edges start to pull up, may reinforce with tape. If the dressing is no longer working, may remove and cover with gauze and tape, but must keep the area dry and clean.  Call with any questions or  concerns.   05/10/18 0825           Signed: [place] P[place]. Kijana Cromie   PA-C  05/14/2018, 10:56 AM

## 2018-06-04 ENCOUNTER — Other Ambulatory Visit: Payer: Self-pay | Admitting: Family Medicine

## 2018-06-04 DIAGNOSIS — Z1231 Encounter for screening mammogram for malignant neoplasm of breast: Secondary | ICD-10-CM

## 2018-06-11 ENCOUNTER — Telehealth: Payer: Self-pay

## 2018-06-18 ENCOUNTER — Other Ambulatory Visit: Payer: Self-pay

## 2018-06-18 MED ORDER — FLECAINIDE ACETATE 50 MG PO TABS: 50.0000 mg | ORAL_TABLET | Freq: Two times a day (BID) | ORAL | 0 refills | Status: AC

## 2018-06-20 ENCOUNTER — Ambulatory Visit: Payer: Self-pay | Admitting: Cardiology

## 2018-07-04 ENCOUNTER — Ambulatory Visit: Payer: Medicare Other

## 2018-07-15 ENCOUNTER — Ambulatory Visit: Payer: Self-pay | Admitting: Cardiology

## 2018-08-08 ENCOUNTER — Ambulatory Visit: Payer: Medicare Other

## 2018-10-01 ENCOUNTER — Ambulatory Visit
Admission: RE | Admit: 2018-10-01 | Discharge: 2018-10-01 | Disposition: A | Discharge status: Home / Self Care | Payer: Medicare Other | Source: Ambulatory Visit | Attending: Family Medicine | Admitting: Family Medicine

## 2018-10-01 ENCOUNTER — Other Ambulatory Visit: Payer: Self-pay

## 2018-10-01 DIAGNOSIS — Z1231 Encounter for screening mammogram for malignant neoplasm of breast: Secondary | ICD-10-CM

## 2018-11-12 ENCOUNTER — Inpatient Hospital Stay (HOSPITAL_COMMUNITY): Admission: EM | Disposition: E | Payer: Self-pay | Source: Home / Self Care | Attending: Pulmonary Disease

## 2018-11-12 ENCOUNTER — Encounter (HOSPITAL_COMMUNITY): Payer: Self-pay

## 2018-11-12 ENCOUNTER — Inpatient Hospital Stay (HOSPITAL_COMMUNITY): Payer: Medicare Other

## 2018-11-12 ENCOUNTER — Emergency Department (HOSPITAL_COMMUNITY): Payer: Medicare Other

## 2018-11-12 ENCOUNTER — Inpatient Hospital Stay (HOSPITAL_COMMUNITY)
Admission: EM | Admit: 2018-11-12 | Discharge: 2018-12-10 | DRG: 215 | Disposition: E | Payer: Medicare Other | Attending: Pulmonary Disease | Admitting: Pulmonary Disease

## 2018-11-12 DIAGNOSIS — I272 Pulmonary hypertension, unspecified: Secondary | ICD-10-CM | POA: Diagnosis present

## 2018-11-12 DIAGNOSIS — I959 Hypotension, unspecified: Secondary | ICD-10-CM

## 2018-11-12 DIAGNOSIS — R570 Cardiogenic shock: Secondary | ICD-10-CM | POA: Diagnosis present

## 2018-11-12 DIAGNOSIS — J9601 Acute respiratory failure with hypoxia: Secondary | ICD-10-CM | POA: Diagnosis not present

## 2018-11-12 DIAGNOSIS — Z66 Do not resuscitate: Secondary | ICD-10-CM | POA: Diagnosis present

## 2018-11-12 DIAGNOSIS — Z7901 Long term (current) use of anticoagulants: Secondary | ICD-10-CM

## 2018-11-12 DIAGNOSIS — R579 Shock, unspecified: Secondary | ICD-10-CM | POA: Diagnosis not present

## 2018-11-12 DIAGNOSIS — I4891 Unspecified atrial fibrillation: Secondary | ICD-10-CM

## 2018-11-12 DIAGNOSIS — R9389 Abnormal findings on diagnostic imaging of other specified body structures: Secondary | ICD-10-CM

## 2018-11-12 DIAGNOSIS — Z978 Presence of other specified devices: Secondary | ICD-10-CM

## 2018-11-12 DIAGNOSIS — Z20828 Contact with and (suspected) exposure to other viral communicable diseases: Secondary | ICD-10-CM | POA: Diagnosis not present

## 2018-11-12 DIAGNOSIS — I480 Paroxysmal atrial fibrillation: Secondary | ICD-10-CM

## 2018-11-12 DIAGNOSIS — I509 Heart failure, unspecified: Secondary | ICD-10-CM | POA: Diagnosis not present

## 2018-11-12 DIAGNOSIS — N179 Acute kidney failure, unspecified: Secondary | ICD-10-CM | POA: Diagnosis present

## 2018-11-12 DIAGNOSIS — I4819 Other persistent atrial fibrillation: Secondary | ICD-10-CM | POA: Diagnosis not present

## 2018-11-12 DIAGNOSIS — I469 Cardiac arrest, cause unspecified: Secondary | ICD-10-CM

## 2018-11-12 DIAGNOSIS — R9431 Abnormal electrocardiogram [ECG] [EKG]: Secondary | ICD-10-CM | POA: Diagnosis not present

## 2018-11-12 DIAGNOSIS — J8410 Pulmonary fibrosis, unspecified: Secondary | ICD-10-CM | POA: Diagnosis present

## 2018-11-12 DIAGNOSIS — J9621 Acute and chronic respiratory failure with hypoxia: Secondary | ICD-10-CM | POA: Diagnosis not present

## 2018-11-12 DIAGNOSIS — K219 Gastro-esophageal reflux disease without esophagitis: Secondary | ICD-10-CM | POA: Diagnosis present

## 2018-11-12 DIAGNOSIS — G9349 Other encephalopathy: Secondary | ICD-10-CM | POA: Diagnosis not present

## 2018-11-12 DIAGNOSIS — Z96643 Presence of artificial hip joint, bilateral: Secondary | ICD-10-CM | POA: Diagnosis present

## 2018-11-12 DIAGNOSIS — Z515 Encounter for palliative care: Secondary | ICD-10-CM | POA: Diagnosis not present

## 2018-11-12 DIAGNOSIS — I4901 Ventricular fibrillation: Secondary | ICD-10-CM | POA: Diagnosis present

## 2018-11-12 DIAGNOSIS — E872 Acidosis: Secondary | ICD-10-CM | POA: Diagnosis present

## 2018-11-12 DIAGNOSIS — Z8249 Family history of ischemic heart disease and other diseases of the circulatory system: Secondary | ICD-10-CM | POA: Diagnosis not present

## 2018-11-12 DIAGNOSIS — I130 Hypertensive heart and chronic kidney disease with heart failure and stage 1 through stage 4 chronic kidney disease, or unspecified chronic kidney disease: Secondary | ICD-10-CM | POA: Diagnosis not present

## 2018-11-12 DIAGNOSIS — Z853 Personal history of malignant neoplasm of breast: Secondary | ICD-10-CM

## 2018-11-12 DIAGNOSIS — I428 Other cardiomyopathies: Secondary | ICD-10-CM | POA: Diagnosis present

## 2018-11-12 DIAGNOSIS — I5021 Acute systolic (congestive) heart failure: Secondary | ICD-10-CM | POA: Diagnosis not present

## 2018-11-12 DIAGNOSIS — N183 Chronic kidney disease, stage 3 (moderate): Secondary | ICD-10-CM | POA: Diagnosis present

## 2018-11-12 DIAGNOSIS — Z79899 Other long term (current) drug therapy: Secondary | ICD-10-CM | POA: Diagnosis not present

## 2018-11-12 DIAGNOSIS — I472 Ventricular tachycardia: Secondary | ICD-10-CM | POA: Diagnosis not present

## 2018-11-12 DIAGNOSIS — Z923 Personal history of irradiation: Secondary | ICD-10-CM

## 2018-11-12 DIAGNOSIS — E785 Hyperlipidemia, unspecified: Secondary | ICD-10-CM | POA: Diagnosis not present

## 2018-11-12 DIAGNOSIS — R7989 Other specified abnormal findings of blood chemistry: Secondary | ICD-10-CM

## 2018-11-12 HISTORY — PX: RIGHT/LEFT HEART CATH AND CORONARY ANGIOGRAPHY: CATH118266

## 2018-11-12 HISTORY — PX: VENTRICULAR ASSIST DEVICE INSERTION: CATH118273

## 2018-11-12 LAB — CBC WITH DIFFERENTIAL/PLATELET
Abs Immature Granulocytes: 0.03 10*3/uL (ref 0.00–0.07)
Basophils Absolute: 0.1 10*3/uL (ref 0.0–0.1)
Basophils Relative: 1 %
Eosinophils Absolute: 0 10*3/uL (ref 0.0–0.5)
Eosinophils Relative: 0 %
HCT: 42.9 % (ref 36.0–46.0)
Hemoglobin: 13.7 g/dL (ref 12.0–15.0)
Immature Granulocytes: 0 %
Lymphocytes Relative: 7 %
Lymphs Abs: 0.6 10*3/uL — ABNORMAL LOW (ref 0.7–4.0)
MCH: 30.7 pg (ref 26.0–34.0)
MCHC: 31.9 g/dL (ref 30.0–36.0)
MCV: 96.2 fL (ref 80.0–100.0)
Monocytes Absolute: 1.1 10*3/uL — ABNORMAL HIGH (ref 0.1–1.0)
Monocytes Relative: 11 %
Neutro Abs: 7.8 10*3/uL — ABNORMAL HIGH (ref 1.7–7.7)
Neutrophils Relative %: 81 %
Platelets: 170 10*3/uL (ref 150–400)
RBC: 4.46 MIL/uL (ref 3.87–5.11)
RDW: 16 % — ABNORMAL HIGH (ref 11.5–15.5)
WBC: 9.6 10*3/uL (ref 4.0–10.5)
nRBC: 0 % (ref 0.0–0.2)

## 2018-11-12 LAB — COMPREHENSIVE METABOLIC PANEL
ALT: 37 U/L (ref 0–44)
AST: 50 U/L — ABNORMAL HIGH (ref 15–41)
Albumin: 3.3 g/dL — ABNORMAL LOW (ref 3.5–5.0)
Alkaline Phosphatase: 72 U/L (ref 38–126)
Anion gap: 20 — ABNORMAL HIGH (ref 5–15)
BUN: 8 mg/dL (ref 8–23)
CO2: 18 mmol/L — ABNORMAL LOW (ref 22–32)
Calcium: 9 mg/dL (ref 8.9–10.3)
Chloride: 97 mmol/L — ABNORMAL LOW (ref 98–111)
Creatinine, Ser: 1.4 mg/dL — ABNORMAL HIGH (ref 0.44–1.00)
GFR calc Af Amer: 41 mL/min — ABNORMAL LOW (ref >60)
GFR calc non Af Amer: 36 mL/min — ABNORMAL LOW (ref >60)
Glucose, Bld: 187 mg/dL — ABNORMAL HIGH (ref 70–99)
Potassium: 4 mmol/L (ref 3.5–5.1)
Sodium: 135 mmol/L (ref 135–145)
Total Bilirubin: 2.7 mg/dL — ABNORMAL HIGH (ref 0.3–1.2)
Total Protein: 6.1 g/dL — ABNORMAL LOW (ref 6.5–8.1)

## 2018-11-12 LAB — TROPONIN I (HIGH SENSITIVITY)
Troponin I (High Sensitivity): 44 ng/L — ABNORMAL HIGH (ref <18)
Troponin I (High Sensitivity): 48 ng/L — ABNORMAL HIGH (ref <18)
Troponin I (High Sensitivity): 60 ng/L — ABNORMAL HIGH (ref <18)

## 2018-11-12 LAB — ECHOCARDIOGRAM LIMITED
Height: 64 in
Weight: 2400 oz

## 2018-11-12 LAB — BRAIN NATRIURETIC PEPTIDE: B Natriuretic Peptide: 3846.4 pg/mL — ABNORMAL HIGH (ref 0.0–100.0)

## 2018-11-12 LAB — SARS CORONAVIRUS 2 BY RT PCR (HOSPITAL ORDER, PERFORMED IN ~~LOC~~ HOSPITAL LAB): SARS Coronavirus 2: NEGATIVE

## 2018-11-12 LAB — MAGNESIUM: Magnesium: 1.6 mg/dL — ABNORMAL LOW (ref 1.7–2.4)

## 2018-11-12 SURGERY — VENTRICULAR ASSIST DEVICE INSERTION
Anesthesia: LOCAL

## 2018-11-12 MED ORDER — SODIUM CHLORIDE 0.9 % IV SOLN: 250.0000 mL | INTRAVENOUS | Status: DC

## 2018-11-12 MED ORDER — SODIUM BICARBONATE 8.4 % IV SOLN
100.0000 meq | Freq: Once | INTRAVENOUS | Status: AC
  Administered 2018-11-12: 100 meq via INTRAVENOUS

## 2018-11-12 MED ORDER — IOHEXOL 350 MG/ML SOLN
INTRAVENOUS | Status: DC | PRN
  Administered 2018-11-12: 25 mL via INTRA_ARTERIAL

## 2018-11-12 MED ORDER — SODIUM CHLORIDE 0.9 % IV BOLUS
1000.0000 mL | Freq: Once | INTRAVENOUS | Status: AC
  Administered 2018-11-12: 1000 mL via INTRAVENOUS

## 2018-11-12 MED ORDER — SODIUM CHLORIDE 0.9 % IV SOLN
INTRAVENOUS | Status: AC | PRN
  Administered 2018-11-12: 250 mL via INTRAVENOUS

## 2018-11-12 MED ORDER — VASOPRESSIN 20 UNIT/ML IV SOLN
0.0300 [IU]/min | INTRAVENOUS | Status: DC
  Administered 2018-11-12 – 2018-11-13 (×2): 0.03 [IU]/min via INTRAVENOUS
  Filled 2018-11-12 (×2): qty 2

## 2018-11-12 MED ORDER — ONDANSETRON HCL 4 MG/2ML IJ SOLN
4.0000 mg | Freq: Once | INTRAMUSCULAR | Status: AC
  Administered 2018-11-12: 18:00:00 4 mg via INTRAVENOUS

## 2018-11-12 MED ORDER — ATROPINE SULFATE 1 MG/10ML IJ SOSY
PREFILLED_SYRINGE | INTRAMUSCULAR | Status: AC
  Filled 2018-11-12: qty 10

## 2018-11-12 MED ORDER — NOREPINEPHRINE 4 MG/250ML-% IV SOLN
INTRAVENOUS | Status: AC
  Filled 2018-11-12: qty 250

## 2018-11-12 MED ORDER — DILTIAZEM HCL 60 MG PO TABS
120.0000 mg | ORAL_TABLET | Freq: Once | ORAL | Status: DC
  Filled 2018-11-12: qty 2

## 2018-11-12 MED ORDER — MIDAZOLAM BOLUS VIA INFUSION
1.0000 mg | INTRAVENOUS | Status: DC | PRN
  Filled 2018-11-12: qty 2

## 2018-11-12 MED ORDER — PANTOPRAZOLE SODIUM 40 MG IV SOLR
40.0000 mg | Freq: Every day | INTRAVENOUS | Status: DC
  Administered 2018-11-13: 40 mg via INTRAVENOUS
  Filled 2018-11-12: qty 40

## 2018-11-12 MED ORDER — FENTANYL 2500MCG IN NS 250ML (10MCG/ML) PREMIX INFUSION
25.0000 ug/h | INTRAVENOUS | Status: DC
  Administered 2018-11-12: 100 ug/h via INTRAVENOUS
  Filled 2018-11-12: qty 250

## 2018-11-12 MED ORDER — GLUCAGON HCL RDNA (DIAGNOSTIC) 1 MG IJ SOLR
1.0000 mg | Freq: Once | INTRAMUSCULAR | Status: AC
  Administered 2018-11-12: 1 mg via INTRAVENOUS
  Filled 2018-11-12: qty 1

## 2018-11-12 MED ORDER — SODIUM BICARBONATE 8.4 % IV SOLN
INTRAVENOUS | Status: AC
  Filled 2018-11-12: qty 100

## 2018-11-12 MED ORDER — ETOMIDATE 2 MG/ML IV SOLN
INTRAVENOUS | Status: DC | PRN
  Administered 2018-11-12: 20 mg via INTRAVENOUS

## 2018-11-12 MED ORDER — HEPARIN SODIUM (PORCINE) 1000 UNIT/ML IJ SOLN
INTRAMUSCULAR | Status: AC
  Filled 2018-11-12: qty 1

## 2018-11-12 MED ORDER — HEPARIN SODIUM (PORCINE) 1000 UNIT/ML IJ SOLN
INTRAMUSCULAR | Status: DC | PRN
  Administered 2018-11-12: 6000 [IU] via INTRAVENOUS

## 2018-11-12 MED ORDER — NOREPINEPHRINE 4 MG/250ML-% IV SOLN
0.0000 ug/min | INTRAVENOUS | Status: DC
  Administered 2018-11-12: 10 ug/min via INTRAVENOUS
  Administered 2018-11-12: 60 ug/min via INTRAVENOUS
  Filled 2018-11-12: qty 250

## 2018-11-12 MED ORDER — EPINEPHRINE 1 MG/10ML IJ SOSY
PREFILLED_SYRINGE | INTRAMUSCULAR | Status: AC
  Filled 2018-11-12: qty 10

## 2018-11-12 MED ORDER — DILTIAZEM HCL-DEXTROSE 100-5 MG/100ML-% IV SOLN (PREMIX)
5.0000 mg/h | INTRAVENOUS | Status: DC
  Administered 2018-11-12: 5 mg/h via INTRAVENOUS
  Filled 2018-11-12 (×3): qty 100

## 2018-11-12 MED ORDER — MAGNESIUM SULFATE 2 GM/50ML IV SOLN
2.0000 g | Freq: Once | INTRAVENOUS | Status: AC
  Administered 2018-11-12: 2 g via INTRAVENOUS
  Filled 2018-11-12: qty 50

## 2018-11-12 MED ORDER — LIDOCAINE HCL (PF) 1 % IJ SOLN
INTRAMUSCULAR | Status: AC
  Filled 2018-11-12: qty 30

## 2018-11-12 MED ORDER — ROCURONIUM BROMIDE 50 MG/5ML IV SOLN
INTRAVENOUS | Status: DC | PRN
  Administered 2018-11-12: 60 mg via INTRAVENOUS

## 2018-11-12 MED ORDER — FLECAINIDE ACETATE 100 MG PO TABS
100.0000 mg | ORAL_TABLET | Freq: Once | ORAL | Status: AC
  Administered 2018-11-12: 100 mg via ORAL
  Filled 2018-11-12: qty 1

## 2018-11-12 MED ORDER — SODIUM BICARBONATE 8.4 % IV SOLN
INTRAVENOUS | Status: AC
  Filled 2018-11-12: qty 50

## 2018-11-12 MED ORDER — RIVAROXABAN 20 MG PO TABS
20.0000 mg | ORAL_TABLET | Freq: Every day | ORAL | Status: DC
  Administered 2018-11-12: 20 mg via ORAL
  Filled 2018-11-12: qty 1

## 2018-11-12 MED ORDER — ATROPINE SULFATE 1 MG/10ML IJ SOSY
PREFILLED_SYRINGE | INTRAMUSCULAR | Status: DC | PRN
  Administered 2018-11-12: 1 mg via INTRAVENOUS

## 2018-11-12 MED ORDER — MIDAZOLAM 50MG/50ML (1MG/ML) PREMIX INFUSION
0.0000 mg/h | INTRAVENOUS | Status: DC
  Administered 2018-11-12: 2 mg/h via INTRAVENOUS
  Administered 2018-11-13: 1 mg/h via INTRAVENOUS
  Filled 2018-11-12 (×2): qty 50

## 2018-11-12 MED ORDER — HEPARIN (PORCINE) IN NACL 1000-0.9 UT/500ML-% IV SOLN
INTRAVENOUS | Status: AC
  Filled 2018-11-12: qty 1500

## 2018-11-12 MED ORDER — ATENOLOL 50 MG PO TABS
100.0000 mg | ORAL_TABLET | Freq: Once | ORAL | Status: AC
  Administered 2018-11-12: 100 mg via ORAL
  Filled 2018-11-12: qty 4

## 2018-11-12 MED ORDER — SODIUM BICARBONATE 8.4 % IV SOLN
INTRAVENOUS | Status: DC | PRN
  Administered 2018-11-12: 50 meq via INTRAVENOUS

## 2018-11-12 MED ORDER — EPINEPHRINE 1 MG/10ML IJ SOSY: PREFILLED_SYRINGE | INTRAMUSCULAR | Status: DC | PRN

## 2018-11-12 MED ORDER — ETOMIDATE 2 MG/ML IV SOLN
10.0000 mg | Freq: Once | INTRAVENOUS | Status: AC
  Administered 2018-11-12: 10 mg via INTRAVENOUS
  Filled 2018-11-12: qty 10

## 2018-11-12 MED ORDER — DOPAMINE-DEXTROSE 3.2-5 MG/ML-% IV SOLN
0.0000 ug/kg/min | INTRAVENOUS | Status: DC
  Administered 2018-11-12: 20 ug/kg/min via INTRAVENOUS

## 2018-11-12 MED ORDER — FENTANYL BOLUS VIA INFUSION
25.0000 ug | INTRAVENOUS | Status: DC | PRN
  Filled 2018-11-12: qty 25

## 2018-11-12 MED ORDER — FENTANYL CITRATE (PF) 100 MCG/2ML IJ SOLN: 25.0000 ug | Freq: Once | INTRAMUSCULAR | Status: DC

## 2018-11-12 MED ORDER — ONDANSETRON HCL 4 MG/2ML IJ SOLN
INTRAMUSCULAR | Status: AC
  Administered 2018-11-12: 4 mg via INTRAVENOUS
  Filled 2018-11-12: qty 2

## 2018-11-12 MED ORDER — HEPARIN (PORCINE) 25000 UT/250ML-% IV SOLN
200.0000 [IU]/h | INTRAVENOUS | Status: DC
  Filled 2018-11-12: qty 250

## 2018-11-12 MED ORDER — VASOPRESSIN 20 UNIT/ML IV SOLN: 0.0400 [IU]/min | INTRAVENOUS | Status: DC

## 2018-11-12 MED ORDER — SODIUM CHLORIDE 0.9 % IV BOLUS
500.0000 mL | Freq: Once | INTRAVENOUS | Status: AC
  Administered 2018-11-12: 500 mL via INTRAVENOUS

## 2018-11-12 MED ORDER — HEPARIN SODIUM (PORCINE) 5000 UNIT/ML IJ SOLN
50000.0000 [IU] | INTRAVENOUS | Status: DC
  Administered 2018-11-13: 50000 [IU]
  Filled 2018-11-12: qty 10

## 2018-11-12 MED ORDER — LIDOCAINE HCL (PF) 1 % IJ SOLN
INTRAMUSCULAR | Status: DC | PRN
  Administered 2018-11-12: 18 mL via SUBCUTANEOUS

## 2018-11-12 SURGICAL SUPPLY — 22 items
BAG SNAP BAND KOVER 36X36 (MISCELLANEOUS) ×2 IMPLANT
CATH INFINITI 5FR ANG PIGTAIL (CATHETERS) ×2 IMPLANT
CATH INFINITI 5FR JL4 (CATHETERS) ×2 IMPLANT
CATH INFINITI JR4 5F (CATHETERS) ×2 IMPLANT
CATH OPTITORQUE TIG 4.0 5F (CATHETERS) IMPLANT
CATH SWAN GANZ 7F STRAIGHT (CATHETERS) IMPLANT
CATH SWAN GANZ VIP 7.5F (CATHETERS) ×2 IMPLANT
COVER DOME SNAP 22 D (MISCELLANEOUS) ×2 IMPLANT
GLIDESHEATH SLEND A-KIT 6F 22G (SHEATH) IMPLANT
GUIDEWIRE INQWIRE 1.5J.035X260 (WIRE) IMPLANT
HOVERMATT SINGLE USE (MISCELLANEOUS) ×2 IMPLANT
INQWIRE 1.5J .035X260CM (WIRE)
KIT ENCORE 26 ADVANTAGE (KITS) IMPLANT
KIT HEART LEFT (KITS) ×2 IMPLANT
PACK CARDIAC CATHETERIZATION (CUSTOM PROCEDURE TRAY) ×2 IMPLANT
SET IMPELLA CP PUMP (CATHETERS) ×2 IMPLANT
SHEATH PINNACLE 5F 10CM (SHEATH) ×2 IMPLANT
SHEATH PINNACLE 8F 10CM (SHEATH) ×2 IMPLANT
SLEEVE REPOSITIONING LENGTH 30 (MISCELLANEOUS) ×2 IMPLANT
TRANSDUCER W/STOPCOCK (MISCELLANEOUS) ×2 IMPLANT
TUBING CIL FLEX 10 FLL-RA (TUBING) ×2 IMPLANT
WIRE EMERALD 3MM-J .035X150CM (WIRE) ×2 IMPLANT

## 2018-11-12 NOTE — Progress Notes (Signed)
Bed side echo: Diffuse hypo, anterior akinesis to dyskinesis. Limited window. No effusion.  In view of persistent hypotension in spite of fluid resuscitation, high dose pressors, proceed with cath and impella circulatory assist device placement.   JG

## 2018-11-12 NOTE — ED Triage Notes (Signed)
Pt here from, home with c/o Afib RVR  Rate of 200 pt received 6, 12 and 12 of adenosine without effect pt then received  25mg  Cardizem which brought HR down to 110

## 2018-11-12 NOTE — Progress Notes (Signed)
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  eICU Interventions   Patient now has Impella in place and has been intubated. EF now at 20-25%. On heparin drip as per Impella device protocol as well as NSTEMI. Unable to do head CT at this time due to instability.     Intervention Category Major Interventions: Arrhythmia - evaluation and management;Respiratory failure - evaluation and management Evaluation Type: New Patient Evaluation  Judd Lien 11/11/2018, 11:46 PM

## 2018-11-12 NOTE — Progress Notes (Signed)
  Echocardiogram 2D Echocardiogram has been performed.  Johny Chess 12/01/2018, 11:33 PM

## 2018-11-12 NOTE — Progress Notes (Signed)
PT transported to cath lab on bipap no complications

## 2018-11-12 NOTE — Progress Notes (Signed)
Patient continues to be hypotensive in spite of 20 mcg Levophed. Stat echo ordered.  JG

## 2018-11-12 NOTE — H&P (Addendum)
NAME:  Karina Tran, MRN:  [id number], DOB:  1940/10/25, LOS: 0 ADMISSION DATE:  11/27/2018, CONSULTATION DATE:  12/03/2018 REFERRING MD:  Dr. Roslynn Amble, CHIEF COMPLAINT:  Shock   Brief History   78 year old female admitted with shock after failed chemical/electrical cardioversion for atrial fib.   History of present illness   78 year old female with PMH as below, which is significant for atrial fibrillation s/p ablation and is managed with flecainide, Pulmonary fibrosis (not on home O2), and CHF with known EF 45% and grade 2 DD. Also hx of remote breast Ca s/p mastectomy and radiation. She was in her usual state of health until 2 am on 8/4 when she noticed SOB, chest pressure, nausea, and palpitations. These conditions worsened prompting her to present to the ED later that day. Upon arrival to ED she was found to be in rapid AF with rates in the 190s. This was refractory to adenosine x 3 and cardioversion x 2. She was started on diltiazem and treated with atenolol and flecainide. Her BP was OK at that point, but soon after it declined requiring vasopressors. Despite 15 mcg levophed she remained profoundly hypotensive. PCCM asked to admit.   Upon my arrival she complains of pain all over, can't get comfortable, SOB. Denies fever/chills, cough.    Past Medical History   has a past medical history of Allergic rhinitis, Breast cancer ([place]), Dyspnea, Dysrhythmia, GERD (gastroesophageal reflux disease), H/O asbestos exposure, History of GI bleed, History of kidney infection, History of pulmonary hypertension, HLD (hyperlipidemia), Hypertension, ILD (interstitial lung disease) ([place]), Iron deficiency anemia, Persistent atrial fibrillation, Raynaud phenomenon, and Spinal stenosis.   Significant Hospital Events   8/4 present AF, cardiovert, chemical cardiovert, decompensated requiring pressors. Impella.   Consults:  Cardiology  Procedures:  CVL 8/4 > Art line 8/4 >   Significant Diagnostic Tests:   CT head 8/4 >  Micro Data:  COVID 8/4 neg  Antimicrobials:    Interim history/subjective:    Objective   Blood pressure (!) 65/58, pulse 72, temperature 98 F (36.7 C), temperature source Oral, resp. rate (!) 32, height 5\' 4"  (1.626 m), weight 68 kg, SpO2 100 %.        Intake/Output Summary (Last 24 hours) at 11/09/2018 1953 Last data filed at 11/26/2018 1846 Gross per 24 hour  Intake 576 ml  Output -  Net 576 ml   Filed Weights   11/30/2018 1813  Weight: 68 kg    Examination: General: elderly female in moderate respiratory distress HENT: [place]/AT, PERRL, no appreciable JVD Lungs: Clear. Diminished bases.  Cardiovascular: RRR, no MRG Abdomen: Soft, non-tender, non-distended Extremities: No acute deformity or ROM limitation. Cool distal extremities.  Neuro: Awake, alert, oriented.  Resolved Hospital Problem list     Assessment & Plan:   Cardiogenic shock: etiology unclear. BP was OK upon arrival, but after treatment for rapid AF she deteriorated. Did not respond to glucagon. Hypotensive despite 51mcg norepinephrine. Bedside echo by Dr Einar Gip demonstrates severely reduced LVEF.  Of note, something similar to this happened [facility] in 2019 Acute on chronic CHF ? NSTEMI - Admit to ICU - To cath lab for Impella vs IABP - Continue norepinephrine to keep MAP > 69mmHg - KVO IVF - Repeat labs including lactic, troponin, cortisol, TSH - holding home antihypertensives, diuretics.   Atrial fibrillation: RVR on presentation but improved after cardioversion x 2 then diltiazem, atenolol, and flecainide. QTC prolonged to 630 so no amio given. - Management  per cardiology - Xarelto dose given 8/4 - Holding home atenolol, flecainide.   Acute encephalopathy: in the setting of shock. Did have one seizure like episode in ED when BP was at its lowest. No post ictal period or recurrence.  - Will CT head to ensure no intracranial issue considering seizure, xarelto.  - Supportive care.  Improved with BP support.   AKI  In the setting of shock. At risk of worsening with planned trip to cath lab.  Hypomagnesemia Metabolic acidosis - support BP - Supp mag - Follow BMP  - 2 amps bicarb given in ED. Check ABG.   Acute hypoxemic respiratory failure - PRN BiPAP for WOB, more likely due to acidosis rather than pulmonary disease - Unable to diurese - Low threshold to intubate.   Best practice:  Diet: NPO Pain/Anxiety/Delirium protocol (if indicated): NA VAP protocol (if indicated): NA DVT prophylaxis: xarelto given 8/4 GI prophylaxis: PPI Glucose control: NA Mobility: BR Code Status: FULL Family Communication: Daughter updated over phone. Speech slurring may have been intoxicated. Disposition: ICU    Labs   CBC: Recent Labs  Lab 12/08/2018 1126  WBC 9.6  NEUTROABS 7.8*  HGB 13.7  HCT 42.9  MCV 96.2  PLT 235    Basic Metabolic Panel: Recent Labs  Lab 11/15/2018 1133 11/21/2018 1536  NA 135  --   K 4.0  --   CL 97*  --   CO2 18*  --   GLUCOSE 187*  --   BUN 8  --   CREATININE 1.40*  --   CALCIUM 9.0  --   MG  --  1.6*   GFR: Estimated Creatinine Clearance: 30.9 mL/min (A) (by C-G formula based on SCr of 1.4 mg/dL (H)). Recent Labs  Lab 12/04/2018 1126  WBC 9.6    Liver Function Tests: Recent Labs  Lab 12/09/2018 1133  AST 50*  ALT 37  ALKPHOS 72  BILITOT 2.7*  PROT 6.1*  ALBUMIN 3.3*   No results for input(s): LIPASE, AMYLASE in the last 168 hours. No results for input(s): AMMONIA in the last 168 hours.  ABG    Component Value Date/Time   PHART 7.341 (L) 12/23/2017 2240   PCO2ART 22.6 (L) 12/23/2017 2240   PO2ART 93.6 12/23/2017 2240   HCO3 15.2 (L) 12/23/2017 2240   ACIDBASEDEF 12.8 (H) 12/23/2017 2240   O2SAT 95.5 12/23/2017 2240     Coagulation Profile: No results for input(s): INR, PROTIME in the last 168 hours.  Cardiac Enzymes: No results for input(s): CKTOTAL, CKMB, CKMBINDEX, TROPONINI in the last 168 hours.  HbA1C:  Hgb A1c MFr Bld  Date/Time Value Ref Range Status  12/23/2017 09:01 AM 5.4 4.8 - 5.6 % Final    Comment:    (NOTE)         Prediabetes: 5.7 - 6.4         Diabetes: >6.4         Glycemic control for adults with diabetes: <7.0     CBG: No results for input(s): GLUCAP in the last 168 hours.  Review of Systems:   negative except for as outlined in HPI  Past Medical History  She,  has a past medical history of Allergic rhinitis, Breast cancer ([place]), Dyspnea, Dysrhythmia, GERD (gastroesophageal reflux disease), H/O asbestos exposure, History of GI bleed, History of kidney infection, History of pulmonary hypertension, HLD (hyperlipidemia), Hypertension, ILD (interstitial lung disease) ([place]), Iron deficiency anemia, Persistent atrial fibrillation, Raynaud phenomenon, and Spinal stenosis.   Surgical History  Past Surgical History:  Procedure Laterality Date  . ABLATION  2009   PVI in [place]  . afib ablation  10/06/2010  . APPENDECTOMY    . BREAST LUMPECTOMY Left 2007   malignant  . BREAST SURGERY Left 2007   partial mastectomy and lumpectomy   . CARDIOVERSION N/A 11/18/2014   Procedure: CARDIOVERSION;  Surgeon: Adrian Prows, MD;  Location: [place];  Service: Cardiovascular;  Laterality: N/A;  . carpel tunnel    . CESAREAN SECTION    . GANGLION CYST EXCISION     x2  . HEMORRHOID SURGERY    . KYPHOPLASTY  2017  . TONSILLECTOMY    . TOTAL HIP ARTHROPLASTY Left 07/03/2017   Procedure: LEFT TOTAL HIP ARTHROPLASTY ANTERIOR APPROACH;  Surgeon: Paralee Cancel, MD;  Location: [facility];  Service: Orthopedics;  Laterality: Left;  70 mins  . TOTAL HIP ARTHROPLASTY Right 05/09/2018   Procedure: TOTAL HIP ARTHROPLASTY ANTERIOR APPROACH;  Surgeon: Paralee Cancel, MD;  Location: [facility];  Service: Orthopedics;  Laterality: Right;  70 minutes     Social History   reports that she is a non-smoker but has been exposed to tobacco smoke. She has never used smokeless tobacco. She reports current alcohol  use. She reports that she does not use drugs.   Family History   Her family history includes Brain cancer in her father; Heart failure in her mother; Lung cancer in her sister. There is no history of Lung disease, Rheumatologic disease, Colon cancer, or Colon polyps.   Allergies Allergies  Allergen Reactions  . Aspirin Other (See Comments)    Bloody noses  . Blue Dyes (Parenteral) Nausea And Vomiting  . Demerol [Meperidine] Nausea And Vomiting     Home Medications  Prior to Admission medications   Medication Sig Start Date End Date Taking? Authorizing Provider  amLODipine (NORVASC) 5 MG tablet Take 5 mg by mouth daily.   Yes [provider]  atenolol (TENORMIN) 50 MG tablet Take 50 mg by mouth daily.   Yes [provider]  Calcium Carb-Cholecalciferol (CALCIUM 600+D3 PO) Take 1 tablet by mouth every morning.    Yes [provider]  Coenzyme Q10 (CO Q10) 200 MG CAPS Take 200 mg by mouth every morning.    Yes [provider]  flecainide (TAMBOCOR) 50 MG tablet Take 1 tablet (50 mg total) by mouth every 12 (twelve) hours. 06/18/18  Yes Adrian Prows, MD  hydrochlorothiazide (HYDRODIURIL) 25 MG tablet Take 25 mg by mouth daily.   Yes [provider]  Multiple Vitamins-Minerals (MULTIVITAMIN ADULT) CHEW Chew 2 each by mouth daily.   Yes [provider]  pantoprazole (PROTONIX) 40 MG tablet Take 40 mg by mouth every morning.    Yes [provider]  POTASSIUM GLUCONATE PO Take 75 mg by mouth daily.    Yes [provider]  rivaroxaban (XARELTO) 20 MG TABS tablet Take 1 tablet (20 mg total) by mouth daily with supper. Patient taking differently: Take 20 mg by mouth every morning.  11/18/14  Yes Neldon Labella, NP  docusate sodium (COLACE) 100 MG capsule Take 1 capsule (100 mg total) by mouth 2 (two) times daily. Patient not taking: Reported on 11/19/2018 05/10/18   Danae Orleans, PA-C  ferrous sulfate (FERROUSUL) 325 (65 FE)  MG tablet Take 1 tablet (325 mg total) by mouth 3 (three) times daily with meals. Patient not taking: Reported on 11/18/2018 05/10/18   Danae Orleans, PA-C     Critical care time: 60 mins  Georgann Housekeeper, AGACNP-BC [place] Pager [phone] or [phone]  12/08/2018 9:32 PM   Patient seen and examined, agree with above note.  I dictated the care and orders written for this patient under my direction. Cardiogenic shock secondary to possible Takotsubo, clean coronaries in cath , on high dose pressors and impella Repeat labs and ABG  S/p intubation in the cathlab, she was still awake and cooperative but very tachypNeic on the BIPAP.  D/w cardiology Prognosis is guarded  Cc time: 60 mins  Roxanne Mins, [place]

## 2018-11-12 NOTE — Procedures (Signed)
Arterial Catheter Insertion Procedure Note Majesti Gambrell [id number] 16-Aug-1940  Procedure: Insertion of Arterial Catheter  Indications: Blood pressure monitoring  Procedure Details Consent: Risks of procedure as well as the alternatives and risks of each were explained to the (patient/caregiver).  Consent for procedure obtained. Time Out: Verified patient identification, verified procedure, site/side was marked, verified correct patient position, special equipment/implants available, medications/allergies/relevent history reviewed, required imaging and test results available.  Performed  Maximum sterile technique was used including antiseptics, cap, gloves, gown, hand hygiene, mask and sheet. Skin prep: Chlorhexidine; local anesthetic administered 20 gauge catheter was inserted into right radial artery using the Seldinger technique. ULTRASOUND GUIDANCE USED: YES Evaluation Blood flow good; BP tracing good. Complications: No apparent complications.   Georgann Housekeeper, AGACNP-BC [place] Pager [phone] or [phone]  11/14/2018 9:34 PM

## 2018-11-12 NOTE — ED Provider Notes (Signed)
[facility] EMERGENCY DEPARTMENT Provider Note   CSN: [id number] Arrival date & time: 12/06/2018  1110    History   Chief Complaint Chief Complaint  Patient presents with   Atrial Fibrillation    HPI Karina Tran is a 78 y.o. female.  A. fib, hypertension, hyperlipidemia, pulmonary fibrosis presents numbers department with chief complaint palpitations, rapid heart rate.  Patient states last night she was in her normal state of health, was up reading last night when she had sudden onset of feeling nauseous, then having severe palpitations.  States palpitations and rapid heart rate have persisted throughout the morning.  States similar to prior episodes of A. fib/a flutter.     Patient currently denies any complaints specifically no chest pain, shortness of breath.  Does endorse some dyspnea on exertion.  No new cough, fever, sick contacts.  For afib patient on atenolol, flecainide (not taking recently due to running out of refills), s/p ablation, on Xarelto.     HPI  Past Medical History:  Diagnosis Date   Allergic rhinitis    Breast cancer ([place])    left   Dyspnea    chronic with exertion    Dysrhythmia    a fib  had ablation in [place]    GERD (gastroesophageal reflux disease)    H/O asbestos exposure    History of GI bleed    last episode 02-02-16; was due to hemorroids    History of kidney infection    History of pulmonary hypertension    mild ; (see ECHO result in [facility] with Dr. Kela Millin 06-18-17 on chart )  pt. denies at preop   HLD (hyperlipidemia)    denies    Hypertension    ILD (interstitial lung disease) ([place])    Iron deficiency anemia    gets iv infusions   Persistent atrial fibrillation    a. 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   Ischemic hepatitis 02/27/2018    Coagulopathy ([place]) 12/25/2017   Elevated LFTs    Sepsis due to undetermined organism ([place]) 12/24/2017   Calculus of gallbladder without cholecystitis without obstruction    Transaminasemia 12/23/2017   Sepsis secondary to UTI ([place]) 12/22/2017   Hypotension, unspecified 12/22/2017   Acute lower UTI 12/22/2017   Nausea & vomiting 12/22/2017   AKI (acute kidney injury) ([place]) 12/22/2017   Hypotension 12/22/2017   Overweight (BMI 25.0-29.9) 07/04/2017   S/P left THA, AA 07/03/2017   S/P hip replacement 07/03/2017   ILD (interstitial lung disease) ([place]) 05/17/2016   Asbestos exposure 05/17/2016   Raynaud phenomenon 05/17/2016   Shortness of breath 03/22/2016   GERD (gastroesophageal reflux disease) 03/22/2016   Arthritis 03/22/2016   Chronic seasonal allergic rhinitis 03/22/2016   Pancreatitis 03/09/2015   Essential hypertension    Loss of weight    Thoracic compression fracture (HCC)    Paroxysmal atrial fibrillation ([place])    Atrial flutter ([place]) 11/17/2014   Breast cancer of upper-outer quadrant of left female breast ([place]) 06/25/2014    Past Surgical History:  Procedure Laterality Date   ABLATION  2009   PVI in [place]   afib ablation  10/06/2010   APPENDECTOMY     BREAST LUMPECTOMY Left 2007   malignant   BREAST SURGERY Left 2007  partial mastectomy and lumpectomy    CARDIOVERSION N/A 11/18/2014   Procedure: CARDIOVERSION;  Surgeon: Adrian Prows, MD;  Location: [place];  Service: Cardiovascular;  Laterality: N/A;   carpel tunnel     CESAREAN SECTION     GANGLION CYST EXCISION     x2   HEMORRHOID SURGERY     KYPHOPLASTY  2017   TONSILLECTOMY     TOTAL HIP ARTHROPLASTY Left 07/03/2017   Procedure: LEFT TOTAL HIP ARTHROPLASTY ANTERIOR APPROACH;  Surgeon: Paralee Cancel, MD;  Location: [facility];  Service: Orthopedics;  Laterality: Left;  70 mins   TOTAL HIP ARTHROPLASTY Right 05/09/2018   Procedure: TOTAL HIP ARTHROPLASTY ANTERIOR  APPROACH;  Surgeon: Paralee Cancel, MD;  Location: [facility];  Service: Orthopedics;  Laterality: Right;  70 minutes     OB History   No obstetric history on file.      Home Medications    Prior to Admission medications   Medication Sig Start Date End Date Taking? Authorizing Provider  amLODipine (NORVASC) 5 MG tablet Take 5 mg by mouth daily.   Yes [provider]  atenolol (TENORMIN) 50 MG tablet Take 50 mg by mouth daily.   Yes [provider]  Calcium Carb-Cholecalciferol (CALCIUM 600+D3 PO) Take 1 tablet by mouth every morning.    Yes [provider]  Coenzyme Q10 (CO Q10) 200 MG CAPS Take 200 mg by mouth every morning.    Yes [provider]  flecainide (TAMBOCOR) 50 MG tablet Take 1 tablet (50 mg total) by mouth every 12 (twelve) hours. 06/18/18  Yes Adrian Prows, MD  hydrochlorothiazide (HYDRODIURIL) 25 MG tablet Take 25 mg by mouth daily.   Yes [provider]  Multiple Vitamins-Minerals (MULTIVITAMIN ADULT) CHEW Chew 2 each by mouth daily.   Yes [provider]  pantoprazole (PROTONIX) 40 MG tablet Take 40 mg by mouth every morning.    Yes [provider]  POTASSIUM GLUCONATE PO Take 75 mg by mouth daily.    Yes [provider]  rivaroxaban (XARELTO) 20 MG TABS tablet Take 1 tablet (20 mg total) by mouth daily with supper. Patient taking differently: Take 20 mg by mouth every morning.  11/18/14  Yes Neldon Labella, NP  docusate sodium (COLACE) 100 MG capsule Take 1 capsule (100 mg total) by mouth 2 (two) times daily. Patient not taking: Reported on 12/05/2018 05/10/18   Danae Orleans, PA-C  ferrous sulfate (FERROUSUL) 325 (65 FE) MG tablet Take 1 tablet (325 mg total) by mouth 3 (three) times daily with meals. Patient not taking: Reported on 11/24/2018 05/10/18   Danae Orleans, PA-C    Family History Family History  Problem Relation Age of Onset   Lung cancer Sister    Heart failure Mother    Brain  cancer Father    Lung disease Neg Hx    Rheumatologic disease Neg Hx    Colon cancer Neg Hx    Colon polyps Neg Hx     Social History Social History   Tobacco Use   Smoking status: Passive Smoke Exposure - Never Smoker   Smokeless tobacco: Never Used   Tobacco comment: exposed to second-hand smoke her entire life  Substance Use Topics   Alcohol use: Yes    Comment: social ETOH, beer    Drug use: No     Allergies   Aspirin, Blue dyes (parenteral), and Demerol [meperidine]   Review of Systems Review of Systems   Physical Exam Updated Vital Signs BP Marland Kitchen)  70/49    Pulse 73    Temp 98 F (36.7 C) (Oral)    Resp (!) 33    Ht 5\' 4"  (1.626 m)    Wt 68 kg    SpO2 100%    BMI 25.75 kg/m   Physical Exam Vitals signs and nursing note reviewed.  Constitutional:      General: She is not in acute distress.    Appearance: She is well-developed.  HENT:     Head: Normocephalic and atraumatic.  Eyes:     Conjunctiva/sclera: Conjunctivae normal.  Neck:     Musculoskeletal: Neck supple.  Cardiovascular:     Heart sounds: No murmur.     Comments: Tachycardic, irregular rhythm Pulmonary:     Effort: Pulmonary effort is normal. No respiratory distress.     Breath sounds: Normal breath sounds.  Abdominal:     Palpations: Abdomen is soft.     Tenderness: There is no abdominal tenderness.  Skin:    General: Skin is warm and dry.  Neurological:     Mental Status: She is alert.      ED Treatments / Results  Labs (all labs ordered are listed, but only abnormal results are displayed) Labs Reviewed  CBC WITH DIFFERENTIAL/PLATELET - Abnormal; Notable for the following components:      Result Value   RDW 16.0 (*)    Neutro Abs 7.8 (*)    Lymphs Abs 0.6 (*)    Monocytes Absolute 1.1 (*)    All other components within normal limits  BRAIN NATRIURETIC PEPTIDE - Abnormal; Notable for the following components:   B Natriuretic Peptide 3,846.4 (*)    All other components  within normal limits  COMPREHENSIVE METABOLIC PANEL - Abnormal; Notable for the following components:   Chloride 97 (*)    CO2 18 (*)    Glucose, Bld 187 (*)    Creatinine, Ser 1.40 (*)    Total Protein 6.1 (*)    Albumin 3.3 (*)    AST 50 (*)    Total Bilirubin 2.7 (*)    GFR calc non Af Amer 36 (*)    GFR calc Af Amer 41 (*)    Anion gap 20 (*)    All other components within normal limits  MAGNESIUM - Abnormal; Notable for the following components:   Magnesium 1.6 (*)    All other components within normal limits  TROPONIN I (HIGH SENSITIVITY) - Abnormal; Notable for the following components:   Troponin I (High Sensitivity) 44 (*)    All other components within normal limits  TROPONIN I (HIGH SENSITIVITY) - Abnormal; Notable for the following components:   Troponin I (High Sensitivity) 48 (*)    All other components within normal limits  TROPONIN I (HIGH SENSITIVITY) - Abnormal; Notable for the following components:   Troponin I (High Sensitivity) 60 (*)    All other components within normal limits  SARS CORONAVIRUS 2 (HOSPITAL ORDER, [place] [facility])  BLOOD GAS, ARTERIAL  CBC  BASIC METABOLIC PANEL  MAGNESIUM  PHOSPHORUS  FLECAINIDE LEVEL  BRAIN NATRIURETIC PEPTIDE  BRAIN NATRIURETIC PEPTIDE  TROPONIN I (HIGH SENSITIVITY)    EKG EKG Interpretation  Date/Time:  Tuesday November 12 2018 18:35:38 EDT Ventricular Rate:  82 PR Interval:    QRS Duration: 109 QT Interval:  537 QTC Calculation: 628 R Axis:   130 Text Interpretation:  Accelerated junctional rhythm vs sinus rhythm abnormal t wave in anterolateral leads no st elevation Confirmed by  Madalyn Rob ([id number]) on 12/04/2018 8:11:07 PM   Radiology Dg Chest Portable 1 View  Result Date: 12/08/2018 CLINICAL DATA:  Shortness of breath EXAM: PORTABLE CHEST 1 VIEW COMPARISON:  November 12, 2018 FINDINGS: The heart size remains enlarged. There are worsening bilateral pleural effusions and worsening  vascular congestion. There is no pneumothorax. There is a persistent opacity at the left lung base. There is no acute osseous abnormality. IMPRESSION: 1. Cardiomegaly with worsening bilateral pleural effusions and vascular congestion. 2. Persistent bibasilar airspace opacities, greatest at the left lung base. Differential considerations include atelectasis or infiltrate. Electronically Signed   By: Constance Holster M.D.   On: 12/02/2018 19:43   Dg Chest Portable 1 View  Result Date: 11/27/2018 CLINICAL DATA:  Shortness of breath. EXAM: PORTABLE CHEST 1 VIEW COMPARISON:  Chest radiograph 12/30/2017, FINDINGS: Cardiomegaly. Bibasilar opacities and blunting of the bilateral lateral costophrenic angles, more conspicuous on the left as compared to prior examination 12/30/2017. Findings likely reflect a combination of small bilateral pleural effusions with bibasilar atelectasis. A left lower lobe pneumonia is difficult to exclude. Chronic prominence of the interstitial lung markings. No acute bony abnormality. Thoracic dextrocurvature. IMPRESSION: Cardiomegaly. Bilateral pleural effusions with bibasilar atelectasis again demonstrated. Opacity at the left lung base is increased from prior examination 12/30/2017, and a left lower lobe pneumonia is difficult to exclude. Electronically Signed   By: Kellie Simmering   On: 11/20/2018 11:50    Procedures .Critical Care Performed by: Lucrezia Starch, MD Authorized by: Lucrezia Starch, MD   Critical care provider statement:    Critical care time (minutes):  75   Critical care was time spent personally by me on the following activities:  Discussions with consultants, evaluation of patient's response to treatment, examination of patient, ordering and performing treatments and interventions, ordering and review of laboratory studies, ordering and review of radiographic studies, pulse oximetry, re-evaluation of patient's condition, obtaining history from patient or  surrogate and review of old charts .Cardioversion  Date/Time: 11/30/2018 7:59 PM Performed by: Lucrezia Starch, MD Authorized by: Lucrezia Starch, MD   Consent:    Consent obtained:  Written   Consent given by:  Patient   Alternatives discussed:  Rate-control medication, alternative treatment, observation and delayed treatment Pre-procedure details:    Rhythm:  Atrial fibrillation Patient sedated: Yes. Refer to sedation procedure documentation for details of sedation.  Attempt one:    Cardioversion mode:  Synchronous   Shock (Joules):  100   Shock outcome:  No change in rhythm Attempt two:    Cardioversion mode:  Synchronous   Shock (Joules):  150   Shock outcome:  No change in rhythm Post-procedure details:    Patient status:  Awake   Patient tolerance of procedure:  Tolerated well, no immediate complications Comments:     2 unsuccessful attempts at cardioversion   .Sedation  Date/Time: 12/09/2018 8:02 PM Performed by: Lucrezia Starch, MD Authorized by: Lucrezia Starch, MD   Consent:    Consent obtained:  Written   Consent given by:  Patient   Risks discussed:  Allergic reaction, dysrhythmia, inadequate sedation, nausea and vomiting Universal protocol:    Immediately prior to procedure a time out was called: yes   Indications:    Procedure performed:  Cardioversion Pre-sedation assessment:    Time since last food or drink:  Last night   ASA classification: class 2 - patient with mild systemic disease     Neck mobility: normal  Mallampati score:  II - soft palate, uvula, fauces visible   Pre-sedation assessments completed and reviewed: airway patency, cardiovascular function, hydration status, mental status, nausea/vomiting, pain level and respiratory function   Procedure details (see MAR for exact dosages):    Preoxygenation:  Nasal cannula   Sedation:  Etomidate   Intra-procedure monitoring:  Blood pressure monitoring, cardiac monitor, continuous  pulse oximetry, continuous capnometry, frequent LOC assessments and frequent vital sign checks   Intra-procedure events: none     Total Provider sedation time (minutes):  10 Post-procedure details:    Attendance: Constant attendance by certified staff until patient recovered     Recovery: Patient returned to pre-procedure baseline     Patient tolerance:  Tolerated well, no immediate complications Comments:     unsuccessful cardioversion attempt, no complications with sedation   (including critical care time)  Medications Ordered in ED Medications  rivaroxaban (XARELTO) tablet 20 mg (20 mg Oral Given 12/08/2018 1711)  DOPamine (INTROPIN) 800 mg in dextrose 5 % 250 mL (3.2 mg/mL) infusion (0 mcg/kg/min  68 kg Intravenous Stopped 11/22/2018 1846)  0.9 %  sodium chloride infusion (has no administration in time range)  norepinephrine (LEVOPHED) 4mg  in 281mL premix infusion (20 mcg/min Intravenous Rate/Dose Change 12/01/2018 1943)  pantoprazole (PROTONIX) injection 40 mg (has no administration in time range)  sodium bicarbonate injection 100 mEq (has no administration in time range)  flecainide (TAMBOCOR) tablet 100 mg (100 mg Oral Given 11/23/2018 1442)  etomidate (AMIDATE) injection 10 mg (10 mg Intravenous Given 12/02/2018 1554)  magnesium sulfate IVPB 2 g 50 mL (0 g Intravenous Stopped 11/15/2018 1804)  atenolol (TENORMIN) tablet 100 mg (100 mg Oral Given 12/02/2018 1708)  sodium chloride 0.9 % bolus 500 mL (0 mLs Intravenous Stopped 11/21/2018 1824)  ondansetron (ZOFRAN) injection 4 mg (4 mg Intravenous Given 12/01/2018 1813)  sodium chloride 0.9 % bolus 1,000 mL (1,000 mLs Intravenous New Bag/Given 11/11/2018 1828)  glucagon (human recombinant) (GLUCAGEN) injection 1 mg (1 mg Intravenous Given 11/26/2018 2005)     Initial Impression / Assessment and Plan / ED Course  I have reviewed the triage vital signs and the nursing notes.  Pertinent labs & imaging results that were available during my care of the patient were reviewed  by me and considered in my medical decision making (see chart for details).  Clinical Course as of Nov 11 2009  Tue Nov 12, 2018  1130 Completed initial assessment, stable BP, good mental status, HR 130-150s while in room, s/p adenosine and cardizem bolus by EMS; will initiate cardizem gtt   [RD]  1206 Rechecked patient   [RD]  1310 Discussed case with Dr. Irven Shelling PA who will review case with him and call back with further recommendations   [RD]  1330 Rechecked patient   [RD]  1356 Discussed with cardiologist - Einar Gip who will come evaluate patient, may consider cardioversion but wants to try dose of flecainide first   [RD]  1754 Notified by RN, patient had episode of hypotension, ordered 515mL bolus NS   [RD]  1824 Patient hypotensive despite fluids, will start pressor support   [RD]  1854 Discussed case with CCM via phone, Ganji present for conversation   [RD]  1941 CCM at bedside   [RD]    Clinical Course User Index [RD] Lucrezia Starch, MD       78 year old lady with a past medical history of atrial fibrillation s/p ablation managed on flecainide, atenolol.  Patient presented to emergency department with rapid  heart rate, palpitations.  By EMS noted to have heart rate around 200, received adenosine and diltiazem prior to arrival.  Based on review of strips, suspect atrial fibrillation with rapid ventricular rate.  Here patient still tachycardic to 150s.  Started on diltiazem drip.  Discussed case with her cardiologist, who recommended trial of oral flecainide and if unsuccessful conversion then electrical cardioversion.  Performed cardioversion x2, unsuccessful in converting to sinus rhythm.  Discussed case again with cardiologist, recommended giving oral dose atenolol, Cardizem and would evaluate patient in person.  Soon after received dose of atenolol, patient had precipitous drop in her blood pressure.  Did not improve with fluid bolus, started pressor support.  Repeat EKG with  ischemic changes.  Patient also had what appeared to be a seizure-like episode, resolved spontaneously, no additional episodes occurred.  Patient had full return to normal baseline promptly.  After starting pressors, patient's mental status significantly improved, EKG changes resolved, appeared to be back in sinus rhythm.  Cardiology was at bedside.  Updated neuro critical care medicine who agreed to admit patient to an ICU bed.  They evaluated patient at bedside in the ED, they will place central access for patient.  While medication side effect may be possible, exact etiology for her precipitous drop in blood pressure unclear at this time.  Primary cardiac process remains possible.  Patient reported compliance with AC, doubt pulmonary embolus.  No fever or cough, but will check COVID.  Cardiology will continue to follow closely, plan to obtain formal echocardiogram.     Final Clinical Impressions(s) / ED Diagnoses   Final diagnoses:  Atrial fibrillation with RVR (HCC)  Hypomagnesemia  Shock ([place])  AKI (acute kidney injury) ([place])  Hypotension, unspecified hypotension type  Elevated brain natriuretic peptide (BNP) level    ED Discharge Orders    None       Lucrezia Starch, MD 11/10/2018 2011

## 2018-11-12 NOTE — Procedures (Signed)
Intubation Procedure Note Mar Zettler [id number] 1940-11-19  Procedure: Intubation Indications: Airway protection and maintenance  Procedure Details Consent: Unable to obtain consent because of emergent medical necessity. Time Out: Verified patient identification, verified procedure, site/side was marked, verified correct patient position, special equipment/implants available, medications/allergies/relevent history reviewed, required imaging and test results available.  Performed  Drugs:  20 mg Etomidate, 60 mg Rocuronium. Glidoscope x 1 with # [id number]. Grade 2a view. 7.5 tube visualized passing through vocal cords. Following intubation:  positive color change on ETCO2, condensation seen in endotracheal tube, equal breath sounds bilaterally.  Evaluation Hemodynamic Status: Transient hypotension treated with pressors; O2 sats: stable throughout Patient's Current Condition: unstable Complications: No apparent complications Patient did tolerate procedure well. Chest X-ray ordered to verify placement.  CXR: pending.   Aurea Graff MD 11/17/2018, 11:36 PM

## 2018-11-12 NOTE — Progress Notes (Signed)
This chaplain responded to Code Stemi in [place].  The chaplain talked to RN-Kelly.  The chaplain understands the Pt. is headed to Cath Lab and then 2H.  At this time the chaplain understands the Pt. family has not been contacted. The Chaplain is available for F/U spiritual care as needed.

## 2018-11-12 NOTE — ED Notes (Signed)
Pt in A-fib w/RVR ?

## 2018-11-12 NOTE — ED Notes (Signed)
Pt is NSR on monitor 

## 2018-11-12 NOTE — ED Triage Notes (Signed)
TC  To family  To report Pt will be admitted I spoke with the patient about this.Pt will be admitted.

## 2018-11-12 NOTE — Procedures (Signed)
Central Venous Catheter Insertion Procedure Note Tyreonna Czaplicki [id number] 01/06/1941  Procedure: Insertion of Central Venous Catheter Indications: Drug and/or fluid administration  Procedure Details Consent: Risks of procedure as well as the alternatives and risks of each were explained to the (patient/caregiver).  Consent for procedure obtained. Time Out: Verified patient identification, verified procedure, site/side was marked, verified correct patient position, special equipment/implants available, medications/allergies/relevent history reviewed, required imaging and test results available.  Performed  Maximum sterile technique was used including antiseptics, cap, gloves, gown, hand hygiene, mask and sheet. Skin prep: Chlorhexidine; local anesthetic administered A antimicrobial bonded/coated triple lumen catheter was placed in the right femoral vein due to emergent situation using the Seldinger technique. Catheter placed to 20 cm. Blood aspirated via all 3 ports and then flushed x 3. Line sutured x 2 and dressing applied.  Ultrasound guidance used.Yes.    Evaluation Blood flow good Complications: No apparent complications Patient did tolerate procedure well. Chest X-ray ordered to verify placement.  CXR: NA.   Georgann Housekeeper, AGACNP-BC [place] Pager [phone] or [phone]  11/21/2018 9:32 PM

## 2018-11-12 NOTE — ED Notes (Signed)
1551 Time out performed pre-cardioversion 1552 Pre-procedure VS bp 112/87, HR 107, Sa02 100% on 3L 02 per [place] ETCO2 21 1555 100J shock given by MD 1555 VS bp 108/79, HR 105, Sa02 100% on 4L02 per [place], ETCO2 24 1600 VS BP 107/79, HR 110, Sa02 100% on 6L 02 per [place], ETCO2 21 1600 150J shock 1601 VS 102/75, hr 115, Sa02 96% on 6L 02 per , ETCO2 20   1605 VS bp 117/86, HR 113, Sa02 100%, ETCO2 14

## 2018-11-12 NOTE — Consult Note (Addendum)
CARDIOLOGY CONSULT NOTE  Patient ID: Karina Tran MRN: [id number] DOB/AGE: 11-Jun-1940 78 y.o.  Admit date: 11/18/2018 Referring Physician  Gaetano Net, MD Primary Physician:  Jettie Booze, NP Reason for Consultation  A. Fib and Abnormal EKG  HPI:    Karina Tran  is a 78 y.o. female  with paroxysmal atrial fibrillation, history of atrial fibrillation ablation in 2012, atypical atrial flutter in 2016 and underwent cardioversion to sinus rhythm and was placed on flecainide and also anticoagulation was suggested by the EP.  She has done well all this years and has maintained sinus rhythm until she was admitted with acute cholecystitis sometime in September 2019 and did develop A. fib and CHF.  Her latest surgery was in January 30 11/28/2018 where she underwent total hip arthroplasty of the right hip.  She had been doing well until 130 or 2 AM early this morning, woke up with marked shortness of breath, palpitations and not feeling well and presented to the emergency room this morning.  In the ED she was found to be in A. fib with RVR.  Past medical history significant for hypertension, interstitial lung disease, history of breast cancer status post radiation therapy sometime in the year 2000, history of asbestos exposure, chronic iron deficiency anemia and history of GI bleed in 2016 due to internal hemorrhoids.  She denies chest pain, did complain of shortness of breath, palpitations.   Past Medical History:  Diagnosis Date  . Allergic rhinitis   . Breast cancer ([place])    left  . Dyspnea    chronic with exertion   . Dysrhythmia    a fib  had ablation in [place]   . GERD (gastroesophageal reflux disease)   . 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 History of GI bleed    last episode 02-02-16; was due to hemorroids   . History of kidney infection   . History of pulmonary hypertension    mild ; (see ECHO result in LOV with Dr. Kela Millin 06-18-17 on chart )  pt. denies at preop  . HLD  (hyperlipidemia)    denies   . Hypertension   . ILD (interstitial lung disease) ([place])   . Iron deficiency anemia    gets iv infusions  . Persistent atrial fibrillation    a. s/p PVI 2009 in [place]  . Raynaud phenomenon    no issues currently   . Spinal stenosis    lumbar     Past Surgical History:  Procedure Laterality Date  . ABLATION  2009   PVI in [place]  . afib ablation  10/06/2010  . APPENDECTOMY    . BREAST LUMPECTOMY Left 2007   malignant  . BREAST SURGERY Left 2007   partial mastectomy and lumpectomy   . CARDIOVERSION N/A 11/18/2014   Procedure: CARDIOVERSION;  Surgeon: Adrian Prows, MD;  Location: [facility];  Service: Cardiovascular;  Laterality: N/A;  . carpel tunnel    . CESAREAN SECTION    . GANGLION CYST EXCISION     x2  . HEMORRHOID SURGERY    . KYPHOPLASTY  2017  . TONSILLECTOMY    . TOTAL HIP ARTHROPLASTY Left 07/03/2017   Procedure: LEFT TOTAL HIP ARTHROPLASTY ANTERIOR APPROACH;  Surgeon: Paralee Cancel, MD;  Location: [facility];  Service: Orthopedics;  Laterality: Left;  70 mins  . TOTAL HIP ARTHROPLASTY Right 05/09/2018   Procedure: TOTAL HIP ARTHROPLASTY ANTERIOR APPROACH;  Surgeon: Paralee Cancel, MD;  Location: [facility];  Service: Orthopedics;  Laterality:  Right;  70 minutes    Social History   Socioeconomic History  . Marital status: Widowed    Spouse name: Not on file  . Number of children: Not on file  . Years of education: Not on file  . Highest education level: Not on file  Occupational History  . Not on file  Social Needs  . Financial resource strain: Not on file  . Food insecurity    Worry: Not on file    Inability: Not on file  . Transportation needs    Medical: Not on file    Non-medical: Not on file  Tobacco Use  . Smoking status: Passive Smoke Exposure - Never Smoker  . Smokeless tobacco: Never Used  . Tobacco comment: exposed to second-hand smoke her entire life  Substance and Sexual Activity  . Alcohol use: Yes    Comment: social ETOH,  beer   . Drug use: No  . Sexual activity: Not Currently  Lifestyle  . Physical activity    Days per week: Not on file    Minutes per session: Not on file  . Stress: Not on file  Relationships  . Social Herbalist on phone: Not on file    Gets together: Not on file    Attends religious service: Not on file    Active member of club or organization: Not on file    Attends meetings of clubs or organizations: Not on file    Relationship status: Not on file  . Intimate partner violence    Fear of current or ex partner: Not on file    Emotionally abused: Not on file    Physically abused: Not on file    Forced sexual activity: Not on file  Other Topics Concern  . Not on file  Social History Narrative   Live with daughter. Retired.       [facility]:   Originally from [place]. Moved to [place] in 2015. Living with daughter & son-in-law. Remote travel to [place]. 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 No pets currently. Remote parakeet exposure. No mold exposure. Previously enjoyed square dancing and now plays on her computer. Also previously enjoyed camping in her camper but has since sold it.     No current facility-administered medications on file prior to encounter.    Current Outpatient Medications on File Prior to Encounter  Medication Sig Dispense Refill  . amLODipine (NORVASC) 5 MG tablet Take 5 mg by mouth daily.    Marland Kitchen atenolol (TENORMIN) 50 MG tablet Take 50 mg by mouth daily.    . Calcium Carb-Cholecalciferol (CALCIUM 600+D3 PO) Take 1 tablet by mouth every morning.     . Coenzyme Q10 (CO Q10) 200 MG CAPS Take 200 mg by mouth every morning.     . flecainide (TAMBOCOR) 50 MG tablet Take 1 tablet (50 mg total) by mouth every 12 (twelve) hours. 60 tablet 0  . hydrochlorothiazide (HYDRODIURIL) 25 MG tablet Take 25 mg by mouth daily.    . Multiple Vitamins-Minerals (MULTIVITAMIN ADULT) CHEW  Chew 2 each by mouth daily.    . pantoprazole (PROTONIX) 40 MG tablet Take 40 mg by mouth every morning.     Marland Kitchen POTASSIUM GLUCONATE PO Take 75 mg by mouth daily.     . rivaroxaban (XARELTO) 20 MG TABS tablet Take 1 tablet (20 mg total) by mouth daily with supper. (Patient taking differently: Take 20 mg by mouth every  morning. ) 30 tablet 0  . docusate sodium (COLACE) 100 MG capsule Take 1 capsule (100 mg total) by mouth 2 (two) times daily. (Patient not taking: Reported on 11/22/2018) 10 capsule 0  . ferrous sulfate (FERROUSUL) 325 (65 FE) MG tablet Take 1 tablet (325 mg total) by mouth 3 (three) times daily with meals. (Patient not taking: Reported on 11/28/2018)  3   ROS   Review of Systems  Constitution: Positive for chills. Negative for decreased appetite, malaise/fatigue and weight gain.  Cardiovascular: Positive for dyspnea on exertion (worse since morning) and palpitations. Negative for leg swelling and syncope.  Endocrine: Negative for cold intolerance.  Hematologic/Lymphatic: Does not bruise/bleed easily.  Musculoskeletal: Positive for back pain (chronic). Negative for joint swelling.  Gastrointestinal: Positive for nausea. Negative for abdominal pain, anorexia, change in bowel habit, hematochezia and melena.  Neurological: Negative for headaches and light-headedness.  Psychiatric/Behavioral: Negative for depression and substance abuse.  All other systems reviewed and are negative.  Objective  Blood pressure (!) 62/48, pulse 81, temperature 98 F (36.7 C), temperature source Oral, resp. rate (!) 26, weight 68 kg, SpO2 97 %. Body mass index is 25.75 kg/m.  Physical Exam  Constitutional: She is oriented to person, place, and time. She appears well-nourished. She appears distressed (nausea, pale).  [place]  HENT:  Head: Atraumatic.  Eyes: Conjunctivae are normal.  Neck: Neck supple. No JVD present.  Cardiovascular: Normal rate. Exam reveals distant heart sounds. Exam reveals no  gallop.  No murmur heard. No edema. Peripheral pulse feeble.   Pulmonary/Chest: Effort normal.  Decreased breath sounds in the bases  Abdominal: Soft. Bowel sounds are normal.  Musculoskeletal: Normal range of motion.  Neurological: She is alert and oriented to person, place, and time.  Skin: There is pallor.  Psychiatric: She has a normal mood and affect.   Radiology   Dg Chest Portable 1 View  Result Date: 11/09/2018 CLINICAL DATA:  Shortness of breath. EXAM: PORTABLE CHEST 1 VIEW COMPARISON:  Chest radiograph 12/30/2017, FINDINGS: Cardiomegaly. Bibasilar opacities and blunting of the bilateral lateral costophrenic angles, more conspicuous on the left as compared to prior examination 12/30/2017. Findings likely reflect a combination of small bilateral pleural effusions with bibasilar atelectasis. A left lower lobe pneumonia is difficult to exclude. Chronic prominence of the interstitial lung markings. No acute bony abnormality. Thoracic dextrocurvature. IMPRESSION: Cardiomegaly. Bilateral pleural effusions with bibasilar atelectasis again demonstrated. Opacity at the left lung base is increased from prior examination 12/30/2017, and a left lower lobe pneumonia is difficult to exclude. Electronically Signed   By: Kellie Simmering   On: 11/21/2018 11:50   Laboratory Examination   CMP Latest Ref Rng & Units 11/25/2018 05/10/2018 05/01/2018  Glucose 70 - 99 mg/dL 187(H) 142(H) 97  BUN 8 - 23 mg/dL 8 15 12   Creatinine 0.44 - 1.00 mg/dL 1.40(H) 0.68 0.84  Sodium 135 - 145 mmol/L 135 135 138  Potassium 3.5 - 5.1 mmol/L 4.0 3.2(L) 4.0  Chloride 98 - 111 mmol/L 97(L) 101 101  CO2 22 - 32 mmol/L 18(L) 27 29  Calcium 8.9 - 10.3 mg/dL 9.0 8.4(L) 9.2  Total Protein 6.5 - 8.1 g/dL 6.1(L) - -  Total Bilirubin 0.3 - 1.2 mg/dL 2.7(H) - -  Alkaline Phos 38 - 126 U/L 72 - -  AST 15 - 41 U/L 50(H) - -  ALT 0 - 44 U/L 37 - -   CBC Latest Ref Rng & Units 12/03/2018 05/10/2018 05/01/2018  WBC 4.0 - 10.5 K/uL  9.6  16.9(H) 9.4  Hemoglobin 12.0 - 15.0 g/dL 13.7 11.5(L) 14.2  Hematocrit 36.0 - 46.0 % 42.9 36.8 45.5  Platelets 150 - 400 K/uL 170 243 269   Lipid Panel     Component Value Date/Time   CHOL 144 03/10/2015 0446   TRIG 37 03/10/2015 0446   HDL 68 03/10/2015 0446   CHOLHDL 2.1 03/10/2015 0446   VLDL 7 03/10/2015 0446   LDLCALC 69 03/10/2015 0446   HEMOGLOBIN A1C Lab Results  Component Value Date   HGBA1C 5.4 12/23/2017   MPG 108 12/23/2017   TSH No results for input(s): TSH in the last 8760 hours. Cardiac Panel (last 3 results)  Medications:  Scheduled Meds: . rivaroxaban  20 mg Oral Daily   Continuous Infusions: . sodium chloride    . DOPamine Stopped (11/10/2018 1846)  . norepinephrine (LEVOPHED) Adult infusion 10 mcg/min (11/18/2018 1844)   PRN Meds:. Medications Discontinued During This Encounter  Medication Reason  . HYDROcodone-acetaminophen (NORCO/VICODIN) 5-325 MG tablet Completed Course  . methocarbamol (ROBAXIN) 500 MG tablet Patient Preference  . polyethylene glycol (MIRALAX / GLYCOLAX) packet Completed Course  . diltiazem (CARDIZEM) 100 mg in dextrose 5% 146mL (1 mg/mL) infusion   . diltiazem (CARDIZEM) tablet 120 mg    Current Meds  Medication Sig  . amLODipine (NORVASC) 5 MG tablet Take 5 mg by mouth daily.  Marland Kitchen atenolol (TENORMIN) 50 MG tablet Take 50 mg by mouth daily.  . Calcium Carb-Cholecalciferol (CALCIUM 600+D3 PO) Take 1 tablet by mouth every morning.   . Coenzyme Q10 (CO Q10) 200 MG CAPS Take 200 mg by mouth every morning.   . flecainide (TAMBOCOR) 50 MG tablet Take 1 tablet (50 mg total) by mouth every 12 (twelve) hours.  . hydrochlorothiazide (HYDRODIURIL) 25 MG tablet Take 25 mg by mouth daily.  . Multiple Vitamins-Minerals (MULTIVITAMIN ADULT) CHEW Chew 2 each by mouth daily.  . pantoprazole (PROTONIX) 40 MG tablet Take 40 mg by mouth every morning.   Marland Kitchen POTASSIUM GLUCONATE PO Take 75 mg by mouth daily.   . rivaroxaban (XARELTO) 20 MG TABS tablet  Take 1 tablet (20 mg total) by mouth daily with supper. (Patient taking differently: Take 20 mg by mouth every morning. )    Cardiac studies  Echocardiogram 01/13/2016: Normal LV size, mild LVH, grade 2 diastolic dysfunction EF 32%. Left atrial cavity moderately severely dilated.  Moderate aortic regurgitation Mild to moderate mitral regurgitation Mild to moderate tricuspid regurgitation with mild pulmonary hypertension PA pressure 33 mmHg.  Echocardiogram 12/23/2017 :   Left ventricle: The cavity size was normal. Wall thickness was   increased in a pattern of mild LVH. Systolic function was mildly   reduced. The estimated ejection fraction was 45%. Diffuse   hypokinesis. Features are consistent with a pseudonormal left   ventricular filling pattern, with concomitant abnormal relaxation   and increased filling pressure (grade 2 diastolic dysfunction). - Aortic valve: Mildly thickened leaflets. There was mild   regurgitation. - Mitral valve: There was mild regurgitation. - Left atrium: The atrium was moderately dilated. - Right ventricle: Systolic function was mildly reduced. - Right atrium: The atrium was moderately dilated. - Atrial septum: No defect or patent foramen ovale was identified. - Tricuspid valve: There was moderate regurgitation. - Pulmonary arteries: PA peak pressure: 33 mm Hg (S). - Inferior vena cava: The vessel was dilated. The respirophasic   diameter changes were blunted (< 50%), consistent with elevated   central venous pressure. Estimated CVP 15 mmHg.  ED course:   In the emergency room patient received IV diltiazem 5 mg and also as rate was still uncontrolled, and she has been on Xarelto, it is felt that cardioversion would be appropriate, she received 100 mg of flecainide 30 minutes prior to cardioversion, underwent direct-current cardioversion at 2020 and also 150 J without success performed by Dr. Baird Kay.  Advised him to give 100 mg of atenolol for rate  control and recommended diltiazem 120 mg CD, patient only received 100 mg of atenolol.  Patient's blood pressure has been stable around systolic 366Q to 947 mmHg, I came down to see the patient in the ED, upon my entry, patient was not feeling well, stated that she is nauseous, her systolic blood pressure dropped to 60 mmHg.  EKG also revealed marked abnormality suggestive of anteroseptal ischemia with prolonged QT and T wave inversion.  This was a remarkable and drastic change from baseline.  She denied chest pain, denied any dyspnea that is worse, but complained of nausea that has been persistent since early morning.  While we are resuscitating her with IV fluids and also starting dopamine, she had an episode of what appeared to be a grand mal seizure with wide open eyes, lateral deviation to the left, unresponsiveness and she also bit her lip.  This lasted for approximately 30 to 40 seconds, she did not have a postictal state.  She was alert and oriented and including remembering her name and date. Patient was started on dopamine and eventually switched over to Levophed in view of suspicion for sepsis as well.  Assessment  1.  Hypotension and shock, patient acutely became ill in the matter of few minutes where her systolic blood pressure was well maintained and suddenly plummeted. 2.  Interstitial lung disease probably related to prior radiation therapy to chest from breast cancer in 2000, history of asbestos exposure.  Chest x-ray personally reviewed, compared to prior EKG about 6 months ago, I cannot completely exclude bilateral basal infiltrates suggestive of pneumonia.  Pulmonary vascular congestion is also noted but does not appear to be as much pronounced. 3.  Paroxysmal atrial fibrillation CHA2DS2-VASc Score is 4.  Yearly risk of stroke: 4%.  Score of 1=1.3; 2=2.2; 3=3.2; 4=4; 5=6.7; 6=9.8; 7=>9.8) -(CHF; HTN; vasc disease DM,  Female = 1; Age <65 =0; 65-74 = 1,  >75 =2; stroke = 2).   4.   Abnormal EKG EKG 12/09/2018 at 11:16 hrs.: Atrial fibrillation with rapid ventricular response at the rate of 135 bpm, right axis deviation, incomplete right bundle branch block.  Consider RVH.  Nonspecific T abnormality.  Repeat EKG at 1835 hrs.: Probable ectopic atrial rhythm versus sinus rhythm with first-degree AV block at rate of 82 bpm, right axis deviation, incomplete right bundle branch block, consider RVH.  Marked T wave abnormality, consider anteroseptal ischemia.  Prolonged QT.  I reviewed multiple EKGs, there was gradually improving T wave abnormality noted with stabilization of blood pressure.  Plan:  After review of the presentation, I am not sure why she became hypotensive just 15 minutes after she took atenolol 100 mg.  It is too quick to explain her marked hypotension that is unresponsive to pressors.  She also has markedly abnormal EKG probably suggesting demand ischemia due to hypotension, she probably will need cardiac catheterization once she is medically stable.  However it is surprising that in spite of A. fib with RVR with heart rate around 140 bpm, she did not have ischemic-looking T wave abnormalities.  Her last stress test was greater than 5 to 6 years ago which was nonischemic. In view of underlying dyspnea, chills, lung infiltrate, COVID-19 needs to be excluded and rapid swab test was sent.  Initially I was told that she was not on flecainide but on further questioning, patient admits that she has been taking flecainide and the bottle is empty and the last dose was yesterday.  I will check flecainide levels as well.  I also reviewed her presentation with Dr. Gaetano Net and patient will be admitted by PCCM and I will continue to follow the patient.  She will need high sensitive serum troponin evaluation, initially her troponins have been negative/flat without delta.  Echocardiogram will be ordered.  She does have mild renal insufficiency today, previously she has had stage  III chronic kidney disease.  The renal function appears to have slightly deteriorated upon presentation as well.  She also has markedly abnormal BNP and may suggest acute on chronic diastolic heart failure.  She will need follow-up BMP.  Clinically she did not appear to be in acute decompensated heart failure.  Pulmonary embolism is unlikely as patient was on anticoagulation with Xarelto.  Critical care time 60 minutes, I did supervise the care of the patient directly and also for coordination of care.   Adrian Prows, MD, [facility] 11/18/2018, 6:48 PM [facility]. [place] Pager: [phone] Office: [phone] If no answer Cell [phone]

## 2018-11-13 ENCOUNTER — Inpatient Hospital Stay (HOSPITAL_COMMUNITY): Payer: Medicare Other

## 2018-11-13 ENCOUNTER — Other Ambulatory Visit: Payer: Self-pay

## 2018-11-13 ENCOUNTER — Encounter (HOSPITAL_COMMUNITY): Payer: Self-pay | Admitting: Cardiology

## 2018-11-13 DIAGNOSIS — J9601 Acute respiratory failure with hypoxia: Secondary | ICD-10-CM

## 2018-11-13 DIAGNOSIS — I4891 Unspecified atrial fibrillation: Secondary | ICD-10-CM

## 2018-11-13 DIAGNOSIS — I469 Cardiac arrest, cause unspecified: Secondary | ICD-10-CM

## 2018-11-13 DIAGNOSIS — I472 Ventricular tachycardia: Secondary | ICD-10-CM

## 2018-11-13 DIAGNOSIS — I509 Heart failure, unspecified: Secondary | ICD-10-CM

## 2018-11-13 LAB — POCT I-STAT 7, (LYTES, BLD GAS, ICA,H+H)
Acid-base deficit: 1 mmol/L (ref 0.0–2.0)
Acid-base deficit: 13 mmol/L — ABNORMAL HIGH (ref 0.0–2.0)
Acid-base deficit: 13 mmol/L — ABNORMAL HIGH (ref 0.0–2.0)
Acid-base deficit: 16 mmol/L — ABNORMAL HIGH (ref 0.0–2.0)
Acid-base deficit: 16 mmol/L — ABNORMAL HIGH (ref 0.0–2.0)
Acid-base deficit: 16 mmol/L — ABNORMAL HIGH (ref 0.0–2.0)
Acid-base deficit: 17 mmol/L — ABNORMAL HIGH (ref 0.0–2.0)
Bicarbonate: 10.4 mmol/L — ABNORMAL LOW (ref 20.0–28.0)
Bicarbonate: 11.7 mmol/L — ABNORMAL LOW (ref 20.0–28.0)
Bicarbonate: 22.2 mmol/L (ref 20.0–28.0)
Bicarbonate: 7.8 mmol/L — ABNORMAL LOW (ref 20.0–28.0)
Bicarbonate: 8 mmol/L — ABNORMAL LOW (ref 20.0–28.0)
Bicarbonate: 8.3 mmol/L — ABNORMAL LOW (ref 20.0–28.0)
Bicarbonate: 8.6 mmol/L — ABNORMAL LOW (ref 20.0–28.0)
Calcium, Ion: 0.85 mmol/L — CL (ref 1.15–1.40)
Calcium, Ion: 0.86 mmol/L — CL (ref 1.15–1.40)
Calcium, Ion: 0.87 mmol/L — CL (ref 1.15–1.40)
Calcium, Ion: 0.87 mmol/L — CL (ref 1.15–1.40)
Calcium, Ion: 0.93 mmol/L — ABNORMAL LOW (ref 1.15–1.40)
Calcium, Ion: 1.01 mmol/L — ABNORMAL LOW (ref 1.15–1.40)
Calcium, Ion: 1.06 mmol/L — ABNORMAL LOW (ref 1.15–1.40)
HCT: 27 % — ABNORMAL LOW (ref 36.0–46.0)
HCT: 31 % — ABNORMAL LOW (ref 36.0–46.0)
HCT: 37 % (ref 36.0–46.0)
HCT: 38 % (ref 36.0–46.0)
HCT: 41 % (ref 36.0–46.0)
HCT: 42 % (ref 36.0–46.0)
HCT: 45 % (ref 36.0–46.0)
Hemoglobin: 10.5 g/dL — ABNORMAL LOW (ref 12.0–15.0)
Hemoglobin: 12.6 g/dL (ref 12.0–15.0)
Hemoglobin: 12.9 g/dL (ref 12.0–15.0)
Hemoglobin: 13.9 g/dL (ref 12.0–15.0)
Hemoglobin: 14.3 g/dL (ref 12.0–15.0)
Hemoglobin: 15.3 g/dL — ABNORMAL HIGH (ref 12.0–15.0)
Hemoglobin: 9.2 g/dL — ABNORMAL LOW (ref 12.0–15.0)
O2 Saturation: 100 %
O2 Saturation: 100 %
O2 Saturation: 100 %
O2 Saturation: 95 %
O2 Saturation: 97 %
O2 Saturation: 99 %
O2 Saturation: 99 %
Patient temperature: 36
Patient temperature: 37
Patient temperature: 37.2
Patient temperature: 37.2
Potassium: 3.9 mmol/L (ref 3.5–5.1)
Potassium: 4.8 mmol/L (ref 3.5–5.1)
Potassium: 4.8 mmol/L (ref 3.5–5.1)
Potassium: 4.8 mmol/L (ref 3.5–5.1)
Potassium: 4.9 mmol/L (ref 3.5–5.1)
Potassium: 4.9 mmol/L (ref 3.5–5.1)
Potassium: 5 mmol/L (ref 3.5–5.1)
Sodium: 140 mmol/L (ref 135–145)
Sodium: 140 mmol/L (ref 135–145)
Sodium: 141 mmol/L (ref 135–145)
Sodium: 142 mmol/L (ref 135–145)
Sodium: 145 mmol/L (ref 135–145)
Sodium: 145 mmol/L (ref 135–145)
Sodium: 149 mmol/L — ABNORMAL HIGH (ref 135–145)
TCO2: 11 mmol/L — ABNORMAL LOW (ref 22–32)
TCO2: 12 mmol/L — ABNORMAL LOW (ref 22–32)
TCO2: 23 mmol/L (ref 22–32)
TCO2: 8 mmol/L — ABNORMAL LOW (ref 22–32)
TCO2: 8 mmol/L — ABNORMAL LOW (ref 22–32)
TCO2: 9 mmol/L — ABNORMAL LOW (ref 22–32)
TCO2: 9 mmol/L — ABNORMAL LOW (ref 22–32)
pCO2 arterial: 17 mmHg — CL (ref 32.0–48.0)
pCO2 arterial: 17.5 mmHg — CL (ref 32.0–48.0)
pCO2 arterial: 19.1 mmHg — CL (ref 32.0–48.0)
pCO2 arterial: 19.2 mmHg — CL (ref 32.0–48.0)
pCO2 arterial: 19.5 mmHg — CL (ref 32.0–48.0)
pCO2 arterial: 23.9 mmHg — ABNORMAL LOW (ref 32.0–48.0)
pCO2 arterial: 29.9 mmHg — ABNORMAL LOW (ref 32.0–48.0)
pH, Arterial: 7.222 — ABNORMAL LOW (ref 7.350–7.450)
pH, Arterial: 7.255 — ABNORMAL LOW (ref 7.350–7.450)
pH, Arterial: 7.276 — ABNORMAL LOW (ref 7.350–7.450)
pH, Arterial: 7.279 — ABNORMAL LOW (ref 7.350–7.450)
pH, Arterial: 7.298 — ABNORMAL LOW (ref 7.350–7.450)
pH, Arterial: 7.342 — ABNORMAL LOW (ref 7.350–7.450)
pH, Arterial: 7.479 — ABNORMAL HIGH (ref 7.350–7.450)
pO2, Arterial: 104 mmHg (ref 83.0–108.0)
pO2, Arterial: 117 mmHg — ABNORMAL HIGH (ref 83.0–108.0)
pO2, Arterial: 144 mmHg — ABNORMAL HIGH (ref 83.0–108.0)
pO2, Arterial: 417 mmHg — ABNORMAL HIGH (ref 83.0–108.0)
pO2, Arterial: 516 mmHg — ABNORMAL HIGH (ref 83.0–108.0)
pO2, Arterial: 537 mmHg — ABNORMAL HIGH (ref 83.0–108.0)
pO2, Arterial: 82 mmHg — ABNORMAL LOW (ref 83.0–108.0)

## 2018-11-13 LAB — CBC
HCT: 42.7 % (ref 36.0–46.0)
HCT: 43.9 % (ref 36.0–46.0)
HCT: 41.8 % (ref 36.0–46.0)
Hemoglobin: 13.4 g/dL (ref 12.0–15.0)
Hemoglobin: 13.3 g/dL (ref 12.0–15.0)
Hemoglobin: 12.7 g/dL (ref 12.0–15.0)
MCH: 31.7 pg (ref 26.0–34.0)
MCH: 31.4 pg (ref 26.0–34.0)
MCH: 31.6 pg (ref 26.0–34.0)
MCHC: 31.4 g/dL (ref 30.0–36.0)
MCHC: 30.3 g/dL (ref 30.0–36.0)
MCHC: 30.4 g/dL (ref 30.0–36.0)
MCV: 100.9 fL — ABNORMAL HIGH (ref 80.0–100.0)
MCV: 103.8 fL — ABNORMAL HIGH (ref 80.0–100.0)
MCV: 104 fL — ABNORMAL HIGH (ref 80.0–100.0)
Platelets: 144 10*3/uL — ABNORMAL LOW (ref 150–400)
Platelets: 123 10*3/uL — ABNORMAL LOW (ref 150–400)
Platelets: 128 10*3/uL — ABNORMAL LOW (ref 150–400)
RBC: 4.23 MIL/uL (ref 3.87–5.11)
RBC: 4.23 MIL/uL (ref 3.87–5.11)
RBC: 4.02 MIL/uL (ref 3.87–5.11)
RDW: 16.5 % — ABNORMAL HIGH (ref 11.5–15.5)
RDW: 16.6 % — ABNORMAL HIGH (ref 11.5–15.5)
RDW: 16.5 % — ABNORMAL HIGH (ref 11.5–15.5)
WBC: 18.2 10*3/uL — ABNORMAL HIGH (ref 4.0–10.5)
WBC: 23 10*3/uL — ABNORMAL HIGH (ref 4.0–10.5)
WBC: 19 10*3/uL — ABNORMAL HIGH (ref 4.0–10.5)
nRBC: 0.7 % — ABNORMAL HIGH (ref 0.0–0.2)
nRBC: 0.6 % — ABNORMAL HIGH (ref 0.0–0.2)
nRBC: 1.8 % — ABNORMAL HIGH (ref 0.0–0.2)

## 2018-11-13 LAB — HEPATIC FUNCTION PANEL
ALT: 2271 U/L — ABNORMAL HIGH (ref 0–44)
AST: 6160 U/L — ABNORMAL HIGH (ref 15–41)
Albumin: 1.6 g/dL — ABNORMAL LOW (ref 3.5–5.0)
Alkaline Phosphatase: 65 U/L (ref 38–126)
Bilirubin, Direct: 2.3 mg/dL — ABNORMAL HIGH (ref 0.0–0.2)
Indirect Bilirubin: 2.5 mg/dL — ABNORMAL HIGH (ref 0.3–0.9)
Total Bilirubin: 4.8 mg/dL — ABNORMAL HIGH (ref 0.3–1.2)
Total Protein: 3.4 g/dL — ABNORMAL LOW (ref 6.5–8.1)

## 2018-11-13 LAB — POCT I-STAT, CHEM 8
BUN: 10 mg/dL (ref 8–23)
Calcium, Ion: 0.87 mmol/L — CL (ref 1.15–1.40)
Chloride: 110 mmol/L (ref 98–111)
Creatinine, Ser: 1.6 mg/dL — ABNORMAL HIGH (ref 0.44–1.00)
Glucose, Bld: <20 mg/dL — CL (ref 70–99)
HCT: 43 % (ref 36.0–46.0)
Hemoglobin: 14.6 g/dL (ref 12.0–15.0)
Potassium: 5 mmol/L (ref 3.5–5.1)
Sodium: 141 mmol/L (ref 135–145)
TCO2: 10 mmol/L — ABNORMAL LOW (ref 22–32)

## 2018-11-13 LAB — POCT ACTIVATED CLOTTING TIME
Activated Clotting Time: 252 seconds
Activated Clotting Time: 191 seconds
Activated Clotting Time: 186 seconds
Activated Clotting Time: 191 seconds
Activated Clotting Time: 197 seconds
Activated Clotting Time: 197 seconds
Activated Clotting Time: 219 seconds
Activated Clotting Time: 180 seconds
Activated Clotting Time: 197 seconds
Activated Clotting Time: 219 seconds
Activated Clotting Time: 230 seconds
Activated Clotting Time: 230 seconds
Activated Clotting Time: 285 seconds

## 2018-11-13 LAB — BASIC METABOLIC PANEL
Anion gap: 24 — ABNORMAL HIGH (ref 5–15)
Anion gap: 30 — ABNORMAL HIGH (ref 5–15)
BUN: 10 mg/dL (ref 8–23)
BUN: 9 mg/dL (ref 8–23)
BUN: 9 mg/dL (ref 8–23)
CO2: 7 mmol/L — ABNORMAL LOW (ref 22–32)
CO2: <7 mmol/L — ABNORMAL LOW (ref 22–32)
CO2: 7 mmol/L — ABNORMAL LOW (ref 22–32)
Calcium: 6.8 mg/dL — ABNORMAL LOW (ref 8.9–10.3)
Calcium: 6.5 mg/dL — ABNORMAL LOW (ref 8.9–10.3)
Calcium: 7.3 mg/dL — ABNORMAL LOW (ref 8.9–10.3)
Chloride: 110 mmol/L (ref 98–111)
Chloride: 113 mmol/L — ABNORMAL HIGH (ref 98–111)
Chloride: 108 mmol/L (ref 98–111)
Creatinine, Ser: 1.59 mg/dL — ABNORMAL HIGH (ref 0.44–1.00)
Creatinine, Ser: 1.91 mg/dL — ABNORMAL HIGH (ref 0.44–1.00)
Creatinine, Ser: 2.23 mg/dL — ABNORMAL HIGH (ref 0.44–1.00)
GFR calc Af Amer: 35 mL/min — ABNORMAL LOW (ref >60)
GFR calc Af Amer: 28 mL/min — ABNORMAL LOW (ref >60)
GFR calc Af Amer: 24 mL/min — ABNORMAL LOW (ref >60)
GFR calc non Af Amer: 31 mL/min — ABNORMAL LOW (ref >60)
GFR calc non Af Amer: 24 mL/min — ABNORMAL LOW (ref >60)
GFR calc non Af Amer: 20 mL/min — ABNORMAL LOW (ref >60)
Glucose, Bld: 51 mg/dL — ABNORMAL LOW (ref 70–99)
Glucose, Bld: 56 mg/dL — ABNORMAL LOW (ref 70–99)
Glucose, Bld: 115 mg/dL — ABNORMAL HIGH (ref 70–99)
Potassium: 4.7 mmol/L (ref 3.5–5.1)
Potassium: 4.8 mmol/L (ref 3.5–5.1)
Potassium: 4.1 mmol/L (ref 3.5–5.1)
Sodium: 141 mmol/L (ref 135–145)
Sodium: 141 mmol/L (ref 135–145)
Sodium: 145 mmol/L (ref 135–145)

## 2018-11-13 LAB — POCT I-STAT EG7
Acid-base deficit: 9 mmol/L — ABNORMAL HIGH (ref 0.0–2.0)
Bicarbonate: 18.1 mmol/L — ABNORMAL LOW (ref 20.0–28.0)
Calcium, Ion: 0.89 mmol/L — CL (ref 1.15–1.40)
HCT: 39 % (ref 36.0–46.0)
Hemoglobin: 13.3 g/dL (ref 12.0–15.0)
O2 Saturation: 29 %
Potassium: 4.7 mmol/L (ref 3.5–5.1)
Sodium: 144 mmol/L (ref 135–145)
TCO2: 19 mmol/L — ABNORMAL LOW (ref 22–32)
pCO2, Ven: 41.9 mmHg — ABNORMAL LOW (ref 44.0–60.0)
pH, Ven: 7.245 — ABNORMAL LOW (ref 7.250–7.430)
pO2, Ven: 22 mmHg — CL (ref 32.0–45.0)

## 2018-11-13 LAB — PHOSPHORUS
Phosphorus: 7.1 mg/dL — ABNORMAL HIGH (ref 2.5–4.6)
Phosphorus: 6.8 mg/dL — ABNORMAL HIGH (ref 2.5–4.6)

## 2018-11-13 LAB — COOXEMETRY PANEL
Carboxyhemoglobin: 0.6 % (ref 0.5–1.5)
Methemoglobin: 0.9 % (ref 0.0–1.5)
O2 Saturation: 58.3 %
Total hemoglobin: 12.7 g/dL (ref 12.0–16.0)

## 2018-11-13 LAB — GLUCOSE, CAPILLARY
Glucose-Capillary: 254 mg/dL — ABNORMAL HIGH (ref 70–99)
Glucose-Capillary: 105 mg/dL — ABNORMAL HIGH (ref 70–99)
Glucose-Capillary: 141 mg/dL — ABNORMAL HIGH (ref 70–99)

## 2018-11-13 LAB — CORTISOL: Cortisol, Plasma: 21.8 ug/dL

## 2018-11-13 LAB — BRAIN NATRIURETIC PEPTIDE
B Natriuretic Peptide: 3546.6 pg/mL — ABNORMAL HIGH (ref 0.0–100.0)
B Natriuretic Peptide: >4500 pg/mL — ABNORMAL HIGH (ref 0.0–100.0)

## 2018-11-13 LAB — ECHOCARDIOGRAM LIMITED
Height: 64 in
Weight: 2737.23 oz

## 2018-11-13 LAB — TSH: TSH: 5.101 u[IU]/mL — ABNORMAL HIGH (ref 0.350–4.500)

## 2018-11-13 LAB — TROPONIN I (HIGH SENSITIVITY): Troponin I (High Sensitivity): 1296 ng/L (ref <18)

## 2018-11-13 LAB — APTT: aPTT: 124 seconds — ABNORMAL HIGH (ref 24–36)

## 2018-11-13 LAB — LACTIC ACID, PLASMA
Lactic Acid, Venous: >11 mmol/L (ref 0.5–1.9)
Lactic Acid, Venous: >11 mmol/L (ref 0.5–1.9)
Lactic Acid, Venous: >11 mmol/L (ref 0.5–1.9)

## 2018-11-13 LAB — LACTATE DEHYDROGENASE: LDH: >10000 U/L — ABNORMAL HIGH (ref 98–192)

## 2018-11-13 LAB — MRSA PCR SCREENING: MRSA by PCR: NEGATIVE

## 2018-11-13 LAB — MAGNESIUM
Magnesium: 2.2 mg/dL (ref 1.7–2.4)
Magnesium: 2.1 mg/dL (ref 1.7–2.4)

## 2018-11-13 LAB — T4, FREE: Free T4: 0.95 ng/dL (ref 0.61–1.12)

## 2018-11-13 MED ORDER — MIDAZOLAM HCL 2 MG/2ML IJ SOLN
2.0000 mg | Freq: Once | INTRAMUSCULAR | Status: AC
  Administered 2018-11-13: 2 mg via INTRAVENOUS

## 2018-11-13 MED ORDER — DEXTROSE 50 % IV SOLN
50.0000 mL | Freq: Once | INTRAVENOUS | Status: AC
  Administered 2018-11-13 (×2): 50 mL via INTRAVENOUS

## 2018-11-13 MED ORDER — SODIUM BICARBONATE 8.4 % IV SOLN
INTRAVENOUS | Status: AC
  Filled 2018-11-13: qty 100

## 2018-11-13 MED ORDER — FENTANYL CITRATE (PF) 100 MCG/2ML IJ SOLN
100.0000 ug | Freq: Once | INTRAMUSCULAR | Status: AC
  Administered 2018-11-13: 100 ug via INTRAVENOUS

## 2018-11-13 MED ORDER — ORAL CARE MOUTH RINSE
15.0000 mL | OROMUCOSAL | Status: DC
  Administered 2018-11-13 (×3): 15 mL via OROMUCOSAL

## 2018-11-13 MED ORDER — CHLORHEXIDINE GLUCONATE CLOTH 2 % EX PADS
6.0000 | MEDICATED_PAD | Freq: Every day | CUTANEOUS | Status: DC
  Administered 2018-11-13: 6 via TOPICAL

## 2018-11-13 MED ORDER — CHLORHEXIDINE GLUCONATE CLOTH 2 % EX PADS: 6.0000 | MEDICATED_PAD | Freq: Every day | CUTANEOUS | Status: DC

## 2018-11-13 MED ORDER — SODIUM BICARBONATE 8.4 % IV SOLN
50.0000 meq | Freq: Once | INTRAVENOUS | Status: AC
  Administered 2018-11-13: 50 meq via INTRAVENOUS

## 2018-11-13 MED ORDER — SODIUM CHLORIDE 0.9% FLUSH
10.0000 mL | Freq: Two times a day (BID) | INTRAVENOUS | Status: DC
  Administered 2018-11-13: 10 mL

## 2018-11-13 MED ORDER — DEXTROSE 50 % IV SOLN
INTRAVENOUS | Status: AC
  Administered 2018-11-13: 50 mL via INTRAVENOUS
  Filled 2018-11-13: qty 50

## 2018-11-13 MED ORDER — SODIUM BICARBONATE 8.4 % IV SOLN
100.0000 meq | Freq: Once | INTRAVENOUS | Status: AC
  Administered 2018-11-13: 100 meq via INTRAVENOUS

## 2018-11-13 MED ORDER — SODIUM BICARBONATE 8.4 % IV SOLN
INTRAVENOUS | Status: DC
  Administered 2018-11-13 (×2): via INTRAVENOUS
  Filled 2018-11-13: qty 150
  Filled 2018-11-13: qty 50

## 2018-11-13 MED ORDER — CHLORHEXIDINE GLUCONATE 0.12% ORAL RINSE (MEDLINE KIT)
15.0000 mL | Freq: Two times a day (BID) | OROMUCOSAL | Status: DC
  Administered 2018-11-13: 15 mL via OROMUCOSAL

## 2018-11-13 MED ORDER — SODIUM CHLORIDE 0.9% FLUSH: 10.0000 mL | INTRAVENOUS | Status: DC | PRN

## 2018-11-13 MED ORDER — MIDAZOLAM BOLUS VIA INFUSION
5.0000 mg | Freq: Once | INTRAVENOUS | Status: AC
  Administered 2018-11-13: 5 mg via INTRAVENOUS

## 2018-11-13 MED ORDER — FENTANYL BOLUS VIA INFUSION
200.0000 ug | Freq: Once | INTRAVENOUS | Status: AC
  Administered 2018-11-13: 18:00:00 200 ug via INTRAVENOUS

## 2018-11-13 MED ORDER — NOREPINEPHRINE 16 MG/250ML-% IV SOLN
0.0000 ug/min | INTRAVENOUS | Status: DC
  Administered 2018-11-13: 60 ug/min via INTRAVENOUS
  Administered 2018-11-13: 58 ug/min via INTRAVENOUS
  Administered 2018-11-13: 10 ug/min via INTRAVENOUS
  Filled 2018-11-13 (×5): qty 250

## 2018-11-13 MED ORDER — EPINEPHRINE PF 1 MG/ML IJ SOLN
0.5000 ug/min | INTRAVENOUS | Status: DC
  Administered 2018-11-13: 20 ug/min via INTRAVENOUS
  Administered 2018-11-13: 15 ug/min via INTRAVENOUS
  Administered 2018-11-13: 4 ug/min via INTRAVENOUS
  Administered 2018-11-13: 20 ug/min via INTRAVENOUS
  Filled 2018-11-13 (×6): qty 4

## 2018-11-13 MED ORDER — SODIUM BICARBONATE 8.4 % IV SOLN
INTRAVENOUS | Status: AC
  Filled 2018-11-13: qty 50

## 2018-11-13 MED FILL — Medication: Qty: 1 | Status: AC

## 2018-11-14 LAB — CALCIUM, IONIZED: Calcium, Ionized, Serum: 3.3 mg/dL — ABNORMAL LOW (ref 4.5–5.6)

## 2018-11-14 LAB — FLECAINIDE LEVEL: Flecainide: 0.43 ug/mL (ref 0.20–1.00)

## 2018-11-14 MED FILL — Epinephrine Soln Prefilled Syringe 1 MG/10ML (0.1 MG/ML): INTRAMUSCULAR | Qty: 10 | Status: AC

## 2018-11-14 MED FILL — Heparin Sod (Porcine)-NaCl IV Soln 1000 Unit/500ML-0.9%: INTRAVENOUS | Qty: 1500 | Status: AC

## 2018-11-15 LAB — HEMOGLOBIN FREE, PLASMA: Hgb, Plasma: 192.2 mg/dL — ABNORMAL HIGH (ref 0.0–4.9)

## 2018-11-18 ENCOUNTER — Encounter (HOSPITAL_COMMUNITY): Payer: Self-pay | Admitting: *Deleted

## 2018-11-18 NOTE — Progress Notes (Signed)
Received copy of pt's death certificate faxed to [place] from [facility].  DC completed and signed by Dr Haroldine Laws and faxed back to them at [phone]

## 2018-12-10 NOTE — Progress Notes (Signed)
ANTICOAGULATION CONSULT NOTE - Initial Consult  Pharmacy Consult for Heparin Indication: Impella  Allergies  Allergen Reactions  . Aspirin Other (See Comments)    Bloody noses  . Blue Dyes (Parenteral) Nausea And Vomiting  . Demerol [Meperidine] Nausea And Vomiting    Patient Measurements: Height: 5\' 4"  (162.6 cm) Weight: 171 lb 1.2 oz (77.6 kg) IBW/kg (Calculated) : 54.7 Heparin Dosing Weight: 68 kg  Vital Signs: Temp: 98.8 F (37.1 C) (08/05 1215) Temp Source: Core (08/05 1200) BP: 53/26 (08/05 1200) Pulse Rate: 122 (08/05 1130)  Labs: Recent Labs    11/11/2018 1326 11/12/18 1903  December 01, 2018 0049  01-Dec-2018 0506 2018-12-01 0651 2018-12-01 0745  01-Dec-2018 0827 12-01-18 1036 12/01/18 1043  HGB  --   --    < > 13.4   < > 14.6 13.3  --    < > 10.5* 12.6 12.7  HCT  --   --    < > 42.7   < > 43.0 43.9  --    < > 31.0* 37.0 41.8  PLT  --   --   --  144*  --   --  123*  --   --   --   --  128*  APTT  --   --   --   --   --   --   --  124*  --   --   --   --   CREATININE  --   --   --  1.59*  --  1.60* 1.91*  --   --   --   --  2.23*  TROPONINIHS 48* 60*  --  1,296*  --   --   --   --   --   --   --   --    < > = values in this interval not displayed.    Estimated Creatinine Clearance: 20.6 mL/min (A) (by C-G formula based on SCr of 2.23 mg/dL (H)).   Medical History: Past Medical History:  Diagnosis Date  . Allergic rhinitis   . Breast cancer ([place])    left  . Dyspnea    chronic with exertion   . Dysrhythmia    a fib  had ablation in [place]   . GERD (gastroesophageal reflux disease)   . 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 History of GI bleed    last episode 02-02-16; was due to hemorroids   . History of kidney infection   . History of pulmonary hypertension    mild ; (see ECHO result in LOV with Dr. Kela Millin 06-18-17 on chart )  pt. denies at preop  . HLD (hyperlipidemia)    denies   . Hypertension   . ILD (interstitial lung disease) ([place])   . Iron deficiency anemia    gets iv infusions  . Persistent atrial fibrillation    a. s/p PVI 2009 in [place]  . Raynaud phenomenon    no issues currently   . Spinal stenosis    lumbar     Medications:  Infusions:  . sodium chloride 10 mL/hr at 2018-12-01 1200  . epinephrine 20 mcg/min (01-Dec-2018 1200)  . fentaNYL infusion INTRAVENOUS Stopped (2018-12-01 9735)  . impella catheter heparin 50 unit/mL in dextrose 5%    . heparin Stopped (12-01-2018 0116)  . midazolam Stopped (01-Dec-2018 3299)  . norepinephrine (LEVOPHED) Adult infusion 60 mcg/min (12-01-18 1200)  .  sodium bicarbonate  infusion 1000 mL 100 mL/hr at 2018/12/01 1200  .  vasopressin (PITRESSIN) infusion - *FOR SHOCK* 0.03 Units/min (11/29/18 1200)    Assessment: 78 yo F presenting with rapid Afib with HR in 190s. Patient decompensated and Impella was placed. Pharmacy has been consulted to dose Heparin.  500 units/hr of Heparin is running in the purge solution. ACT (197) greater than goal of 160-180. Patient could be slow to clear given shock and organ failure. Heparin was given in cath overnight. Peripheral APTT 124. Patient is not receiving peripheral heparin at this time. Pharmacy will continue to monitor along with nursing.  Goal of Therapy:  ACT 160-180 Monitor platelets by anticoagulation protocol: Yes   Plan:  Continue to monitor ACTs while Impella in place  Richardine Service, PharmD PGY1 Pharmacy Resident Phone: [phone] Nov 29, 2018  12:55 PM  Please check [email or web address] for unit-specific pharmacy phone numbers.

## 2018-12-10 NOTE — Progress Notes (Signed)
eLink Physician-Brief Progress Note Patient Name: Karina Tran DOB: 07-Sep-1940 MRN: [id number]   Date of Service  2018/12/04  HPI/Events of Note  Notified of ABG 7.22/19/143 oligoanuric. Received contrast during Impella placement. Decreasing pressor requirement but continues to be on norepinephrine and vasopressin  eICU Interventions   Will gently hydrate with D5 bicarb at 20 cc/hr which should also be beneficial due to possible contrast nephropathy     Intervention Category Major Interventions: Acid-Base disturbance - evaluation and management  Judd Lien 12-04-18, 5:02 AM

## 2018-12-10 NOTE — Progress Notes (Signed)
Dr. Haroldine Laws at bedside, extubated patient and patient passed peacefully at 17:53.  Family at bedside and emotional support given.  CDS notified & family will contact patient placement with name of funeral home. Chance Munter, Ardeth Sportsman  40 cc of Versed wasted in sink witnessed by 2 RNs, myself & Ardeen Jourdain, Ardeth Sportsman

## 2018-12-10 NOTE — Progress Notes (Signed)
  I came to check on patient and she remained unresponsive on vent. SBP 80s on max dose pressors.   Family has decided on move to comfort care.   I gave her fentanyl and versed to ensure comfort.   We then turned off all the drops and I personally extubated her. She died approximately 5 mins later with family at the bedside. Time of death 4. Dr. Einar Gip notified personally.   Additional CCT 35 mins.  Glori Bickers, MD  6:01 PM

## 2018-12-10 NOTE — Progress Notes (Signed)
Spoke with Dr. Einar Gip at 0132, 0139, 0209, and at 0609 regarding patient status, suction alarm events and interventions, titrations of Levo, and pressure issues resulting from interventions.  Notified Echo tech of stat echo ordered, awaiting arrival.  Results of xray given to Dr. Einar Gip at 0209, aware of position of PA catheter.  Echo tech hear at [phone] to perform stat Echo.

## 2018-12-10 NOTE — Progress Notes (Signed)
NAME:  Karina Tran, MRN:  [id number], DOB:  1940/10/23, LOS: 1 ADMISSION DATE:  11/29/2018, CONSULTATION DATE:  11/26/2018 REFERRING MD:  Dr. Roslynn Amble, CHIEF COMPLAINT:  Shock   Brief History   78 year old female admitted with shock after failed chemical/electrical cardioversion for atrial fib.   History of present illness   78 year old female with PMH as below, which is significant for atrial fibrillation s/p ablation and is managed with flecainide, Pulmonary fibrosis (not on home O2), and CHF with known EF 45% and grade 2 DD. Also hx of remote breast Ca s/p mastectomy and radiation. She was in her usual state of health until 2 am on 8/4 when she noticed SOB, chest pressure, nausea, and palpitations. These conditions worsened prompting her to present to the ED later that day. Upon arrival to ED she was found to be in rapid AF with rates in the 190s. This was refractory to adenosine x 3 and cardioversion x 2. She was started on diltiazem and treated with atenolol and flecainide. Her BP was OK at that point, but soon after it declined requiring vasopressors. Despite 15 mcg levophed she remained profoundly hypotensive. PCCM asked to admit.   Upon my arrival she complains of pain all over, can't get comfortable, SOB. Denies fever/chills, cough.    Past Medical History   has a past medical history of Allergic rhinitis, Breast cancer ([place]), Dyspnea, Dysrhythmia, GERD (gastroesophageal reflux disease), H/O asbestos exposure, History of GI bleed, History of kidney infection, History of pulmonary hypertension, HLD (hyperlipidemia), Hypertension, ILD (interstitial lung disease) ([place]), Iron deficiency anemia, Persistent atrial fibrillation, Raynaud phenomenon, and Spinal stenosis.   Significant Hospital Events   8/4 present AF, cardiovert, chemical cardiovert, decompensated requiring pressors. Impella.   Consults:  Cardiology  Procedures:  CVL 8/4 > Art line 8/4 >   Significant Diagnostic Tests:   CT head 8/4 >  Micro Data:  COVID 8/4 neg  Antimicrobials:  N/A  Interim history/subjective:  Cardiac arrest this AM  Objective   Blood pressure (!) 50/26, pulse 86, temperature 99.1 F (37.3 C), resp. rate (!) 26, height 5\' 4"  (1.626 m), weight 77.6 kg, SpO2 (!) 88 %. PAP: (16-24)/(10-17) 22/15 CVP:  [14 mmHg-17 mmHg] 15 mmHg  Vent Mode: PRVC FiO2 (%):  [50 %-100 %] 60 % Set Rate:  [26 bmp-35 bmp] 35 bmp Vt Set:  [430 mL-500 mL] 430 mL PEEP:  [5 cmH20-8 cmH20] 8 cmH20 Plateau Pressure:  [19 cmH20-21 cmH20] 21 cmH20   Intake/Output Summary (Last 24 hours) at 11-28-2018 0917 Last data filed at 11/28/18 0900 Gross per 24 hour  Intake 1664.02 ml  Output 0 ml  Net 1664.02 ml   Filed Weights   12/04/2018 1813 2018-11-28 0403  Weight: 68 kg 77.6 kg    Examination: General: Acutely ill appearing female HENT: [place]/AT, PERRL, EOM-I and MMM, ETT in place Lungs: Coarse BS diffusely Cardiovascular: RRR, Nl S1/S2 and -M/R/G Abdomen: Soft, NT, ND and +BS Extremities: No acute deformity or ROM limitation. Cool distal extremities.  Neuro: Unresponsive  I reviewed CXR myself, ETT is in a good position  Resolved Hospital Problem list     Assessment & Plan:   Cardiogenic shock: etiology unclear. BP was OK upon arrival, but after treatment for rapid AF she deteriorated. Did not respond to glucagon. Hypotensive despite 7mcg norepinephrine. Bedside echo by Dr Einar Gip demonstrates severely reduced LVEF.  Of note, something similar to this happened at [facility] in 2019 Acute on chronic  CHF ? NSTEMI - Impella in place - Pressors ordered - Add epi drip - Continue norepinephrine to keep MAP > 50mmHg - KVO IVF - Holding home antihypertensives, diuretics.   Atrial fibrillation: RVR on presentation but improved after cardioversion x 2 then diltiazem, atenolol, and flecainide. QTC prolonged to 630 so no amio given. - Xarelto dose given 8/4 - Holding home atenolol, flecainide.  - Heparin per  pharmacy  Acute encephalopathy: in the setting of shock. Did have one seizure like episode in ED when BP was at its lowest. No post ictal period or recurrence.  - Will CT head to ensure no intracranial issue considering seizure, xarelto.  - Supportive care. Improved with BP support.   AKI  In the setting of shock. At risk of worsening with planned trip to cath lab.  Hypomagnesemia Metabolic acidosis - Replace electrolytes as indicated - Bicarb drip - Follow BMP  Acute hypoxemic respiratory failure - Adjust vent for ABG - Hold lasix - Low threshold to intubate.   Best practice:  Diet: NPO Pain/Anxiety/Delirium protocol (if indicated): NA VAP protocol (if indicated): NA DVT prophylaxis: xarelto given 8/4 GI prophylaxis: PPI Glucose control: NA Mobility: BR Code Status: FULL Family Communication: Daughter updated over phone. Speech slurring may have been intoxicated. Disposition: ICU    Labs   CBC: Recent Labs  Lab 11/29/2018 1126 21-Nov-2018 0037 2018-11-21 0049 11-21-2018 0435 Nov 21, 2018 0506 21-Nov-2018 0651  WBC 9.6  --  18.2*  --   --  23.0*  NEUTROABS 7.8*  --   --   --   --   --   HGB 13.7 14.3 13.4 15.3* 14.6 13.3  HCT 42.9 42.0 42.7 45.0 43.0 43.9  MCV 96.2  --  100.9*  --   --  103.8*  PLT 170  --  144*  --   --  123*    Basic Metabolic Panel: Recent Labs  Lab 11/27/2018 1133 11/30/2018 1536 21-Nov-2018 0037 21-Nov-2018 0049 2018/11/21 0435 21-Nov-2018 0506 21-Nov-2018 0651  NA 135  --  141 141 142 141 141  K 4.0  --  4.8 4.7 4.9 5.0 4.8  CL 97*  --   --  110  --  110 113*  CO2 18*  --   --  7*  --   --  <7*  GLUCOSE 187*  --   --  51*  --  <20* 56*  BUN 8  --   --  10  --  10 9  CREATININE 1.40*  --   --  1.59*  --  1.60* 1.91*  CALCIUM 9.0  --   --  6.8*  --   --  6.5*  MG  --  1.6*  --  2.2  --   --  2.1  PHOS  --   --   --  7.1*  --   --  6.8*   GFR: Estimated Creatinine Clearance: 24.1 mL/min (A) (by C-G formula based on SCr of 1.91 mg/dL (H)). Recent Labs  Lab  11/14/2018 1126 2018/11/21 0049 2018/11/21 0126 2018/11/21 0651 21-Nov-2018 0745  WBC 9.6 18.2*  --  23.0*  --   LATICACIDVEN  --   --  >11.0*  --  >11.0*    Liver Function Tests: Recent Labs  Lab 11/27/2018 1133  AST 50*  ALT 37  ALKPHOS 72  BILITOT 2.7*  PROT 6.1*  ALBUMIN 3.3*   No results for input(s): LIPASE, AMYLASE in the last 168 hours. No results for input(s):  AMMONIA in the last 168 hours.  ABG    Component Value Date/Time   PHART 7.222 (L) Dec 02, 2018 0435   PCO2ART 19.1 (LL) 2018/12/02 0435   PO2ART 144.0 (H) 2018/12/02 0435   HCO3 7.8 (L) 2018-12-02 0435   TCO2 10 (L) 12-02-2018 0506   ACIDBASEDEF 17.0 (H) 2018-12-02 0435   O2SAT 99.0 12/02/18 0435     Coagulation Profile: No results for input(s): INR, PROTIME in the last 168 hours.  Cardiac Enzymes: No results for input(s): CKTOTAL, CKMB, CKMBINDEX, TROPONINI in the last 168 hours.  HbA1C: Hgb A1c MFr Bld  Date/Time Value Ref Range Status  12/23/2017 09:01 AM 5.4 4.8 - 5.6 % Final    Comment:    (NOTE)         Prediabetes: 5.7 - 6.4         Diabetes: >6.4         Glycemic control for adults with diabetes: <7.0     CBG: Recent Labs  Lab 2018-12-02 0521  GLUCAP 254*    Review of Systems:   negative except for as outlined in HPI  The patient is critically ill with multiple organ systems failure and requires high complexity decision making for assessment and support, frequent evaluation and titration of therapies, application of advanced monitoring technologies and extensive interpretation of multiple databases.   Critical Care Time devoted to patient care services described in this note is  90  Minutes. This time reflects time of care of this signee Dr Jennet Maduro. This critical care time does not reflect procedure time, or teaching time or supervisory time of PA/NP/Med student/Med Resident etc but could involve care discussion time.  Rush Farmer, M.D. [facility] Pulmonary/Critical Care Medicine.  Pager: [phone]. After hours pager: [phone].

## 2018-12-10 NOTE — Progress Notes (Signed)
Dr. Einar Gip at bedside, patient's HR in 40's, followed by pauses and asystole. CPR initiated and Code Blue called. See Code sheet. [place], Ardeth Sportsman

## 2018-12-10 NOTE — Progress Notes (Signed)
   2018/11/25 0824  Clinical Encounter Type  Visited With Patient;Health care provider  Visit Type Follow-up;Code  Referral From Nurse  Consult/Referral To Chaplain  This chaplain responded to Pt. Code Blue.  The chaplain was pastorally present with Pt. and healthcare team.  The chaplain understands from the RN no family present at this time.  The chaplain is available as needed for F/U spiritual care.

## 2018-12-10 NOTE — Progress Notes (Addendum)
Intubated and sedated.  Intake/Output from previous day:  I/O last 3 completed shifts: In: 1654.9 [I.V.:1031.1; Other:73.8; IV Piggyback:550] Out: 0  No intake/output data recorded.  Blood pressure (!) 50/26, pulse 86, temperature 99.1 F (37.3 C), resp. rate (!) 26, height _0  (1.626 m), weight 77.6 kg, SpO2 (!) 88 %. Physical Exam  Lab Results: BMP BNP (last 3 results) Recent Labs    11/11/2018 1126 2018/12/12 0050  BNP 3,846.4* 3,546.6*    ProBNP (last 3 results) No results for input(s): PROBNP in the last 8760 hours. BMP Latest Ref Rng & Units December 12, 2018 12/12/2018 12-12-2018  Glucose 70 - 99 mg/dL 56(L) <20(LL) -  BUN 8 - 23 mg/dL 9 10 -  Creatinine 0.44 - 1.00 mg/dL 1.91(H) 1.60(H) -  Sodium 135 - 145 mmol/L 141 141 142  Potassium 3.5 - 5.1 mmol/L 4.8 5.0 4.9  Chloride 98 - 111 mmol/L 113(H) 110 -  CO2 22 - 32 mmol/L <7(L) - -  Calcium 8.9 - 10.3 mg/dL 6.5(L) - -   Hepatic Function Latest Ref Rng & Units 12/03/2018 01/01/2018 12/31/2017  Total Protein 6.5 - 8.1 g/dL 6.1(L) 5.9(L) 6.0(L)  Albumin 3.5 - 5.0 g/dL 3.3(L) 3.0(L) 3.2(L)  AST 15 - 41 U/L 50(H) 40 53(H)  ALT 0 - 44 U/L 37 285(H) 438(H)  Alk Phosphatase 38 - 126 U/L 72 77 74  Total Bilirubin 0.3 - 1.2 mg/dL 2.7(H) 1.5(H) 1.4(H)  Bilirubin, Direct 0.0 - 0.2 mg/dL - - -   CBC Latest Ref Rng & Units 2018/12/12 12/12/18 12-Dec-2018  WBC 4.0 - 10.5 K/uL 23.0(H) - -  Hemoglobin 12.0 - 15.0 g/dL 13.3 14.6 15.3(H)  Hematocrit 36.0 - 46.0 % 43.9 43.0 45.0  Platelets 150 - 400 K/uL 123(L) - -   Lipid Panel     Component Value Date/Time   CHOL 144 03/10/2015 0446   TRIG 37 03/10/2015 0446   HDL 68 03/10/2015 0446   CHOLHDL 2.1 03/10/2015 0446   VLDL 7 03/10/2015 0446   LDLCALC 69 03/10/2015 0446   Cardiac Panel (last 3 results) No results for input(s): CKTOTAL, CKMB, TROPONINI, RELINDX in the last 72 hours.  HEMOGLOBIN A1C Lab Results  Component Value Date   HGBA1C 5.4 12/23/2017   MPG 108 12/23/2017    TSH Recent Labs    December 12, 2018 0126  TSH 5.101*   Imaging: Imaging results have been reviewed      Scheduled Meds: . fentaNYL (SUBLIMAZE) injection  25 mcg Intravenous Once  . pantoprazole (PROTONIX) IV  40 mg Intravenous QHS  . sodium bicarbonate       Continuous Infusions: . sodium chloride    . epinephrine    . fentaNYL infusion INTRAVENOUS 50 mcg/hr (2018-12-12 0600)  . impella catheter heparin 50 unit/mL in dextrose 5%    . heparin Stopped (2018/12/12 0116)  . midazolam 2 mg/hr (12/12/2018 0600)  . norepinephrine (LEVOPHED) Adult infusion 58 mcg/min (2018-12-12 0600)  .  sodium bicarbonate  infusion 1000 mL 20 mL/hr at 12-12-2018 0600  . vasopressin (PITRESSIN) infusion - *FOR SHOCK* 0.03 Units/min (December 12, 2018 0000)   PRN Meds:.fentaNYL, midazolam Troponin I (High Sensitivity)  1,296High Panic   60High  CM  48High  CM  44High  CM   Comment: CRITICAL RESULT CALLED TO, READ BACK BY AND VERIFIED WITH:     Assessment/Plan:  1. 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 Acuity of onset does not appear to be solely related to chronic flecainide or one dose Atenolol. Patient's presentation  now more consistent with cardiogenic shock due to stress cardiomyopathy.  2. 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  Rec: Patient this morning has suction alarm, Impella positioned under Echo guidance.  Out put improved, but again had alarm and while doing echo has asystole and needed CPR.  PCCM and Linna Hoff Bensimhon present.   I spoke to her daughter Maryjean Morn and gave her an update. Patient will likely not survive, no UOP and metabolic acidosis and EF 85%. RV function mild to moderately reduced but stable and age 34 years.   Patient's daughter is aware of the critical nature and is on her way. I have communicated with the clinical staff and [facility] check in to allow her to come in for a visit.   Dr. Pierre Bali is aware that he now has the capacity to make her DNR if the hemodynamics and chemistry do not improve.  1 hour of critical care and coordination of care and family discussion.   Adrian Prows, MD, [facility] Dec 07, 2018, 8:37 AM [facility] [place] Pager: [phone] Office: [phone] If no answer Cell [phone]

## 2018-12-10 NOTE — Consult Note (Addendum)
Advanced Heart Failure Team Consult Note   Primary Physician: Jettie Booze, NP PCP-Cardiologist:  No primary care provider on file.  Reason for Consultation: Cardiogenic Shock   HPI:    Karina Tran is seen today for evaluation of cardiogenic shock at the request of Dr Nadyne Coombes.   Karina Tran is a 78 year old with history of breast cancer, s/p mastectomy with radiation,pulmonary fibrosis, A Fib s/p ablation, and GERD.   Presented to [facility] ED via EMS with increased sob, chest pain, nausea, and palpitations. EKG showed A fib RVR . Given adenosine x3 and diltiazem bolus with no change. Given dose flecanide but remained in A fib. Cardioversion  was performed x2. She then developed hypotension was started on pressors. PCCM admitted.   ECHO completed EKG changes concerning for ischemia. Some concern for seizure activity. Taken urgently to the cath and impella placed. Later required urgent intubation.  Continued to require increased pressors.Lactic Acid >11 Continued to struggle overnight with suction events. VF arrest requiring CPR ~5 minutes, Shock x1. Started on epi drip and continued on norepi 60 mcg + vasopressin drip+ bicarb.  Impella repositioned at bedside. P5 with Flow 2.6/ CO 1.7 Cardiac Power 0.4    Echo performed at bedside with Dr Haroldine Laws LVEF ~10% RV severely reduced.   Review of Systems: [y] = yes, [ ]  = no  Patient is encephalopathic and or intubated. Therefore history has been obtained from chart review.    General: Weight gain [ ] ; Weight loss [ ] ; Anorexia [ ] ; Fatigue [ ] ; Fever [ ] ; Chills [ ] ; Weakness [ ]    Cardiac: Chest pain/pressure [ ] ; Resting SOB [ ] ; Exertional SOB [ ] ; Orthopnea [ ] ; Pedal Edema [ ] ; Palpitations [ ] ; Syncope [ ] ; Presyncope [ ] ; Paroxysmal nocturnal dyspnea[ ]    Pulmonary: Cough [ ] ; Wheezing[ ] ; Hemoptysis[ ] ; Sputum [ ] ; Snoring [ ]    GI: Vomiting[ ] ; Dysphagia[ ] ; Melena[ ] ; Hematochezia [ ] ; Heartburn[ ] ; Abdominal pain [ ] ;  Constipation [ ] ; Diarrhea [ ] ; BRBPR [ ]    GU: Hematuria[ ] ; Dysuria [ ] ; Nocturia[ ]    Vascular: Pain in legs with walking [ ] ; Pain in feet with lying flat [ ] ; Non-healing sores [ ] ; Stroke [ ] ; TIA [ ] ; Slurred speech [ ] ;   Neuro: Headaches[ ] ; Vertigo[ ] ; Seizures[ ] ; Paresthesias[ ] ;Blurred vision [ ] ; Diplopia [ ] ; Vision changes [ ]    Ortho/Skin: Arthritis [ ] ; Joint pain [ ] ; Muscle pain [ ] ; Joint swelling [ ] ; Back Pain [ ] ; Rash [ ]    Psych: Depression[ ] ; Anxiety[ ]    Heme: Bleeding problems [ ] ; Clotting disorders [ ] ; Anemia [ ]    Endocrine: Diabetes [ ] ; Thyroid dysfunction[ ]   Home Medications Prior to Admission medications   Medication Sig Start Date End Date Taking? Authorizing Provider  amLODipine (NORVASC) 5 MG tablet Take 5 mg by mouth daily.   Yes [provider]  atenolol (TENORMIN) 50 MG tablet Take 50 mg by mouth daily.   Yes [provider]  Calcium Carb-Cholecalciferol (CALCIUM 600+D3 PO) Take 1 tablet by mouth every morning.    Yes [provider]  Coenzyme Q10 (CO Q10) 200 MG CAPS Take 200 mg by mouth every morning.    Yes [provider]  flecainide (TAMBOCOR) 50 MG tablet Take 1 tablet (50 mg total) by mouth every 12 (twelve) hours. 06/18/18  Yes Adrian Prows, MD  hydrochlorothiazide (HYDRODIURIL) 25 MG  tablet Take 25 mg by mouth daily.   Yes [provider]  Multiple Vitamins-Minerals (MULTIVITAMIN ADULT) CHEW Chew 2 each by mouth daily.   Yes [provider]  pantoprazole (PROTONIX) 40 MG tablet Take 40 mg by mouth every morning.    Yes [provider]  POTASSIUM GLUCONATE PO Take 75 mg by mouth daily.    Yes [provider]  rivaroxaban (XARELTO) 20 MG TABS tablet Take 1 tablet (20 mg total) by mouth daily with supper. Patient taking differently: Take 20 mg by mouth every morning.  11/18/14  Yes Neldon Labella, NP  docusate sodium (COLACE) 100 MG capsule Take 1 capsule (100 mg  total) by mouth 2 (two) times daily. Patient not taking: Reported on 11/15/2018 05/10/18   Danae Orleans, PA-C  ferrous sulfate (FERROUSUL) 325 (65 FE) MG tablet Take 1 tablet (325 mg total) by mouth 3 (three) times daily with meals. Patient not taking: Reported on 11/10/2018 05/10/18   Danae Orleans, PA-C    Past Medical History: Past Medical History:  Diagnosis Date   Allergic rhinitis    Breast cancer ([place])    left   Dyspnea    chronic with exertion    Dysrhythmia    a fib  had ablation in [place]    GERD (gastroesophageal reflux disease)    H/O asbestos exposure    History of GI bleed    last episode 02-02-16; was due to hemorroids    History of kidney infection    History of pulmonary hypertension    mild ; (see ECHO result in LOV with Dr. Kela Millin 06-18-17 on chart )  pt. denies at preop   HLD (hyperlipidemia)    denies    Hypertension    ILD (interstitial lung disease) ([place])    Iron deficiency anemia    gets iv infusions   Persistent atrial fibrillation    a. s/p PVI 2009 in [place]   Raynaud phenomenon    no issues currently    Spinal stenosis    lumbar     Past Surgical History: Past Surgical History:  Procedure Laterality Date   ABLATION  2009   PVI in [place]   afib ablation  10/06/2010   APPENDECTOMY     BREAST LUMPECTOMY Left 2007   malignant   BREAST SURGERY Left 2007   partial mastectomy and lumpectomy    CARDIOVERSION N/A 11/18/2014   Procedure: CARDIOVERSION;  Surgeon: Adrian Prows, MD;  Location: [facility];  Service: Cardiovascular;  Laterality: N/A;   carpel tunnel     CESAREAN SECTION     GANGLION CYST EXCISION     x2   HEMORRHOID SURGERY     KYPHOPLASTY  2017   RIGHT/LEFT HEART CATH AND CORONARY ANGIOGRAPHY N/A 11/22/2018   Procedure: RIGHT/LEFT HEART CATH AND CORONARY ANGIOGRAPHY;  Surgeon: Adrian Prows, MD;  Location: [facility];  Service: Cardiovascular;  Laterality: N/A;   TONSILLECTOMY     TOTAL HIP  ARTHROPLASTY Left 07/03/2017   Procedure: LEFT TOTAL HIP ARTHROPLASTY ANTERIOR APPROACH;  Surgeon: Paralee Cancel, MD;  Location: [facility];  Service: Orthopedics;  Laterality: Left;  70 mins   TOTAL HIP ARTHROPLASTY Right 05/09/2018   Procedure: TOTAL HIP ARTHROPLASTY ANTERIOR APPROACH;  Surgeon: Paralee Cancel, MD;  Location: [facility];  Service: Orthopedics;  Laterality: Right;  70 minutes   VENTRICULAR ASSIST DEVICE INSERTION N/A 12/08/2018   Procedure: VENTRICULAR ASSIST DEVICE INSERTION;  Surgeon: Adrian Prows, MD;  Location: [facility];  Service: Cardiovascular;  Laterality: N/A;    Family History: Family History  Problem Relation Age of Onset   Lung cancer Sister    Heart failure Mother    Brain cancer Father    Lung disease Neg Hx    Rheumatologic disease Neg Hx    Colon cancer Neg Hx    Colon polyps Neg Hx     Social History: Social History   Socioeconomic History   Marital status: Widowed    Spouse name: Not on file   Number of children: Not on file   Years of education: Not on file   Highest education level: Not on file  Occupational History   Not on file  Social Needs   Financial resource strain: Not on file   Food insecurity    Worry: Not on file    Inability: Not on file   Transportation needs    Medical: Not on file    Non-medical: Not on file  Tobacco Use   Smoking status: Passive Smoke Exposure - Never Smoker   Smokeless tobacco: Never Used   Tobacco comment: exposed to second-hand smoke her entire life  Substance and Sexual Activity   Alcohol use: Yes    Comment: social ETOH, beer    Drug use: No   Sexual activity: Not Currently  Lifestyle   Physical activity    Days per week: Not on file    Minutes per session: Not on file   Stress: Not on file  Relationships   Social connections    Talks on phone: Not on file    Gets together: Not on file    Attends religious service: Not on file    Active member of club or  organization: Not on file    Attends meetings of clubs or organizations: Not on file    Relationship status: Not on file  Other Topics Concern   Not on file  Social History Narrative   Live with daughter. Retired.       [facility]:   Originally from [place]. Moved to [place] in 2015. Living with daughter & son-in-law. Remote travel to [place]. 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 No pets currently. Remote parakeet exposure. No mold exposure. Previously enjoyed square dancing and now plays on her computer. Also previously enjoyed camping in her camper but has since sold it.     Allergies:  Allergies  Allergen Reactions   Aspirin Other (See Comments)    Bloody noses   Blue Dyes (Parenteral) Nausea And Vomiting   Demerol [Meperidine] Nausea And Vomiting    Objective:    Vital Signs:   Temp:  [95.9 F (35.5 C)-99.1 F (37.3 C)] 99.1 F (37.3 C) (08/05 0445) Pulse Rate:  [33-148] 86 (08/05 0422) Resp:  [14-42] 26 (08/05 0445) BP: (43-149)/(22-119) 50/26 (08/05 0315) SpO2:  [67 %-100 %] 88 % (08/04 2300) Arterial Line BP: (83-105)/(78-99) 94/85 (08/05 0445) FiO2 (%):  [50 %-100 %] 60 % (08/05 0822) Weight:  [68 kg-77.6 kg] 77.6 kg (08/05 0403) Last BM Date: (PTA)  Weight change: Filed Weights   12/06/2018 1813 12-09-18 0403  Weight: 68 kg 77.6 kg    Intake/Output:   Intake/Output Summary (Last 24 hours) at 12/09/2018 0826 Last data filed at 12-09-18 0700 Gross per 24 hour  Intake 1654.92 ml  Output 0 ml  Net 1654.92 ml      Physical Exam   CVP 21 General:  Intubated unresponsive.  Mottled HEENT: ETT Neck: supple. JVP elevated. Carotids 2+ bilat; no bruits. No lymphadenopathy or thyromegaly appreciated. Cor: PMI nondisplaced. Irregular rate & rhythm. + impella hum Lungs: coarse Abdomen: soft, nontender, nondistended. No hepatosplenomegaly. No bruits or masses. Good bowel  sounds. Extremities: cold,  + cyanosis, no edema Neuro: intubated   Telemetry   A fib 120-130  Personally reviewed   Labs   Basic Metabolic Panel: Recent Labs  Lab 11/11/2018 1133 12/03/2018 1536 11/21/2018 0037 11-21-2018 0049 November 21, 2018 0435 21-Nov-2018 0506 Nov 21, 2018 0651  NA 135  --  141 141 142 141 141  K 4.0  --  4.8 4.7 4.9 5.0 4.8  CL 97*  --   --  110  --  110 113*  CO2 18*  --   --  7*  --   --  <7*  GLUCOSE 187*  --   --  51*  --  <20* 56*  BUN 8  --   --  10  --  10 9  CREATININE 1.40*  --   --  1.59*  --  1.60* 1.91*  CALCIUM 9.0  --   --  6.8*  --   --  6.5*  MG  --  1.6*  --  2.2  --   --  2.1  PHOS  --   --   --  7.1*  --   --  6.8*    Liver Function Tests: Recent Labs  Lab 12/02/2018 1133  AST 50*  ALT 37  ALKPHOS 72  BILITOT 2.7*  PROT 6.1*  ALBUMIN 3.3*   No results for input(s): LIPASE, AMYLASE in the last 168 hours. No results for input(s): AMMONIA in the last 168 hours.  CBC: Recent Labs  Lab 11/14/2018 1126 11-21-18 0037 21-Nov-2018 0049 11/21/18 0435 21-Nov-2018 0506 11-21-2018 0651  WBC 9.6  --  18.2*  --   --  23.0*  NEUTROABS 7.8*  --   --   --   --   --   HGB 13.7 14.3 13.4 15.3* 14.6 13.3  HCT 42.9 42.0 42.7 45.0 43.0 43.9  MCV 96.2  --  100.9*  --   --  103.8*  PLT 170  --  144*  --   --  123*    Cardiac Enzymes: No results for input(s): CKTOTAL, CKMB, CKMBINDEX, TROPONINI in the last 168 hours.  BNP: BNP (last 3 results) Recent Labs    11/17/2018 1126 11/21/2018 0050  BNP 3,846.4* 3,546.6*    ProBNP (last 3 results) No results for input(s): PROBNP in the last 8760 hours.   CBG: Recent Labs  Lab Nov 21, 2018 0521  GLUCAP 254*    Coagulation Studies: No results for input(s): LABPROT, INR in the last 72 hours.   Imaging   Dg Chest Port 1 View  Result Date: 11/21/2018 CLINICAL DATA:  Intubation.  Impella catheter placement. EXAM: PORTABLE CHEST 1 VIEW COMPARISON:  11-21-18.  12/26/2017. FINDINGS: Endotracheal tube in stable  position. Swan-Ganz catheter in unchanged position with tip in a distal right upper lobe pulmonary artery. Impella catheter noted in unchanged position. Stable cardiomegaly. Low lung volumes with bibasilar atelectasis. Persistent infiltrate right upper lung and left lung base. Chronic interstitial changes. Small bilateral pleural effusions cannot be excluded. No pneumothorax. IMPRESSION: 1. Endotracheal tube in stable position. Swan-Ganz catheter in unchanged position with tip in a distal right upper lobe pulmonary artery. Impella device in unchanged position. 2.  Persistent severe cardiomegaly. 3. Low lung volumes with bibasilar atelectasis. Persistent infiltrate  right upper lung and left lung base. Chronic interstitial changes. Small bilateral pleural effusions cannot be excluded. Electronically Signed   By: Marcello Moores  Register   On: 11-30-18 08:02   Portable Chest X-ray  Result Date: November 30, 2018 CLINICAL DATA:  ET tube placement EXAM: PORTABLE CHEST 1 VIEW COMPARISON:  11/26/2018 FINDINGS: Endotracheal tube is 3 cm above the carina. Swan-Ganz catheter is deep into a right upper lobe pulmonary artery. Impella device tip in the left ventricle. Cardiomegaly, vascular congestion. Left lower lobe atelectasis or infiltrate. Patchy opacity in the right upper lobe, new since prior study. No acute bony abnormality. IMPRESSION: Cardiomegaly, vascular congestion. Left lower lobe and right mid lung atelectasis or infiltrates. Support devices as above. Swan-Ganz catheter tip deep in the right upper lobe pulmonary artery. Electronically Signed   By: Rolm Baptise M.D.   On: 2018/11/30 00:24   Dg Chest Portable 1 View  Result Date: 11/16/2018 CLINICAL DATA:  Shortness of breath EXAM: PORTABLE CHEST 1 VIEW COMPARISON:  November 12, 2018 FINDINGS: The heart size remains enlarged. There are worsening bilateral pleural effusions and worsening vascular congestion. There is no pneumothorax. There is a persistent opacity at the left  lung base. There is no acute osseous abnormality. IMPRESSION: 1. Cardiomegaly with worsening bilateral pleural effusions and vascular congestion. 2. Persistent bibasilar airspace opacities, greatest at the left lung base. Differential considerations include atelectasis or infiltrate. Electronically Signed   By: Constance Holster M.D.   On: 11/29/2018 19:43   Dg Chest Portable 1 View  Result Date: 11/19/2018 CLINICAL DATA:  Shortness of breath. EXAM: PORTABLE CHEST 1 VIEW COMPARISON:  Chest radiograph 12/30/2017, FINDINGS: Cardiomegaly. Bibasilar opacities and blunting of the bilateral lateral costophrenic angles, more conspicuous on the left as compared to prior examination 12/30/2017. Findings likely reflect a combination of small bilateral pleural effusions with bibasilar atelectasis. A left lower lobe pneumonia is difficult to exclude. Chronic prominence of the interstitial lung markings. No acute bony abnormality. Thoracic dextrocurvature. IMPRESSION: Cardiomegaly. Bilateral pleural effusions with bibasilar atelectasis again demonstrated. Opacity at the left lung base is increased from prior examination 12/30/2017, and a left lower lobe pneumonia is difficult to exclude. Electronically Signed   By: Kellie Simmering   On: 11/21/2018 11:50      Medications:     Current Medications:  fentaNYL (SUBLIMAZE) injection  25 mcg Intravenous Once   pantoprazole (PROTONIX) IV  40 mg Intravenous QHS   sodium bicarbonate       sodium bicarbonate         Infusions:  sodium chloride     fentaNYL infusion INTRAVENOUS 50 mcg/hr (2018-11-30 0600)   impella catheter heparin 50 unit/mL in dextrose 5%     heparin Stopped (2018-11-30 0116)   midazolam 2 mg/hr (2018-11-30 0600)   norepinephrine (LEVOPHED) Adult infusion 58 mcg/min (November 30, 2018 0600)    sodium bicarbonate  infusion 1000 mL 20 mL/hr at Nov 30, 2018 0600   vasopressin (PITRESSIN) infusion - *FOR SHOCK* 0.03 Units/min (11/30/18 0000)          Assessment/Plan   1. Cardiogenic Shock 2. V Fib  Arrest  CPR ~ 5 min , DC-CV x1 3. Acute Hypoxic Respiratory Failure 4. Paroxysmal A Fib RVR   5. 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 H/O breast cancer.    Length of Stay: [place], NP  2018-11-30, 8:26 AM  Advanced Heart Failure Team Pager [phone] (M-F; 7a - 4p)  Please contact [facility] for night-coverage after hours (4p -7a ) and  weekends on [email or web address]  Agree with above.   78 y/o with pulmonary fibrosis, chronic AF admitted last night with recurrent AF. Failed DC-CV.  Developed cardiogenic shock and arrested. Taken to cath lab with normal cors but profound shock (MV sat 28%) with near LV standstill. Intubated and Impella placed.  Markedly acidotic overnight despite bicarb gtt. This am developed PEA arrest and CPR initiated. Myself, Dr. Einar Gip and Dr. Nelda Marseille at bedside. Then VF. Shocked x 1.   Impella readjusted personally under echo guidance. LVEF ~20% RV moderately down.  Able to achieve ROSC but remained quite hypotensive and mottled. Epi gtt started. Swan repositioned under fluoro guidance.   Flows on impella now 3.2 on P-7. Good waveforms   Exam  Critically ill appearing On vent cyanotic  Cor IRR + impella hume Lungs coarse Ab obese NT Ext mottled cool. NT Impella LFA   She is critically ill with severe cardiogenic shock. Now with Impella support. Had code blue again this am. Given age and comorbidities not candidate for ECMO or upgrade in support. Will continue to titrate drips and attempt to optimize hemodynamics as much as possible however I do not think she can survive this.  CCT time 120 minutes. (not including impella adjustment)  Glori Bickers, MD  9:29 AM

## 2018-12-10 NOTE — Progress Notes (Signed)
  Patients daughter and son-in-law at bedside.   Patient now on 14 mcg or EP, 60 mcg of NE and vasopressin. SBP in low 80s and dropping. Requiring frequent dose titrations and several bicarb pushes.   Impella working well 3.2L flow on p-7 with no suction alarms good waveforms. CPO 0.4.  I discussed situation with family at length and they were clear that she would not want to be kept alive by machines.   We will max pressors and follow.   If not responding will switch to comfort care. No shock, CPR at this point. Code status changed to Limited Code.   Additional 60mins CCT.   Glori Bickers, MD  10:07 AM

## 2018-12-10 NOTE — Procedures (Signed)
CPR Procedure Note  PEA arrest, see code sheet, epi, atropine, bicarb, calcium, D50 and insulin + one shock.  Rush Farmer, M.D. [facility]/Critical Care Medicine. Pager: [phone]. After hours pager: [phone].

## 2018-12-10 NOTE — Progress Notes (Signed)
RT called CCM at 0039 for critical on ABG.   Ref. Range 11-16-2018 00:37  Sample type Unknown ARTERIAL  pH, Arterial Latest Ref Range: 7.350 - 7.450  7.276 (L)  pCO2 arterial Latest Ref Range: 32.0 - 48.0 mmHg 17.5 (LL)  pO2, Arterial Latest Ref Range: 83.0 - 108.0 mmHg 417.0 (H)  TCO2 Latest Ref Range: 22 - 32 mmol/L 9 (L)  Acid-base deficit Latest Ref Range: 0.0 - 2.0 mmol/L 16.0 (H)  Bicarbonate Latest Ref Range: 20.0 - 28.0 mmol/L 8.3 (L)  O2 Saturation Latest Units: % 100.0  Patient temperature Unknown 36.0 C  Collection site Unknown ARTERIAL LINE

## 2018-12-10 NOTE — Discharge Summary (Signed)
  Advanced Heart Failure Death Summary  Death Summary   Patient ID: Karina Tran MRN: [id number], DOB/AGE: [id number] 79 y.o. Admit date: 11/14/2018 D/C date:     2018/12/08   Primary Discharge Diagnoses:  1. Cardiogenic shock 2. Severe NICM 3. Cardiac arrest 4.Acute hypoxic respiratory failure 5. PAF 6. Pulmonary fibrosis  Hospital Course:  Karina Tran was a 78 y/o woman with h/o PAF, pulmonary fibrosis, breast cancer who has been followed by Dr. Einar Gip. She has had poor tolerance of AF and has been maintained in NSR on flecainide .   She presented to the ER on 8/4 with SOB and weakness. Found to be in rapid AF with rates in the 190s. This was refractory to adenosine x 3 and cardioversion x 2. She was started on diltiazem and treated with atenolol and flecainide. Her BP was OK at that point, but soon after it declined rapidly requiring vasopressors. Despite 15 mcg levophed she remained profoundly hypotensive. She was admitted by CCM and Dr. Einar Gip was consulted. Due to worsening shock and ECG changes she was taken to the cath lab emergently which showed normal coronary arteries with EF 20% and profound cardiogenic shock with near cardiac standstill felt due to rate-related vs stress cardiomyopathy. She was intubated and an Impella CP support device was successfully placed with temporary hemodynamic stabilization. The shock team was called emergently. She was not felt to be candidate for ECMO or other advanced therapies given age, biventricular dysfunction and comorbidities.   She was transferred to ICU. Overnight developed worsening shock and persistent lactic acidosis despite Impella support. EF on echo 20%. On the morning of 8/5 developed PEA arrest and required CPR and 1 shock for VF.   Throughout the day had progressive multi-system organ failure and hypotension despite max dose pressors. Family at the bedside and was clear that she would not want to be kept alive artificially. I  terminally extubated and she died a few minutes later at 35 with her family at the bedside.    Signed, Glori Bickers, MD 12/08/18, 6:01 PM

## 2018-12-10 NOTE — Progress Notes (Signed)
I responded to a [place] to provide spiritual support for the patient's family. I visited the patient's room with her daughter and son-in-law present. I shared words of encouragement and comfort and led in prayer. I shard that the Chaplain is available for additional support as needed or requested.    12-06-2018 1153  Clinical Encounter Type  Visited With Patient and family together  Visit Type Spiritual support  Referral From Nurse  Consult/Referral To Chaplain  Spiritual Encounters  Spiritual Needs Prayer;Emotional    Chaplain Dr Redgie Grayer

## 2018-12-10 NOTE — Progress Notes (Signed)
  Echocardiogram 2D Echocardiogram has been performed.  Karina Tran 11-15-2018, 7:57 AM

## 2018-12-10 DEATH — deceased

## 2019-02-09 NOTE — Progress Notes (Signed)
Entered chart for [employer].
